# Patient Record
Sex: Female | Born: 1960
Health system: Southern US, Community
[De-identification: ages and names within clinical notes are randomized; demographics above are authoritative.]

## PROBLEM LIST (undated history)

## (undated) DIAGNOSIS — R110 Nausea: Secondary | ICD-10-CM

## (undated) DIAGNOSIS — K5790 Diverticulosis of intestine, part unspecified, without perforation or abscess without bleeding: Secondary | ICD-10-CM

## (undated) DIAGNOSIS — G4700 Insomnia, unspecified: Secondary | ICD-10-CM

## (undated) DIAGNOSIS — M2240 Chondromalacia patellae, unspecified knee: Secondary | ICD-10-CM

## (undated) DIAGNOSIS — K635 Polyp of colon: Secondary | ICD-10-CM

## (undated) DIAGNOSIS — E119 Type 2 diabetes mellitus without complications: Secondary | ICD-10-CM

## (undated) DIAGNOSIS — IMO0002 Reserved for concepts with insufficient information to code with codable children: Secondary | ICD-10-CM

## (undated) DIAGNOSIS — D126 Benign neoplasm of colon, unspecified: Secondary | ICD-10-CM

## (undated) DIAGNOSIS — E063 Autoimmune thyroiditis: Secondary | ICD-10-CM

## (undated) DIAGNOSIS — M67919 Unspecified disorder of synovium and tendon, unspecified shoulder: Secondary | ICD-10-CM

## (undated) DIAGNOSIS — G43809 Other migraine, not intractable, without status migrainosus: Secondary | ICD-10-CM

## (undated) DIAGNOSIS — G2581 Restless legs syndrome: Secondary | ICD-10-CM

## (undated) DIAGNOSIS — K219 Gastro-esophageal reflux disease without esophagitis: Secondary | ICD-10-CM

## (undated) DIAGNOSIS — M719 Bursopathy, unspecified: Secondary | ICD-10-CM

## (undated) DIAGNOSIS — T4145XA Adverse effect of unspecified anesthetic, initial encounter: Secondary | ICD-10-CM

## (undated) DIAGNOSIS — M130 Polyarthritis, unspecified: Secondary | ICD-10-CM

## (undated) DIAGNOSIS — T8859XA Other complications of anesthesia, initial encounter: Secondary | ICD-10-CM

## (undated) DIAGNOSIS — R51 Headache: Secondary | ICD-10-CM

## (undated) DIAGNOSIS — R002 Palpitations: Secondary | ICD-10-CM

## (undated) DIAGNOSIS — R569 Unspecified convulsions: Secondary | ICD-10-CM

## (undated) DIAGNOSIS — R7689 Other specified abnormal immunological findings in serum: Secondary | ICD-10-CM

## (undated) DIAGNOSIS — R000 Tachycardia, unspecified: Secondary | ICD-10-CM

## (undated) DIAGNOSIS — M7710 Lateral epicondylitis, unspecified elbow: Secondary | ICD-10-CM

## (undated) DIAGNOSIS — K900 Celiac disease: Secondary | ICD-10-CM

## (undated) DIAGNOSIS — R112 Nausea with vomiting, unspecified: Secondary | ICD-10-CM

## (undated) DIAGNOSIS — Z973 Presence of spectacles and contact lenses: Secondary | ICD-10-CM

## (undated) DIAGNOSIS — E039 Hypothyroidism, unspecified: Secondary | ICD-10-CM

## (undated) DIAGNOSIS — Z9889 Other specified postprocedural states: Secondary | ICD-10-CM

## (undated) DIAGNOSIS — Z87891 Personal history of nicotine dependence: Secondary | ICD-10-CM

## (undated) DIAGNOSIS — E7800 Pure hypercholesterolemia, unspecified: Secondary | ICD-10-CM

## (undated) DIAGNOSIS — R768 Other specified abnormal immunological findings in serum: Secondary | ICD-10-CM

## (undated) DIAGNOSIS — E669 Obesity, unspecified: Secondary | ICD-10-CM

## (undated) DIAGNOSIS — K589 Irritable bowel syndrome without diarrhea: Secondary | ICD-10-CM

## (undated) DIAGNOSIS — D649 Anemia, unspecified: Secondary | ICD-10-CM

## (undated) DIAGNOSIS — I10 Essential (primary) hypertension: Secondary | ICD-10-CM

## (undated) DIAGNOSIS — K317 Polyp of stomach and duodenum: Secondary | ICD-10-CM

## (undated) HISTORY — DX: Polyp of stomach and duodenum: K31.7

## (undated) HISTORY — DX: Unspecified disorder of synovium and tendon, unspecified shoulder: M67.919

## (undated) HISTORY — DX: Anemia, unspecified: D64.9

## (undated) HISTORY — DX: Restless legs syndrome: G25.81

## (undated) HISTORY — PX: TONSILLECTOMY: SUR1361

## (undated) HISTORY — DX: Obesity, unspecified: E66.9

## (undated) HISTORY — DX: Bursopathy, unspecified: M71.9

## (undated) HISTORY — DX: Tachycardia, unspecified: R00.0

## (undated) HISTORY — DX: Chondromalacia patellae, unspecified knee: M22.40

## (undated) HISTORY — DX: Polyarthritis, unspecified: M13.0

## (undated) HISTORY — DX: Gastro-esophageal reflux disease without esophagitis: K21.9

## (undated) HISTORY — DX: Benign neoplasm of colon, unspecified: D12.6

## (undated) HISTORY — DX: Headache: R51

## (undated) HISTORY — DX: Other migraine, not intractable, without status migrainosus: G43.809

## (undated) HISTORY — DX: Diverticulosis of intestine, part unspecified, without perforation or abscess without bleeding: K57.90

## (undated) HISTORY — PX: ABDOMINOPLASTY: SUR9

## (undated) HISTORY — DX: Reserved for concepts with insufficient information to code with codable children: IMO0002

## (undated) HISTORY — DX: Lateral epicondylitis, unspecified elbow: M77.10

## (undated) HISTORY — DX: Palpitations: R00.2

## (undated) HISTORY — DX: Pure hypercholesterolemia, unspecified: E78.00

## (undated) HISTORY — DX: Hypothyroidism, unspecified: E03.9

## (undated) HISTORY — DX: Essential (primary) hypertension: I10

## (undated) HISTORY — DX: Insomnia, unspecified: G47.00

## (undated) HISTORY — DX: Personal history of nicotine dependence: Z87.891

## (undated) HISTORY — DX: Type 2 diabetes mellitus without complications: E11.9

## (undated) HISTORY — DX: Irritable bowel syndrome, unspecified: K58.9

---
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
## 1989-07-25 HISTORY — PX: REDUCTION MAMMAPLASTY: SUR839

## 1990-07-25 HISTORY — PX: BREAST BIOPSY: SHX20

## 1995-07-26 DIAGNOSIS — D126 Benign neoplasm of colon, unspecified: Secondary | ICD-10-CM

## 1995-07-26 HISTORY — DX: Benign neoplasm of colon, unspecified: D12.6

## 2001-05-17 ENCOUNTER — Encounter: Payer: Self-pay | Admitting: Infectious Diseases

## 2001-05-17 ENCOUNTER — Ambulatory Visit (HOSPITAL_COMMUNITY): Admission: RE | Admit: 2001-05-17 | Discharge: 2001-05-17 | Payer: Self-pay | Admitting: Infectious Diseases

## 2001-06-14 ENCOUNTER — Emergency Department (HOSPITAL_COMMUNITY): Admission: EM | Admit: 2001-06-14 | Discharge: 2001-06-14 | Payer: Self-pay | Admitting: Emergency Medicine

## 2001-06-20 ENCOUNTER — Emergency Department (HOSPITAL_COMMUNITY): Admission: EM | Admit: 2001-06-20 | Discharge: 2001-06-20 | Payer: Self-pay | Admitting: Emergency Medicine

## 2001-09-05 ENCOUNTER — Other Ambulatory Visit: Admission: RE | Admit: 2001-09-05 | Discharge: 2001-09-05 | Payer: Self-pay | Admitting: Obstetrics and Gynecology

## 2001-09-07 ENCOUNTER — Ambulatory Visit (HOSPITAL_COMMUNITY): Admission: RE | Admit: 2001-09-07 | Discharge: 2001-09-07 | Payer: Self-pay | Admitting: Gastroenterology

## 2001-09-07 ENCOUNTER — Encounter: Payer: Self-pay | Admitting: Gastroenterology

## 2001-12-23 HISTORY — PX: POLYPECTOMY: SHX149

## 2001-12-23 HISTORY — PX: COLONOSCOPY: SHX174

## 2001-12-23 HISTORY — PX: ESOPHAGOGASTRODUODENOSCOPY: SHX1529

## 2001-12-31 ENCOUNTER — Encounter (INDEPENDENT_AMBULATORY_CARE_PROVIDER_SITE_OTHER): Payer: Self-pay | Admitting: *Deleted

## 2001-12-31 ENCOUNTER — Ambulatory Visit (HOSPITAL_COMMUNITY): Admission: RE | Admit: 2001-12-31 | Discharge: 2001-12-31 | Payer: Self-pay | Admitting: Gastroenterology

## 2001-12-31 ENCOUNTER — Encounter: Payer: Self-pay | Admitting: Family Medicine

## 2002-12-24 ENCOUNTER — Encounter: Payer: Self-pay | Admitting: Gastroenterology

## 2002-12-24 ENCOUNTER — Ambulatory Visit (HOSPITAL_COMMUNITY): Admission: RE | Admit: 2002-12-24 | Discharge: 2002-12-24 | Payer: Self-pay | Admitting: Gastroenterology

## 2002-12-24 HISTORY — PX: OTHER SURGICAL HISTORY: SHX169

## 2003-12-17 ENCOUNTER — Ambulatory Visit (HOSPITAL_COMMUNITY): Admission: RE | Admit: 2003-12-17 | Discharge: 2003-12-17 | Payer: Self-pay | Admitting: Orthopedic Surgery

## 2003-12-24 HISTORY — PX: POPLITEAL SYNOVIAL CYST EXCISION: SUR555

## 2004-03-16 ENCOUNTER — Encounter: Payer: Self-pay | Admitting: Family Medicine

## 2004-03-16 LAB — CONVERTED CEMR LAB: TSH: 3.64 microintl units/mL

## 2005-01-29 ENCOUNTER — Emergency Department: Payer: Self-pay | Admitting: Emergency Medicine

## 2005-01-29 ENCOUNTER — Other Ambulatory Visit: Payer: Self-pay

## 2006-03-13 ENCOUNTER — Ambulatory Visit: Payer: Self-pay | Admitting: Family Medicine

## 2007-03-20 ENCOUNTER — Encounter: Payer: Self-pay | Admitting: Family Medicine

## 2007-03-20 DIAGNOSIS — K219 Gastro-esophageal reflux disease without esophagitis: Secondary | ICD-10-CM | POA: Insufficient documentation

## 2007-03-20 DIAGNOSIS — Z860100 Personal history of colon polyps, unspecified: Secondary | ICD-10-CM | POA: Insufficient documentation

## 2007-03-20 DIAGNOSIS — E669 Obesity, unspecified: Secondary | ICD-10-CM | POA: Insufficient documentation

## 2007-03-20 DIAGNOSIS — I10 Essential (primary) hypertension: Secondary | ICD-10-CM | POA: Insufficient documentation

## 2007-03-20 DIAGNOSIS — K589 Irritable bowel syndrome without diarrhea: Secondary | ICD-10-CM | POA: Insufficient documentation

## 2007-03-20 DIAGNOSIS — K7689 Other specified diseases of liver: Secondary | ICD-10-CM | POA: Insufficient documentation

## 2007-03-20 DIAGNOSIS — D649 Anemia, unspecified: Secondary | ICD-10-CM | POA: Insufficient documentation

## 2007-03-20 DIAGNOSIS — R000 Tachycardia, unspecified: Secondary | ICD-10-CM | POA: Insufficient documentation

## 2007-03-20 DIAGNOSIS — Z87891 Personal history of nicotine dependence: Secondary | ICD-10-CM | POA: Insufficient documentation

## 2007-03-23 ENCOUNTER — Ambulatory Visit: Payer: Self-pay | Admitting: Family Medicine

## 2007-03-23 DIAGNOSIS — G4700 Insomnia, unspecified: Secondary | ICD-10-CM | POA: Insufficient documentation

## 2007-03-23 DIAGNOSIS — M2550 Pain in unspecified joint: Secondary | ICD-10-CM | POA: Insufficient documentation

## 2007-03-23 DIAGNOSIS — R002 Palpitations: Secondary | ICD-10-CM | POA: Insufficient documentation

## 2007-03-24 ENCOUNTER — Encounter: Payer: Self-pay | Admitting: Family Medicine

## 2007-03-28 LAB — CONVERTED CEMR LAB: Anti Nuclear Antibody(ANA): NEGATIVE

## 2007-03-30 ENCOUNTER — Ambulatory Visit: Payer: Self-pay | Admitting: Cardiology

## 2007-04-16 ENCOUNTER — Telehealth (INDEPENDENT_AMBULATORY_CARE_PROVIDER_SITE_OTHER): Payer: Self-pay | Admitting: *Deleted

## 2007-04-26 ENCOUNTER — Telehealth (INDEPENDENT_AMBULATORY_CARE_PROVIDER_SITE_OTHER): Payer: Self-pay | Admitting: *Deleted

## 2007-05-24 ENCOUNTER — Telehealth (INDEPENDENT_AMBULATORY_CARE_PROVIDER_SITE_OTHER): Payer: Self-pay | Admitting: *Deleted

## 2007-05-25 ENCOUNTER — Ambulatory Visit: Payer: Self-pay | Admitting: Family Medicine

## 2007-05-25 DIAGNOSIS — E7800 Pure hypercholesterolemia, unspecified: Secondary | ICD-10-CM | POA: Insufficient documentation

## 2007-05-28 LAB — CONVERTED CEMR LAB
ALT: 18 units/L (ref 0–35)
AST: 18 units/L (ref 0–37)
HDL: 41.7 mg/dL (ref >39.0)
TSH: 0.41 microintl units/mL (ref 0.35–5.50)
Triglycerides: 67 mg/dL (ref 0–149)
VLDL: 13 mg/dL (ref 0–40)

## 2007-07-03 ENCOUNTER — Telehealth (INDEPENDENT_AMBULATORY_CARE_PROVIDER_SITE_OTHER): Payer: Self-pay | Admitting: *Deleted

## 2007-08-03 ENCOUNTER — Telehealth (INDEPENDENT_AMBULATORY_CARE_PROVIDER_SITE_OTHER): Payer: Self-pay | Admitting: *Deleted

## 2007-08-24 ENCOUNTER — Telehealth: Payer: Self-pay | Admitting: Family Medicine

## 2007-11-12 ENCOUNTER — Telehealth: Payer: Self-pay | Admitting: Family Medicine

## 2007-11-23 HISTORY — PX: ESOPHAGOGASTRODUODENOSCOPY: SHX1529

## 2007-11-27 ENCOUNTER — Ambulatory Visit: Payer: Self-pay | Admitting: Family Medicine

## 2007-11-28 ENCOUNTER — Telehealth (INDEPENDENT_AMBULATORY_CARE_PROVIDER_SITE_OTHER): Payer: Self-pay | Admitting: Internal Medicine

## 2008-01-01 ENCOUNTER — Ambulatory Visit: Payer: Self-pay | Admitting: Internal Medicine

## 2008-02-05 ENCOUNTER — Ambulatory Visit: Payer: Self-pay | Admitting: Family Medicine

## 2008-02-05 ENCOUNTER — Encounter (INDEPENDENT_AMBULATORY_CARE_PROVIDER_SITE_OTHER): Payer: Self-pay | Admitting: Internal Medicine

## 2008-02-13 ENCOUNTER — Ambulatory Visit: Payer: Self-pay

## 2008-02-13 ENCOUNTER — Encounter: Payer: Self-pay | Admitting: Cardiology

## 2008-02-21 ENCOUNTER — Telehealth (INDEPENDENT_AMBULATORY_CARE_PROVIDER_SITE_OTHER): Payer: Self-pay | Admitting: Internal Medicine

## 2008-03-11 ENCOUNTER — Telehealth (INDEPENDENT_AMBULATORY_CARE_PROVIDER_SITE_OTHER): Payer: Self-pay | Admitting: *Deleted

## 2008-04-09 ENCOUNTER — Encounter: Payer: Self-pay | Admitting: Family Medicine

## 2008-04-16 ENCOUNTER — Ambulatory Visit: Payer: Self-pay | Admitting: Family Medicine

## 2008-04-28 ENCOUNTER — Telehealth: Payer: Self-pay | Admitting: Family Medicine

## 2008-06-30 ENCOUNTER — Telehealth: Payer: Self-pay | Admitting: Family Medicine

## 2008-07-03 ENCOUNTER — Ambulatory Visit: Payer: Self-pay | Admitting: Family Medicine

## 2008-07-03 DIAGNOSIS — M130 Polyarthritis, unspecified: Secondary | ICD-10-CM | POA: Insufficient documentation

## 2008-07-03 DIAGNOSIS — Z780 Asymptomatic menopausal state: Secondary | ICD-10-CM | POA: Insufficient documentation

## 2008-07-11 ENCOUNTER — Telehealth: Payer: Self-pay | Admitting: Family Medicine

## 2008-07-15 ENCOUNTER — Telehealth: Payer: Self-pay | Admitting: Family Medicine

## 2008-08-18 ENCOUNTER — Telehealth: Payer: Self-pay | Admitting: Family Medicine

## 2008-08-20 ENCOUNTER — Telehealth: Payer: Self-pay | Admitting: Family Medicine

## 2008-08-21 ENCOUNTER — Encounter: Payer: Self-pay | Admitting: Family Medicine

## 2008-08-26 ENCOUNTER — Ambulatory Visit: Payer: Self-pay | Admitting: Cardiology

## 2008-09-17 ENCOUNTER — Ambulatory Visit: Payer: Self-pay | Admitting: Family Medicine

## 2008-09-17 DIAGNOSIS — R42 Dizziness and giddiness: Secondary | ICD-10-CM | POA: Insufficient documentation

## 2008-09-18 ENCOUNTER — Ambulatory Visit: Payer: Self-pay | Admitting: Family Medicine

## 2008-09-29 LAB — CONVERTED CEMR LAB
AST: 21 units/L (ref 0–37)
Chloride: 104 meq/L (ref 96–112)
Cholesterol: 227 mg/dL (ref 0–200)
Creatinine, Ser: 0.8 mg/dL (ref 0.4–1.2)
Direct LDL: 156.9 mg/dL
GFR calc non Af Amer: 82 mL/min
Hgb A1c MFr Bld: 6.5 % — ABNORMAL HIGH (ref 4.6–6.0)
Potassium: 4 meq/L (ref 3.5–5.1)
Total Bilirubin: 0.6 mg/dL (ref 0.3–1.2)
Total CHOL/HDL Ratio: 5.4
VLDL: 25 mg/dL (ref 0–40)

## 2008-11-06 ENCOUNTER — Telehealth: Payer: Self-pay | Admitting: Family Medicine

## 2008-11-25 ENCOUNTER — Telehealth: Payer: Self-pay | Admitting: Family Medicine

## 2008-12-12 ENCOUNTER — Telehealth: Payer: Self-pay | Admitting: Family Medicine

## 2008-12-17 ENCOUNTER — Telehealth (INDEPENDENT_AMBULATORY_CARE_PROVIDER_SITE_OTHER): Payer: Self-pay | Admitting: *Deleted

## 2009-02-06 ENCOUNTER — Telehealth: Payer: Self-pay | Admitting: Family Medicine

## 2009-03-11 ENCOUNTER — Telehealth: Payer: Self-pay | Admitting: Family Medicine

## 2009-05-05 ENCOUNTER — Ambulatory Visit: Payer: Self-pay | Admitting: Family Medicine

## 2009-05-05 DIAGNOSIS — M67919 Unspecified disorder of synovium and tendon, unspecified shoulder: Secondary | ICD-10-CM | POA: Insufficient documentation

## 2009-05-05 DIAGNOSIS — M719 Bursopathy, unspecified: Secondary | ICD-10-CM

## 2009-05-05 DIAGNOSIS — M7710 Lateral epicondylitis, unspecified elbow: Secondary | ICD-10-CM | POA: Insufficient documentation

## 2009-05-14 ENCOUNTER — Telehealth: Payer: Self-pay | Admitting: Cardiology

## 2009-05-14 ENCOUNTER — Telehealth: Payer: Self-pay | Admitting: Family Medicine

## 2009-05-18 ENCOUNTER — Telehealth: Payer: Self-pay | Admitting: Cardiology

## 2009-05-20 ENCOUNTER — Ambulatory Visit: Payer: Self-pay | Admitting: Family Medicine

## 2009-05-20 DIAGNOSIS — Z8669 Personal history of other diseases of the nervous system and sense organs: Secondary | ICD-10-CM | POA: Insufficient documentation

## 2009-06-30 ENCOUNTER — Ambulatory Visit: Payer: Self-pay | Admitting: Family Medicine

## 2009-06-30 DIAGNOSIS — M2240 Chondromalacia patellae, unspecified knee: Secondary | ICD-10-CM | POA: Insufficient documentation

## 2009-06-30 DIAGNOSIS — IMO0002 Reserved for concepts with insufficient information to code with codable children: Secondary | ICD-10-CM | POA: Insufficient documentation

## 2009-07-06 ENCOUNTER — Telehealth: Payer: Self-pay | Admitting: Family Medicine

## 2009-07-16 ENCOUNTER — Telehealth: Payer: Self-pay | Admitting: Family Medicine

## 2009-07-27 ENCOUNTER — Ambulatory Visit: Payer: Self-pay | Admitting: Family Medicine

## 2009-07-27 ENCOUNTER — Encounter (INDEPENDENT_AMBULATORY_CARE_PROVIDER_SITE_OTHER): Payer: Self-pay | Admitting: *Deleted

## 2009-09-21 ENCOUNTER — Encounter (INDEPENDENT_AMBULATORY_CARE_PROVIDER_SITE_OTHER): Payer: Self-pay | Admitting: *Deleted

## 2009-09-22 ENCOUNTER — Encounter: Payer: Self-pay | Admitting: Family Medicine

## 2009-10-19 ENCOUNTER — Ambulatory Visit: Payer: Self-pay | Admitting: Gastroenterology

## 2009-10-19 ENCOUNTER — Telehealth: Payer: Self-pay | Admitting: Gastroenterology

## 2009-11-10 ENCOUNTER — Ambulatory Visit: Payer: Self-pay | Admitting: Internal Medicine

## 2009-11-12 ENCOUNTER — Telehealth: Payer: Self-pay | Admitting: Family Medicine

## 2009-11-12 LAB — CONVERTED CEMR LAB
AST: 22 units/L (ref 0–37)
Albumin: 3.7 g/dL (ref 3.5–5.2)
Alkaline Phosphatase: 66 units/L (ref 39–117)
Basophils Relative: 0.6 % (ref 0.0–3.0)
Bilirubin, Direct: 0 mg/dL (ref 0.0–0.3)
Chloride: 104 meq/L (ref 96–112)
Eosinophils Relative: 4.7 % (ref 0.0–5.0)
GFR calc non Af Amer: 81 mL/min (ref >60)
Hemoglobin: 13 g/dL (ref 12.0–15.0)
Lymphocytes Relative: 21.6 % (ref 12.0–46.0)
Monocytes Relative: 8.9 % (ref 3.0–12.0)
Neutro Abs: 5.7 10*3/uL (ref 1.4–7.7)
Phosphorus: 3.5 mg/dL (ref 2.3–4.6)
Potassium: 3.7 meq/L (ref 3.5–5.1)
RBC: 4.48 M/uL (ref 3.87–5.11)
Sodium: 142 meq/L (ref 135–145)
TSH: 0.84 microintl units/mL (ref 0.35–5.50)
Total Protein: 6.3 g/dL (ref 6.0–8.3)
WBC: 8.9 10*3/uL (ref 4.5–10.5)

## 2009-11-19 ENCOUNTER — Telehealth: Payer: Self-pay | Admitting: Family Medicine

## 2009-11-26 ENCOUNTER — Telehealth: Payer: Self-pay | Admitting: Gastroenterology

## 2009-11-26 ENCOUNTER — Encounter: Payer: Self-pay | Admitting: Gastroenterology

## 2009-11-27 ENCOUNTER — Ambulatory Visit: Payer: Self-pay | Admitting: Gastroenterology

## 2009-11-27 LAB — HM COLONOSCOPY

## 2009-12-02 ENCOUNTER — Encounter: Payer: Self-pay | Admitting: Gastroenterology

## 2009-12-25 ENCOUNTER — Ambulatory Visit: Payer: Self-pay | Admitting: Vascular Surgery

## 2010-01-18 ENCOUNTER — Telehealth: Payer: Self-pay | Admitting: Gastroenterology

## 2010-01-18 DIAGNOSIS — R110 Nausea: Secondary | ICD-10-CM | POA: Insufficient documentation

## 2010-01-19 ENCOUNTER — Ambulatory Visit: Payer: Self-pay | Admitting: Family Medicine

## 2010-01-19 ENCOUNTER — Telehealth (INDEPENDENT_AMBULATORY_CARE_PROVIDER_SITE_OTHER): Payer: Self-pay | Admitting: *Deleted

## 2010-01-19 LAB — CONVERTED CEMR LAB
AST: 31 units/L (ref 0–37)
Albumin: 4.1 g/dL (ref 3.5–5.2)
Alkaline Phosphatase: 67 units/L (ref 39–117)
Basophils Absolute: 0.1 10*3/uL (ref 0.0–0.1)
Basophils Relative: 0.8 % (ref 0.0–3.0)
CO2: 28 meq/L (ref 19–32)
GFR calc non Af Amer: 77.57 mL/min (ref >60)
Glucose, Bld: 123 mg/dL — ABNORMAL HIGH (ref 70–99)
HCT: 40.3 % (ref 36.0–46.0)
Hemoglobin: 13.5 g/dL (ref 12.0–15.0)
Lymphocytes Relative: 28.5 % (ref 12.0–46.0)
Lymphs Abs: 1.8 10*3/uL (ref 0.7–4.0)
MCHC: 33.5 g/dL (ref 30.0–36.0)
Monocytes Relative: 10.7 % (ref 3.0–12.0)
Neutro Abs: 3.7 10*3/uL (ref 1.4–7.7)
Potassium: 4.2 meq/L (ref 3.5–5.1)
RBC: 4.72 M/uL (ref 3.87–5.11)
RDW: 14.4 % (ref 11.5–14.6)
Sodium: 140 meq/L (ref 135–145)
TSH: 1.1 microintl units/mL (ref 0.35–5.50)
Total CHOL/HDL Ratio: 5
Total Protein: 6.7 g/dL (ref 6.0–8.3)

## 2010-01-21 ENCOUNTER — Telehealth: Payer: Self-pay | Admitting: Gastroenterology

## 2010-01-26 ENCOUNTER — Other Ambulatory Visit: Admission: RE | Admit: 2010-01-26 | Discharge: 2010-01-26 | Payer: Self-pay | Admitting: Family Medicine

## 2010-01-26 ENCOUNTER — Ambulatory Visit: Payer: Self-pay | Admitting: Family Medicine

## 2010-01-26 DIAGNOSIS — E119 Type 2 diabetes mellitus without complications: Secondary | ICD-10-CM | POA: Insufficient documentation

## 2010-01-26 DIAGNOSIS — M79609 Pain in unspecified limb: Secondary | ICD-10-CM | POA: Insufficient documentation

## 2010-01-26 LAB — CONVERTED CEMR LAB: Pap Smear: NORMAL

## 2010-01-26 LAB — HM PAP SMEAR

## 2010-02-01 ENCOUNTER — Encounter: Payer: Self-pay | Admitting: Family Medicine

## 2010-02-17 ENCOUNTER — Telehealth: Payer: Self-pay | Admitting: Gastroenterology

## 2010-04-26 ENCOUNTER — Telehealth: Payer: Self-pay | Admitting: Family Medicine

## 2010-04-30 ENCOUNTER — Encounter (INDEPENDENT_AMBULATORY_CARE_PROVIDER_SITE_OTHER): Payer: Self-pay | Admitting: *Deleted

## 2010-05-03 ENCOUNTER — Telehealth: Payer: Self-pay | Admitting: Family Medicine

## 2010-05-05 ENCOUNTER — Telehealth: Payer: Self-pay | Admitting: Family Medicine

## 2010-05-10 ENCOUNTER — Ambulatory Visit: Payer: Self-pay | Admitting: Internal Medicine

## 2010-05-11 ENCOUNTER — Ambulatory Visit: Payer: Self-pay | Admitting: Family Medicine

## 2010-05-25 HISTORY — PX: OTHER SURGICAL HISTORY: SHX169

## 2010-06-14 ENCOUNTER — Encounter: Payer: Self-pay | Admitting: Family Medicine

## 2010-06-14 ENCOUNTER — Ambulatory Visit: Payer: Self-pay | Admitting: Internal Medicine

## 2010-06-15 LAB — CONVERTED CEMR LAB
Alkaline Phosphatase: 78 units/L (ref 39–117)
BUN: 18 mg/dL (ref 6–23)
Basophils Absolute: 0.1 10*3/uL (ref 0.0–0.1)
Basophils Relative: 0.7 % (ref 0.0–3.0)
Bilirubin, Direct: 0.1 mg/dL (ref 0.0–0.3)
CO2: 27 meq/L (ref 19–32)
Calcium: 9.1 mg/dL (ref 8.4–10.5)
Chloride: 102 meq/L (ref 96–112)
Creatinine, Ser: 0.8 mg/dL (ref 0.4–1.2)
Eosinophils Absolute: 0.3 10*3/uL (ref 0.0–0.7)
Glucose, Bld: 119 mg/dL — ABNORMAL HIGH (ref 70–99)
Lipase: 44 units/L (ref 11.0–59.0)
Lymphocytes Relative: 25.8 % (ref 12.0–46.0)
MCHC: 34 g/dL (ref 30.0–36.0)
MCV: 85.4 fL (ref 78.0–100.0)
Monocytes Absolute: 0.7 10*3/uL (ref 0.1–1.0)
Neutrophils Relative %: 61 % (ref 43.0–77.0)
Platelets: 229 10*3/uL (ref 150.0–400.0)
RDW: 14.2 % (ref 11.5–14.6)
Total Protein: 6.2 g/dL (ref 6.0–8.3)

## 2010-08-15 ENCOUNTER — Encounter: Payer: Self-pay | Admitting: Gastroenterology

## 2010-08-19 ENCOUNTER — Ambulatory Visit
Admission: RE | Admit: 2010-08-19 | Discharge: 2010-08-19 | Payer: Self-pay | Source: Home / Self Care | Attending: Family Medicine | Admitting: Family Medicine

## 2010-08-19 DIAGNOSIS — G2581 Restless legs syndrome: Secondary | ICD-10-CM | POA: Insufficient documentation

## 2010-08-19 DIAGNOSIS — G43109 Migraine with aura, not intractable, without status migrainosus: Secondary | ICD-10-CM | POA: Insufficient documentation

## 2010-08-20 ENCOUNTER — Telehealth: Payer: Self-pay | Admitting: Family Medicine

## 2010-08-22 LAB — CONVERTED CEMR LAB
ALT: 18 units/L (ref 0–35)
AST: 19 units/L (ref 0–37)
Albumin: 3.5 g/dL (ref 3.5–5.2)
Alkaline Phosphatase: 61 units/L (ref 39–117)
Alkaline Phosphatase: 64 units/L (ref 39–117)
Basophils Absolute: 0.1 10*3/uL (ref 0.0–0.1)
Basophils Relative: 0.7 % (ref 0.0–1.0)
Bilirubin, Direct: 0.1 mg/dL (ref 0.0–0.3)
Bilirubin, Direct: 0.1 mg/dL (ref 0.0–0.3)
Eosinophils Absolute: 0.3 10*3/uL (ref 0.0–0.7)
Eosinophils Absolute: 0.4 10*3/uL (ref 0.0–0.6)
Eosinophils Relative: 5.6 % — ABNORMAL HIGH (ref 0.0–5.0)
GFR calc Af Amer: 115 mL/min
GFR calc non Af Amer: 95 mL/min
HCT: 34.1 % — ABNORMAL LOW (ref 36.0–46.0)
HCT: 38.4 % (ref 36.0–46.0)
Hemoglobin: 11 g/dL — ABNORMAL LOW (ref 12.0–15.0)
Lymphocytes Relative: 25.1 % (ref 12.0–46.0)
MCHC: 32.2 g/dL (ref 30.0–36.0)
MCV: 72 fL — ABNORMAL LOW (ref 78.0–100.0)
MCV: 79 fL (ref 78.0–100.0)
Monocytes Absolute: 0.5 10*3/uL (ref 0.2–0.7)
Monocytes Absolute: 0.6 10*3/uL (ref 0.1–1.0)
Monocytes Relative: 7.3 % (ref 3.0–11.0)
Neutro Abs: 4.5 10*3/uL (ref 1.4–7.7)
Neutrophils Relative %: 61.3 % (ref 43.0–77.0)
Platelets: 189 10*3/uL (ref 150–400)
Platelets: 232 10*3/uL (ref 150–400)
Potassium: 4 meq/L (ref 3.5–5.1)
RBC: 4.74 M/uL (ref 3.87–5.11)
RDW: 15.1 % — ABNORMAL HIGH (ref 11.5–14.6)
RDW: 17.3 % — ABNORMAL HIGH (ref 11.5–14.6)
Rheumatoid fact SerPl-aCnc: <20 intl units/mL — ABNORMAL LOW (ref 0.0–20.0)
Rhuematoid fact SerPl-aCnc: <20 intl units/mL — ABNORMAL LOW (ref 0.0–20.0)
Saturation Ratios: 7 % — ABNORMAL LOW (ref 20.0–50.0)
Sed Rate: 4 mm/hr (ref 0–22)
Sed Rate: 9 mm/hr (ref 0–25)
Sodium: 139 meq/L (ref 135–145)
TSH: 0.19 microintl units/mL — ABNORMAL LOW (ref 0.35–5.50)
TSH: 0.66 microintl units/mL (ref 0.35–5.50)
Total Bilirubin: 0.4 mg/dL (ref 0.3–1.2)
Total Bilirubin: 0.6 mg/dL (ref 0.3–1.2)
Total Protein: 6.3 g/dL (ref 6.0–8.3)
WBC: 7.4 10*3/uL (ref 4.5–10.5)

## 2010-08-24 NOTE — Progress Notes (Signed)
Summary: speak to nurse   Phone Note Call from Patient Call back at Work Phone [phone] Call back at [phone]   Caller: Patient Call For: Russella Dar Reason for Call: Talk to Nurse Summary of Call: Patient would like to speak to nurse regarding procedure she had done. Initial call taken by: Tawni Levy,  January 18, 2010 1:25 PM  Follow-up for Phone Call        Patient  has continued nausea.  She has been on omeprazole that has helped with GERD, but she has constant nausea.  Patient is wondering if there is any additional testing to be done? Follow-up by: Darcey Nora RN, CGRN,  January 18, 2010 1:36 PM  Additional Follow-up for Phone Call Additional follow up Details #1::        Schedule GES Increase omeprazole to two times a day for 2 months REV after above Additional Follow-up by: Meryl Dare MD Clementeen Graham,  January 18, 2010 1:43 PM  New Problems: NAUSEA, CHRONIC (ICD-787.02)   Additional Follow-up for Phone Call Additional follow up Details #2::    GES scheduled for 02/02/10 9:00 at [facility], REV with Dr Russella Dar scheduled for 03/03/10 11:15 Follow-up by: Darcey Nora RN, CGRN,  January 18, 2010 2:16 PM  New Problems: NAUSEA, CHRONIC (ICD-787.02) Prescriptions: OMEPRAZOLE 40 MG CPDR (OMEPRAZOLE) one tablet by mouth once daily  #60 x 1   Entered by:   Darcey Nora RN, CGRN   Authorized by:   Meryl Dare MD [name]   Signed by:   Darcey Nora RN, CGRN on 01/18/2010   Method used:   Electronically to        [facility] (retail)       [address], [address]       [place], [place]  [address]       Ph: [phone]       Fax: [phone]   RxID:   [id number]   Appended Document: speak to nurse    Clinical Lists Changes  Medications: Rx of OMEPRAZOLE 40 MG CPDR (OMEPRAZOLE) one tablet by mouth twice daily;  #60 x 11;  Signed;  Entered by: Darcey Nora RN, CGRN;  Authorized by: Meryl Dare MD [facility];  Method used: Electronically to  [facility]  [address], [place], [place], [place]  [address], Ph: [phone], Fax: [phone]    Prescriptions: OMEPRAZOLE 40 MG CPDR (OMEPRAZOLE) one tablet by mouth twice daily  #60 x 11   Entered by:   Darcey Nora RN, CGRN   Authorized by:   Meryl Dare MD [facility]   Signed by:   Darcey Nora RN, CGRN on 02/01/2010   Method used:   Electronically to        [facility] [address] (retail)       [address], [address]       [place], [place]  [address]       Ph: [phone]       Fax: [phone]   RxID:   [id number]

## 2010-08-24 NOTE — Assessment & Plan Note (Signed)
Summary: GI ISSUES/YF   History of Present Illness Visit Type: Initial Consult Primary GI MD: Elie Goody MD [facility] Primary Provider: [facility] Requesting Provider: Shepard General Chief Complaint: Acid reflux, Hx of pre-cancerous polyps dx'd by Dr Loreta Ave History of Present Illness:   This is a 50 year old female who has a long history of GERD and a prior history of adenomatous colon polyps. She states she was due for surveillance colonoscopy in 2008 after last colonoscopy was in 2003, by Dr. Loreta Ave. She relates a change in bowel habits with occasional loose stools, soft stools and thinner stools. She has had worsening reflux symptoms over the past several months associated with an intermittent cough. She has occasional nighttime symptoms and intermittent regurgitation. She has had several episodes of nausea and vomiting as well. She has had side effects from Kapidex, Nexium and ranitidine.    GI Review of Systems    Reports acid reflux, heartburn, nausea, and  vomiting.      Denies abdominal pain, belching, bloating, chest pain, dysphagia with liquids, dysphagia with solids, loss of appetite, vomiting blood, weight loss, and  weight gain.        Denies anal fissure, black tarry stools, change in bowel habit, constipation, diarrhea, diverticulosis, fecal incontinence, heme positive stool, hemorrhoids, irritable bowel syndrome, jaundice, light color stool, liver problems, rectal bleeding, and  rectal pain.   Current Medications (verified): 1)  Synthroid 150 Mcg  Tabs (Levothyroxine Sodium) .Marland Kitchen.. 1 By Mouth Once Daily 2)  Ambien 10 Mg  Tabs (Zolpidem Tartrate) .Marland Kitchen.. 1 By Mouth At Bedtime As Needed 3)  Multivitamins   Tabs (Multiple Vitamin) .... Take 1 Tablet By Mouth Once A Day 4)  Ibuprofen 800 Mg Tabs (Ibuprofen) .... Take 1 Tablet By Mouth Two Times A Day As Needed 5)  Toprol Xl 50 Mg Xr24h-Tab (Metoprolol Succinate) .... Take 1 Tablet By Mouth Two Times A Day 6)   Lisinopril-Hydrochlorothiazide 20-25 Mg Tabs (Lisinopril-Hydrochlorothiazide) .Marland Kitchen.. 1 By Mouth Once Daily 7)  Prilosec Otc 20 Mg Tbec (Omeprazole Magnesium) .... Once A Day 8)  Lyrica 100 Mg Caps (Pregabalin) .Marland Kitchen.. 1 By Mouth Once Daily  Allergies: 1)  ! * Kapidex 2)  ! * Omeprazole 3)  ! Nexium 4)  ! * Ranitidine 5)  ! Procardia 6)  Cardizem 7)  Adalat 8)  Tegretol 9)  Verapamil  Past History:  Past Medical History: PALPITATIONS (ICD-785.1) HYPERTENSION (ICD-401.9) PURE HYPERCHOLESTEROLEMIA (ICD-272.0) GERD (ICD-530.81) DIZZINESS (ICD-780.4) POLYARTHRITIS (ICD-716.59) INSOMNIA, CHRONIC (ICD-307.42) HYPOTHYROIDISM (ICD-244.9) ANEMIA-NOS (ICD-285.9) PERIMENOPAUSAL STATUS (ICD-V49.81) CELLULITIS, GREAT TOE, RIGHT (ICD-681.10) URI (ICD-465.9) LATERAL EPICONDYLITIS, RIGHT (ICD-726.32) PAIN IN JOINT, MULTIPLE SITES (ICD-719.49) ADENOMATOUS COLON POLYPS, 1997  Past Surgical History: Reviewed history from 11/27/2007 and no changes required. Bursitis- knee, baker's cyst (12/2003) Colonoscopy- neg (2000),   polyp (12/2001) EGD- normal (12/2001) Abd [place]- fatty liver (12/2002) Baker's cyst (11/2004) 5/09EGD nl  Social History: Marital Status: Married Children: 2 daughters Tobacco Use -QUIT 2009... light smoking since age 72 Alcohol Use - yes - once a mth Regular Exercise - yes 30 mins cardio & weights Drug Use - no Occupation: Engineer, site Daily Caffeine Use 4 per day coffee and sodas  Review of Systems       The patient complains of cough, headaches-new, muscle pains/cramps, night sweats, and shortness of breath.         The pertinent positives and negatives are noted as above and in the HPI. All other ROS were reviewed and were negative.   Vital Signs:  Patient profile:   50 year old female Height:      68 inches Weight:      227 pounds BMI:     34.64 BSA:     2.16 Pulse rate:   78 / minute Pulse rhythm:   regular BP sitting:   102 / 62  (left  arm)  Vitals Entered By: Merri Ray CMA Duncan Dull) (October 19, 2009 9:12 AM)  Physical Exam  General:  Well developed, well nourished, no acute distress. obese.   Head:  Normocephalic and atraumatic. Eyes:  PERRLA, no icterus. Ears:  Normal auditory acuity. Mouth:  No deformity or lesions, dentition normal. Neck:  Supple; no masses or thyromegaly. Lungs:  Clear throughout to auscultation. Heart:  Regular rate and rhythm; no murmurs, rubs,  or bruits. Abdomen:  Soft, nontender and nondistended. No masses, hepatosplenomegaly or hernias noted. Normal bowel sounds. Rectal:  deferred until time of colonoscopy.   Msk:  Symmetrical with no gross deformities. Normal posture. Pulses:  Normal pulses noted. Extremities:  No clubbing, cyanosis, edema or deformities noted. Neurologic:  Alert and  oriented x4;  grossly normal neurologically. Cervical Nodes:  No significant cervical adenopathy. Inguinal Nodes:  No significant inguinal adenopathy. Psych:  Alert and cooperative. Normal mood and affect.  Impression & Recommendations:  Problem # 1:  GERD (ICD-530.81) Refractory reflux symptoms. Intensify all antireflux measures. Change omeprazole to 40 mg q.a.m. The risks, benefits and alternatives to endoscopy with possible biopsy and possible dilation were discussed with the patient and they consent to proceed. The procedure will be scheduled electively. Orders: Colon/Endo (Colon/Endo)  Problem # 2:  COLONIC POLYPS, HX OF (ICD-V12.72) Personal history of adenomatous colon polyps, initially diagnosed in 1997. The risks, benefits and alternatives to colonoscopy with possible biopsy and possible polypectomy were discussed with the patient and they consent to proceed. The procedure will be scheduled electively. Orders: Colon/Endo (Colon/Endo)  Problem # 3:  CHANGE IN BOWELS (UVO-536.64) See problem #3. Increase fiber and fluid intake. Orders: Colon/Endo (Colon/Endo)  Patient Instructions: 1)   Pick up your prescription from your pharmacy.  2)  Colonoscopy brochure given.  3)  Upper Endoscopy brochure given.  4)  Avoid foods high in acid content ( tomatoes, citrus juices, spicy foods) . Avoid eating within 3 to 4 hours of lying down or before exercising. Do not over eat; try smaller more frequent meals. Elevate head of bed four inches when sleeping.  5)  Copy sent to : Roxy Manns, MD 6)  The medication list was reviewed and reconciled.  All changed / newly prescribed medications were explained.  A complete medication list was provided to the patient / caregiver.  Prescriptions: OMEPRAZOLE 40 MG CPDR (OMEPRAZOLE) one tablet by mouth once daily  #34 x 11   Entered by:   Christie Nottingham CMA (AAMA)   Authorized by:   Meryl Dare MD [facility]   Signed by:   Meryl Dare MD FACG on 10/19/2009   Method used:   Electronically to        [facility] (retail)       [address]       [place], [place]  [address]       Ph: [phone]       Fax: [phone]   RxID:   [id number] MOVIPREP 100 GM  SOLR (PEG-KCL-NACL-NASULF-NA ASC-C) As per prep instructions.  #1 x 0   Entered by:  Christie Nottingham CMA (AAMA)   Authorized by:   Meryl Dare MD [facility]   Signed by:   Meryl Dare MD FACG on 10/19/2009   Method used:   Electronically to        [facility]ail)       [address], [address]       [place], [place]  [address]       Ph: [phone]       Fax: [phone]   RxID:   [id number]   Appended Document: GI ISSUES/YF    Clinical Lists Changes  Allergies: Removed allergy or adverse reaction of * OMEPRAZOLE Removed allergy or adverse reaction of NEXIUM Removed allergy or adverse reaction of * RANITIDINE

## 2010-08-24 NOTE — Letter (Signed)
Summary: Patient Notice- Polyp Results   Gastroenterology  [address] [place], [place] [address]   Phone: [phone]  Fax: [phone]        Dec 02, 2009 MRN: [id number]    [facility] [place] [address] [place], [place]  [address]    Dear Sarah Chaney,  I am pleased to inform you that the colon polyp(s) removed during your recent colonoscopy was (were) found to be benign (no cancer detected) upon pathologic examination.  I recommend you have a repeat colonoscopy examination in 5 years to look for recurrent polyps, as having colon polyps increases your risk for having recurrent polyps or even colon cancer in the future.  Should you develop new or worsening symptoms of abdominal pain, bowel habit changes or bleeding from the rectum or bowels, please schedule an evaluation with either your primary care physician or with me.  Continue treatment plan as outlined the day of your exam.  Please call us if you are having persistent problems or have questions about your condition that have not been fully answered at this time.  Sincerely,  Sarah Dare MD [facility]  This letter has been electronically signed by your physician.  Appended Document: Patient Notice- Polyp Results letter mailed

## 2010-08-24 NOTE — Op Note (Signed)
Summary: Colonoscopy and biopsy                    [place]  Patient:    Sarah Chaney, Sarah Chaney Visit Number: [id number] MRN: [id number]          Service Type: END Location: ENDO Attending Physician:  Sarah Chaney Dictated by:   Sarah Chaney, M.D. Proc. Date: 12/31/01 Admit Date:  12/31/2001 Discharge Date: 12/31/2001   CC:         Sarah Chaney, M.D. [facility]  Sarah Chaney, M.D.   Operative Report  DATE OF BIRTH:  [birth year]  PROCEDURE PERFORMED:  Colonoscopy with cold biopsies x2 endoscopy.  ENDOSCOPIST:  Sarah Chaney, M.D.  INSTRUMENT USED:  Olympus pediatric colonoscope.  INDICATION FOR PROCEDURE:  A 50 year old white female with a history of adenomatous polyps removed over six years ago, rule out recurrent polyps.  PREPROCEDURE PREPARATION:  Informed consent was procured from Sarah patient. Sarah patient fasted for eight hours prior to Sarah procedure and prepped with a bottle of magnesium citrate and a gallon of NuLytely Sarah night prior to Sarah procedure.  PREPROCEDURE PHYSICAL:  VITAL SIGNS:  Stable.  NECK:  Supple.  CHEST:  Clear to auscultation. S1, S2 regular.  ABDOMEN:  Soft with normal bowel sounds.  DESCRIPTION OF PROCEDURE:  Sarah patient was placed in Sarah left lateral decubitus position, sedated with Demerol and Versed for Sarah EGD. No additional sedation was used for Sarah colonoscopy. Once Sarah patient was adequately sedated, maintained on low-flow oxygen and continuous cardiac monitoring, Sarah pediatric Olympus colonoscope was advanced from Sarah rectum to Sarah cecum with slight difficulty. There was some residual stool in Sarah right colon. Sarah appendiceal orifice and Sarah ileocecal valve were clearly visualized. At 4 oclock a small sessile polyp was removed from 10 cm with cold biopsy forceps. Small internal hemorrhoids were seen on retroflexion of Sarah rectum. Sarah terminal ileum appeared normal and healthy. No other masses, polyps,  or diverticular disease was identified.  IMPRESSION: 1. Essentially normal colonoscopy up to Sarah terminal ileum except for small    sessile polyp biopsied from 10 cm. 2. Small nonbleeding internal hemorrhoid.  RECOMMENDATIONS: 1. Await pathology results. 2. Outpatient follow up for further recommendations. Dictated by:   Sarah Chaney, M.D. Attending Physician:  Sarah Chaney DD:  01/01/02 TD:  01/02/02 Job: [id number]          SP Surgical Pathology - STATUS: Final             By: Almyra Free MD , Sarah Chaney        Perform Date: 9 Jun03 07:18  Ordered By: Sarah Chaney  ,           Ordered Date:  Facility: [facility]                              Department: CPATH  Service Report Text  Sarah Chaney. [facility]   [address]   [place], [place] [address]   [phone]    REPORT OF SURGICAL PATHOLOGY    Case #: [id number]   Patient Name: Sarah Chaney, Sarah Chaney   PID: [id number]   Pathologist: Sarah Rue L. Almyra Free, MD   DOB/Age Apr 08, 1961 (Age: 35) Gender: F   Date Taken: 12/31/2001   Date Received: 12/31/2001    FINAL DIAGNOSIS    ***MICROSCOPIC EXAMINATION AND DIAGNOSIS***  ENDOSCOPIC BIOPSY, COLON AT 10 CM: THREE FRAGMENTS OF   HYPERPLASTIC POLYP.    jn   Date Reported: 01/01/2002 Sarah L. Almyra Free, MD   *** Electronically Signed Out By ALL ***    Clinical information   (wf)    specimen(s) obtained   Colon, polyp(s), at 10cm    Gross Description   Received in formalin are tan, soft tissue fragments that are   submitted in toto. Number:2   Size: each 0.1 cm. (SW:jn, 12/31/01)    jn/

## 2010-08-24 NOTE — Assessment & Plan Note (Signed)
Summary: COUGH,LOSING VOICE/CLE   Vital Signs:  Patient profile:   50 year old female Weight:      225 pounds Temp:     98.1 degrees F oral BP sitting:   120 / 70  (left arm) Cuff size:   regular  Vitals Entered By: Mervin Hack CMA Duncan Dull) (November 10, 2009 1:10 PM) CC: cough   History of Present Illness: Has laryngiitis and cough for a week Cough now steady after being on and off for a while some choking sensation before that and then got sick Does have bad acid problems--omeprazole recently increased  Fever only one night 3 days ago cough is mostly dry Occ AM mucus Slight ear pain  Slight sore throat but no trouble swallowing missed one day of work--after being sent home early the day before  No SOB but has heavy sensation  Allergies: 1)  ! * Kapidex 2)  ! Procardia 3)  Cardizem 4)  Adalat 5)  Tegretol 6)  Verapamil  Past History:  Past medical, surgical, family and social histories (including risk factors) reviewed for relevance to current acute and chronic problems.  Past Medical History: HYPERTENSION (ICD-401.9) PURE HYPERCHOLESTEROLEMIA (ICD-272.0) GERD (ICD-530.81) POLYARTHRITIS (ICD-716.59) INSOMNIA, CHRONIC (ICD-307.42) HYPOTHYROIDISM (ICD-244.9) ANEMIA-NOS (ICD-285.9) PERIMENOPAUSAL STATUS (ICD-V49.81) PAIN IN JOINT, MULTIPLE SITES (ICD-719.49) ADENOMATOUS COLON POLYPS, 1997  Past Surgical History: Reviewed history from 11/27/2007 and no changes required. Bursitis- knee, baker's cyst (12/2003) Colonoscopy- neg (2000),   polyp (12/2001) EGD- normal (12/2001) Abd [place]- fatty liver (12/2002) Baker's cyst (11/2004) 5/09EGD nl  Family History: Reviewed history from 03/20/2007 and no changes required. Father: deceased age 2- DM type 2 Mother: well Siblings: 1 brother, 1 sister. 1 sister died in MVA  Social History: Reviewed history from 10/19/2009 and no changes required. Marital Status: Married Children: 2 daughters Tobacco Use -QUIT  2009... 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  NAD Freq coarse cough voice is hoarse Ears:  R ear normal and L ear normal.   Nose:  mild congestion Mouth:  no erythema and no exudates.   Neck:  supple, no masses, and no cervical lymphadenopathy.   Lungs:  normal respiratory effort, normal breath sounds, no crackles, and no wheezes.     Impression & Recommendations:  Problem # 1:  LARYNGITIS, ACUTE (ICD-464.00) Assessment New seems like infection--though seems to have chronic LPR discussed viral etiology  will give Rx for cough she will call if becomes productive or worsens  Problem # 2:  HYPERTENSION (ICD-401.9) Assessment: Unchanged  doing okay overdue for labs  Her updated medication list for this problem includes:    Toprol Xl 50 Mg Xr24h-tab (Metoprolol succinate) .Marland Kitchen... Take 1 tablet by mouth two times a day    Lisinopril-hydrochlorothiazide 20-25 Mg Tabs (Lisinopril-hydrochlorothiazide) .Marland Kitchen... 1 by mouth once daily  BP today: 120/70 Prior BP: 102/62 (10/19/2009)  Labs Reviewed: K+: 4.0 (09/18/2008) Creat: : 0.8 (09/18/2008)   Chol: 227 (09/18/2008)   HDL: 41.7 (09/18/2008)   LDL: DEL (09/18/2008)   TG: 126 (09/18/2008)  Orders: TLB-Renal Function Panel (80069-RENAL) TLB-CBC Platelet - w/Differential (85025-CBCD) TLB-Hepatic/Liver Function Pnl (80076-HEPATIC) TLB-TSH (Thyroid Stimulating Hormone) (84443-TSH) Venipuncture ([address])  Complete Medication List: 1)  Synthroid  150 Mcg Tabs (Levothyroxine sodium) .Marland Kitchen.. 1 by mouth once daily 2)  Ambien 10 Mg Tabs (Zolpidem tartrate) .Marland Kitchen.. 1 by mouth at bedtime as needed 3)  Multivitamins Tabs (Multiple vitamin) .... Take 1 tablet by mouth once a day 4)  Ibuprofen 800 Mg  Tabs (Ibuprofen) .... Take 1 tablet by mouth two times a day as needed 5)  Toprol Xl 50 Mg Xr24h-tab (Metoprolol succinate) .... Take 1 tablet by mouth two times a day 6)  Lisinopril-hydrochlorothiazide 20-25 Mg Tabs (Lisinopril-hydrochlorothiazide) .Marland Kitchen.. 1 by mouth once daily 7)  Omeprazole 40 Mg Cpdr (Omeprazole) .... One tablet by mouth once daily 8)  Lyrica 100 Mg Caps (Pregabalin) .Marland Kitchen.. 1 by mouth once daily 9)  Moviprep 100 Gm Solr (Peg-kcl-nacl-nasulf-na asc-c) .... As per prep instructions. 10)  Tramadol Hcl 50 Mg Tabs (Tramadol hcl) .Marland Kitchen.. 1 tab at bedtime as needed for severe cough  Other Orders: TLB-T4 (Thyrox), Free [phone])  Patient Instructions: 1)  Please schedule a follow-up appointment in 3-4  months with Dr Milinda Antis Prescriptions: TRAMADOL HCL 50 MG TABS (TRAMADOL HCL) 1 tab at bedtime as needed for severe cough  #20 x 0   Entered and Authorized by:   Cindee Salt MD   Signed by:   Cindee Salt MD on 11/10/2009   Method used:   Electronically to        [facility] (retail)       [address], [address]       [place], [place]  [address]       Ph: [phone]       Fax: [phone]   RxID:   [id number]   Current Allergies (reviewed today): ! * KAPIDEX ! PROCARDIA CARDIZEM ADALAT TEGRETOL VERAPAMIL

## 2010-08-24 NOTE — Letter (Signed)
Summary: [place] No Show Letter  [place] at [facility]  [address] [place], [place] [address]   Phone: [phone]  Fax: [phone]    04/30/2010 MRN: [id number]  [facility] [place] [address] [place], [place]  [address]   Dear Ms. Einar Gip,   Our records indicate that you missed your scheduled appointment with _____Lab________________ on _10.6.11_________.  Please contact this office to reschedule your appointment as soon as possible.  It is important that you keep your scheduled appointments with your physician, so we can provide you the best care possible.  Please be advised that there may be a charge for "no show" appointments.    Sincerely,   [place] [facility]

## 2010-08-24 NOTE — Procedures (Signed)
Summary: Upper Endoscopy  Patient: Sarah Chaney Note: All result statuses are Final unless otherwise noted.  Tests: (1) Upper Endoscopy (EGD)   EGD Upper Endoscopy       DONE     [facility]     [address]     [place], [place]  [address]           ENDOSCOPY PROCEDURE REPORT           PATIENT:  Sarah Chaney, Sarah Chaney  MR#:  [id number]     BIRTHDATE:  Dec 03, 1960, 49 yrs. old  GENDER:  female     ENDOSCOPIST:  Judie Petit T. Russella Dar, MD, [facility]     Referred by:  Marne A. Milinda Antis, M.D.     PROCEDURE DATE:  11/27/2009     PROCEDURE:  EGD with biopsy     ASA CLASS:  Class II     INDICATIONS:  GERD     MEDICATIONS:  There was residual sedation effect present from     prior procedure, Fentanyl 50 mcg IV, Versed 5 mg IV     TOPICAL ANESTHETIC:  Exactacain Spray     DESCRIPTION OF PROCEDURE:   After the risks benefits and     alternatives of the procedure were thoroughly explained, informed     consent was obtained.  The LB [id number] endoscope was     introduced through the mouth and advanced to the second portion of     the duodenum, without limitations.  The instrument was slowly     withdrawn as the mucosa was fully examined.     <<PROCEDUREIMAGES>>           The esophagus and gastroesophageal junction were completely normal     in appearance. Five polyps were found in the body of the stomach.     They were smooth, soft and round. They were 4 - 5 mm in size.     Multiple biopsies were obtained fo the two largest polyps and sent     to pathology.  Duodenitis was found in the bulb of the duodenum.     It was patchy and erythematous.  Otherwise the examination was     normal. Retroflexed views revealed no abnormalities. The scope was     then withdrawn from the patient and the procedure completed.     COMPLICATIONS:  None           ENDOSCOPIC IMPRESSION:     1) 4 - 5 mm, five polyps in the body of the stomach     2) Duodenitis in the bulb of duodenum     RECOMMENDATIONS:     1)  Anti-reflux regimen     2) PPI qam long term           Rosario Duey T. Russella Dar, MD, [facility]           n.     eSIGNED:   Venita Lick. Aubrianna Orchard at 11/27/2009 02:57 PM           [place], [place], [id number]  Note: An exclamation mark (!) indicates a result that was not dispersed into the flowsheet. Document Creation Date: 11/27/2009 2:57 PM _______________________________________________________________________  (1) Order result status: Final Collection or observation date-time: 11/27/2009 14:49 Requested date-time:  Receipt date-time:  Reported date-time:  Referring Physician:   Ordering Physician: Claudette Head [phone]) Specimen Source:  Source: Launa Grill Order Number: [id number] Lab site:

## 2010-08-24 NOTE — Procedures (Signed)
Summary: Instruction for procedure/[facility]  Instruction for procedure/[facility]   Imported By: Sherian Rein 11/27/2009 12:16:33  _____________________________________________________________________  External Attachment:    Type:   Image     Comment:   External Document

## 2010-08-24 NOTE — Letter (Signed)
Summary: Out of Work  [facility] [employer] [facility] at [facility]  [address] [place], [place] [address]   Phone: [phone]  Fax: [phone]    July 27, 2009   Employee:  ELIZABEHT SUTO    To Whom It May Concern:   For Medical reasons, please excuse the above named employee from work for the following dates:  Start:   07-27-2008  End:   May return 07-28-2008  If you need additional information, please feel free to contact our office.         Sincerely,    Kerby Nora MD

## 2010-08-24 NOTE — Procedures (Signed)
Summary: Colonoscopy  Patient: Sarah Chaney Note: All result statuses are Final unless otherwise noted.  Tests: (1) Colonoscopy (COL)   COL Colonoscopy           DONE     [facility]     [address]     [place], [place]  [address]           COLONOSCOPY PROCEDURE REPORT           PATIENT:  [name], [name]  MR#:  [id number]     BIRTHDATE:  Dec 11, 1960, 49 yrs. old  GENDER:  female     ENDOSCOPIST:  Judie Petit T. Russella Dar, MD, [facility]     Referred by:  Marne A. Milinda Antis, M.D.     PROCEDURE DATE:  11/27/2009     PROCEDURE:  Colonoscopy with biopsy, and with hot biopsy     ASA CLASS:  Class II     INDICATIONS:  follow-up of polyp, surveillance and high-risk     screening: adenomatous polyp, 1997.     MEDICATIONS:   Fentanyl 125 mcg IV, Versed 12 mg IV     DESCRIPTION OF PROCEDURE:   After the risks benefits and     alternatives of the procedure were thoroughly explained, informed     consent was obtained.  Digital rectal exam was performed and     revealed no abnormalities.   The LB [id number]-[id number] endoscope     was introduced through the anus and advanced to the cecum, which     was identified by both the appendix and ileocecal valve, without     limitations.  The quality of the prep was excellent, using     MoviPrep.  The instrument was then slowly withdrawn as the colon     was fully examined.     <<PROCEDUREIMAGES>>           FINDINGS:  A sessile polyp was found in the mid transverse colon.     It was 4 mm in size. The polyp was removed using cold biopsy     forceps.  A sessile polyp was found in the descending colon. It     was 3 mm in size. The polyp was removed using cold biopsy forceps.     Four polyps were found in the sigmoid colon. They were 4 - 5 mm in     size. With hot biopsy forceps, the polyps were cauterized,     biopsies obtained and sent to pathology.  Mild diverticulosis was     found in the sigmoid colon.  This was otherwise a normal     examination of the colon.    Retroflexed views in the rectum     revealed no abnormalities.    The time to cecum =  2.25  minutes.     The scope was then withdrawn (time =  12  min) from the patient     and the procedure completed.           COMPLICATIONS:  None           ENDOSCOPIC IMPRESSION:     1) 4 mm sessile polyp in the mid transverse colon     2) 3 mm sessile polyp in the descending colon     3) 4 - 5 mm, four polyps in the sigmoid colon     4) Mild diverticulosis in the sigmoid colon           RECOMMENDATIONS:  1) No aspirin or NSAID's for 2 weeks     2) Await pathology results     3) High fiber diet with liberal fluid intake.     4) Repeat Colonoscopy in 5 years.           Venita Lick. Russella Dar, MD, [facility]           n.     eSIGNED:   Venita Lick. Anapaula Severt at 11/27/2009 02:43 PM           [place], [place], [id number]  Note: An exclamation mark (!) indicates a result that was not dispersed into the flowsheet. Document Creation Date: 11/27/2009 2:43 PM _______________________________________________________________________  (1) Order result status: Final Collection or observation date-time: 11/27/2009 14:36 Requested date-time:  Receipt date-time:  Reported date-time:  Referring Physician:   Ordering Physician: Claudette Head [phone]) Specimen Source:  Source: Launa Grill Order Number: [id number] Lab site:   Appended Document: Colonoscopy     Procedures Next Due Date:    Colonoscopy: 11/2014

## 2010-08-24 NOTE — Progress Notes (Signed)
Summary: Unable to afford prep meds  Medications Added NULYTELY WITH FLAVOR PACKS 420 GM SOLR (PEG 3350-KCL-NA BICARB-NACL) as per prep instructions       Phone Note Call from Patient Call back at Work Phone [phone] Call back at [phone]   Call For: Dr Russella Dar Reason for Call: Talk to Nurse Summary of Call: ECL this friday. Prep was over $60 and she can not afford it. Initial call taken by: Leanor Kail [facility],  Nov 26, 2009 8:14 AM  Follow-up for Phone Call        Pt states the prep is too expensive and would like a cheaper alternative prep. Pt informed she could do [facility] prep instead. Pt agreed and new prep has been sent to her pharmacy.  Follow-up by: Christie Nottingham CMA Duncan Dull),  Nov 26, 2009 8:37 AM    New/Updated Medications: NULYTELY WITH FLAVOR PACKS 420 GM SOLR (PEG 3350-KCL-NA BICARB-NACL) as per prep instructions Prescriptions: NULYTELY WITH FLAVOR PACKS 420 GM SOLR (PEG 3350-KCL-NA BICARB-NACL) as per prep instructions  #1 x 0   Entered by:   Christie Nottingham CMA (AAMA)   Authorized by:   Meryl Dare MD [facility]   Signed by:   Christie Nottingham CMA (AAMA) on 11/26/2009   Method used:   Electronically to        [facility] (retail)       [address], [address]       [place], [place]  [address]       Ph: [phone]       Fax: [phone]   RxID:   [id number]

## 2010-08-24 NOTE — Letter (Signed)
Summary: Results Follow up Letter  [place] [facility]  [address] [place], [place] [address]   Phone: [phone]  Fax: [phone]    02/01/2010 MRN: [id number]    [facility] [address] [place], [place]  [address]    Dear Ms. Einar Gip,  The following are the results of your recent test(s):  Test         Result    Pap Smear:        Normal __X___  Not Normal _____ Comments: ______________________________________________________ Cholesterol: LDL(Bad cholesterol):         Your goal is less than:         HDL (Good cholesterol):       Your goal is more than: Comments:  ______________________________________________________ Mammogram:        Normal _____  Not Normal _____ Comments:  ___________________________________________________________________ Hemoccult:        Normal _____  Not normal _______ Comments:    _____________________________________________________________________ Other Tests:    We routinely do not discuss normal results over the telephone.  If you desire a copy of the results, or you have any questions about this information we can discuss them at your next office visit.   Sincerely,    Idamae Schuller Keilyn Haggard,MD  MT/ri

## 2010-08-24 NOTE — Assessment & Plan Note (Signed)
Summary: STOMACH PAIN,LOW SUGAR/CLE   Vital Signs:  Patient profile:   50 year old female Weight:      221.75 pounds Temp:     98.0 degrees F oral Pulse rate:   72 / minute Pulse rhythm:   regular BP sitting:   124 / 74  (right arm) Cuff size:   large  Vitals Entered By: Selena Batten Dance CMA (AAMA) (June 14, 2010 9:15 AM) CC: Abd pain after eating and low sugars   History of Present Illness: CC: abd pain, low bs?  1 mo h/o noticing "low sugars".  Hasn't checked sugars.  Gets heart pounding, shakey, anxious, diaphoretic, feels in fog.  Comes home and eats something.  takes some time to feel back to normal.  Checked sugars this weekend, initially "too low" then after candy up to 116, then down to 46.  no new foods, meds, no vitamins supplements/herbs. strong family hx diabetes.  getting feeling of hypoglycemia every day.  Doesn't usually happen in AM, happens more in afternoons, can even happen after eating a meal.  rarely eats breakfast.  eats good lunch, makes soup for dinner.  Drinks diet sodas.  usually gets 2 meals/day.  2d ago had episode of severe abd pain after eating.  Pain described as horrible stomach pains, sharp.  Radiates to back.  + nausea but no vomiting.  No fevers/chills, diarrhea or constipation.  No blood in urine or stool, no dysuria, no frequency or urgency.  no weight changes.  + more gassy than normal.  no noted change in abd pain with different foods.  + h/o IBS and GERD.   + h/o EGD/colonoscopy 3 mo ago, told gastric polyps, colon polyps, they wanted to do gastric emptying study but pt never got around to it.  omeprazole 2/day better.  GI Lynett Fish.  this am had coffee with creamer/sweet&low   Current Medications (verified): 1)  Synthroid 150 Mcg  Tabs (Levothyroxine Sodium) .Marland Kitchen.. 1 By Mouth Once Daily 2)  Ambien 10 Mg  Tabs (Zolpidem Tartrate) .Marland Kitchen.. 1 By Mouth At Bedtime As Needed 3)  Multivitamins   Tabs (Multiple Vitamin) .... Take 1 Tablet By Mouth Once A  Day 4)  Ibuprofen 800 Mg Tabs (Ibuprofen) .... Take 1 Tablet By Mouth Three Times A Day As Needed With Food 5)  Toprol Xl 50 Mg Xr24h-Tab (Metoprolol Succinate) .... Take 1 Tablet By Mouth Two Times A Day 6)  Lisinopril-Hydrochlorothiazide 20-25 Mg Tabs (Lisinopril-Hydrochlorothiazide) .Marland Kitchen.. 1 By Mouth Once Daily 7)  Omeprazole 40 Mg Cpdr (Omeprazole) .... One Tablet By Mouth Twice Daily  Allergies: 1)  ! * Kapidex 2)  ! 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 1 sister died in MVA neice DM  Social History: Last updated: 2010/02/13 Marital Status: Married Children: 2 daughters Tobacco Use -QUIT 2009... light smoking since age 41 Alcohol Use - yes - once a mth Regular Exercise - yes 30 mins cardio & weights Drug Use - no Occupation: Engineer, site Daily Caffeine Use 4 per day coffee and sodas helps care for her grandchild  works at [employer] and  vein  quit smoking   Review of Systems       per HPI  Physical Exam  General:  overweight but generally well appearing  Head:  normocephalic, atraumatic, and no abnormalities observed.   Mouth:  pharynx pink and moist.   Neck:  supple with full rom and no masses or thyromegally, no JVD or carotid bruit  Lungs:  Normal respiratory effort, chest expands symmetrically. Lungs are clear to auscultation, no crackles or wheezes. Heart:  Normal rate and regular rhythm. S1 and S2 normal without gallop,  murmur, click, rub or other extra sounds. Abdomen:  Bowel sounds positive,abdomen soft and non-tender without masses, organomegaly or hernias noted. no abd or renal bruits, no guarding, rebound. Pulses:  2+ rad pulses Extremities:  no pedal edema Neurologic:  CN grossly intact, sensation intact to light touch, station/gait normal   Impression & Recommendations:  Problem # 1:  ABDOMINAL PAIN, EPIGASTRIC (ICD-789.06) unclear etiology.  h/o IBS and GERD.  some description of sxs makes me think pancreatitis, however not ill enough to have this.  very intermittent.  Could be gastroparesis but no h/o DM, actually prediabetic last check.  check lipase and other baseline labs..  check abd [place] for RUQ eval as well as possibly eval pancreas (although will likely be difficult w/ body habitus).  some bloating/gas, nausea, but not concentrated on RUQ, not worse with fatty meals per patient.  If gallstones, would recommend watch diet and monitor fatty meals.  Orders: TLB-BMP (Basic Metabolic Panel-BMET) (80048-METABOL) TLB-CBC Platelet - w/Differential (85025-CBCD) TLB-Hepatic/Liver Function Pnl (80076-HEPATIC) TLB-TSH (Thyroid Stimulating Hormone) (84443-TSH) TLB-Lipase (83690-LIPASE) Radiology Referral (Radiology)  Problem # 2:  ? of HYPOGLYCEMIA (ICD-251.2) h/o DM in family, personal h/o prediabetes.  advised importance of regular meals/day along with healthy food choices.  Complete Medication List: 1)  Synthroid 150 Mcg Tabs (Levothyroxine sodium) .Marland Kitchen.. 1 by mouth once daily 2)  Ambien 10 Mg Tabs (Zolpidem tartrate) .Marland Kitchen.. 1 by mouth at bedtime as needed 3)  Multivitamins Tabs (Multiple vitamin) .... Take 1 tablet by mouth once a day 4)  Ibuprofen 800 Mg Tabs (Ibuprofen) .... Take 1 tablet by mouth three times a day as needed with food 5)  Toprol Xl 50 Mg Xr24h-tab (Metoprolol succinate) .... Take 1 tablet by mouth two times a day 6)  Lisinopril-hydrochlorothiazide 20-25 Mg Tabs  (Lisinopril-hydrochlorothiazide) .Marland Kitchen.. 1 by mouth once daily 7)  Omeprazole 40 Mg Cpdr (Omeprazole) .... One tablet by mouth twice daily  Patient Instructions: 1)  Return next week for follow up. 2)  Keep checking sugars when you feel low.   3)  Blood work today. 4)  We will set you up with abdominal ultrasound as well.  Pass by Marion's office today 5)  Good to see you today.   Orders Added: 1)  TLB-BMP (Basic Metabolic Panel-BMET) [80048-METABOL] 2)  TLB-CBC Platelet - w/Differential [85025-CBCD] 3)  TLB-Hepatic/Liver Function Pnl [80076-HEPATIC] 4)  TLB-TSH (Thyroid Stimulating Hormone) [84443-TSH] 5)  TLB-Lipase [83690-LIPASE] 6)  Radiology Referral [Radiology] 7)  Est. Patient Level IV [54098]    Current Allergies (reviewed today): ! * KAPIDEX ! PROCARDIA CARDIZEM ADALAT TEGRETOL VERAPAMIL

## 2010-08-24 NOTE — Letter (Signed)
Summary: Patient Notice-Endo Biopsy Results  [facility]  [address] [place], [place] [address]   Phone: [phone]  Fax: [phone]        Dec 02, 2009 MRN: [id number]    [facility] [address] [place], [place]  [address]    Dear Ms. Einar Gip,  I am pleased to inform you that the biopsies taken during your recent endoscopic examination did not show any evidence of cancer upon pathologic examination. The biopsies showed benign fundic gland polyps.  Continue with the treatment plan as outlined on the day of your      exam.  Please call us if you are having persistent problems or have questions about your condition that have not been fully answered at this time.  Sincerely,  Meryl Dare MD [facility]  This letter has been electronically signed by your physician.  Appended Document: Patient Notice-Endo Biopsy Results letter mailed

## 2010-08-24 NOTE — Assessment & Plan Note (Signed)
Summary: YEARLY PHYSICAL/JRR   Vital Signs:  Patient profile:   50 year old female Height:      68 inches Weight:      221.25 pounds BMI:     33.76 Temp:     98 degrees F oral Pulse rate:   76 / minute Pulse rhythm:   regular BP sitting:   116 / 78  (left arm) Cuff size:   regular  Vitals Entered By: Lewanda Rife LPN (January 27, 8412 10:33 AM) CC: CPX with pap and breast exam LMP 06/25/2008   History of Present Illness: here for health mt exam and gyn care and to rev prior med problems   has been feeling ok  dealing with hot flashes - no menses in 2 y   was worked up for leg pain -- saw Dr Patsy Lager and also had vascular work up  like a toothache in legs all the time  ? 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 (has inc omeprazole to 40 two times a day ) - also getting an EGD  is interested in ortho consult   doing home exercises  has a pool - swims and bikes regularly now   watches her grandchild-- moving to [place]   wt is down 4 lb with bmi 33-- wants to loose more  has hx of fatty liver- fairly stable    lipids up with trig 151 and HDL 46 and LDL 168 (up from 159)  hypothyroid - stable / theraputic tsh   gluc up at 123 diet is fair -- cannot eat breakfast , eats lunch and dinner sticks for heathly foods for the most part  mainly eats chicken and [place]  too much potato and pasta  lot of veg   pap- last was 3 years   mam per pt 2/10-- wants to schedule herself  self exam   colonosc 5/11 nl -- due 5 y for surv of polyps   ? Td   Allergies: 1)  ! * Kapidex 2)  ! 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 1 sister died in MVA neice DM  Social History: Marital Status: Married Children: 2 daughters Tobacco Use -QUIT 2009... light smoking since age 70 Alcohol Use - yes - once a mth Regular Exercise - yes 30 mins cardio & weights Drug Use - no Occupation: Engineer, site Daily Caffeine Use 4 per day coffee and sodas helps care for her grandchild  works at [employer] and vein  quit smoking   Review of Systems General:  Denies fatigue, fever, loss of appetite, and malaise. Eyes:  Denies blurring, eye irritation, and eye pain. CV:  Denies chest pain or discomfort, lightheadness, and palpitations. Resp:  Denies cough, shortness of breath, and wheezing. GI:  Denies abdominal pain, bloody stools, change in bowel habits, and indigestion. GU:  Denies abnormal vaginal bleeding, discharge, dysuria, and urinary frequency. MS:  Denies joint pain, joint redness, and joint swelling. Derm:  Denies lesion(s), poor wound healing, and rash.  Neuro:  Denies numbness and tingling. Psych:  Denies anxiety and depression; mood is generally good . Endo:  Denies cold intolerance, excessive thirst, excessive urination, and heat intolerance. Heme:  Denies abnormal bruising and bleeding.  Physical Exam  General:  overweight but generally well appearing  Head:  normocephalic, atraumatic, and no abnormalities observed.   Eyes:  vision grossly intact, pupils equal, pupils round, and pupils reactive to light.  no conjunctival pallor, injection or icterus  Ears:  R ear normal and L ear normal.   Nose:  no nasal discharge.   Mouth:  pharynx pink and moist.   Neck:  supple with full rom and no masses or thyromegally, no JVD or carotid bruit  Chest Wall:  No deformities, masses, or tenderness  noted. Breasts:  No mass, nodules, thickening, tenderness, bulging, retraction, inflamation, nipple discharge or skin changes noted.  - scars noted from prev breast reduction Lungs:  Normal respiratory effort, chest expands symmetrically. Lungs are clear to auscultation, no crackles or wheezes. Heart:  Normal rate and regular rhythm. S1 and S2 normal without gallop, murmur, click, rub or other extra sounds. Abdomen:  Bowel sounds positive,abdomen soft and non-tender without masses, organomegaly or hernias noted. no renal bruits  Genitalia:  Normal introitus for age, no external lesions, no vaginal discharge, mucosa pink and moist, no vaginal or cervical lesions, no vaginal atrophy, no friaility or hemorrhage, normal uterus size and position, no adnexal masses or tenderness Msk:  No deformity or scoliosis noted of thoracic or lumbar spine.  poor rom hips - some pain on ext rot R hip  Pulses:  R and L carotid,radial,femoral,dorsalis pedis and posterior tibial pulses are full and equal bilaterally Extremities:  No clubbing, cyanosis, edema, or deformity noted with normal full range of motion of all joints.   no palp cords or tender areas Neurologic:  sensation intact to light touch, gait normal, and DTRs symmetrical and normal.   Skin:  Intact without suspicious lesions or rashes lentigos diffusely  Cervical Nodes:  No lymphadenopathy noted Axillary Nodes:  No palpable lymphadenopathy Inguinal Nodes:  No significant adenopathy Psych:  normal affect, talkative and pleasant    Impression & Recommendations:  Problem # 1:  HEALTH MAINTENANCE EXAM (ICD-V70.0) Assessment Comment Only  reviewed health habits including diet, exercise and skin cancer prevention reviewed health maintenance list and family history disc need for wt loss for sugar control and better chol rev labs in detail Td today  Orders: Prescription Created Electronically [phone])  Problem # 2:  ROUTINE GYNECOLOGICAL EXAMINATION  (ICD-V72.31) Assessment: Comment Only  annual exam and pap  is tolerating menopause symptoms  Orders: Prescription Created Electronically [phone])  Problem # 3:  HYPERTENSION (ICD-401.9) Assessment: Unchanged  bp remains in good control with current meds  Her updated medication list for this problem includes:    Toprol Xl 50 Mg Xr24h-tab (Metoprolol succinate) .Marland Kitchen... Take 1 tablet by mouth two times a day    Lisinopril-hydrochlorothiazide 20-25 Mg Tabs (Lisinopril-hydrochlorothiazide) .Marland Kitchen... 1 by mouth once daily  BP today: 116/78 Prior BP: 120/70 (11/10/2009)  Labs Reviewed: K+: 4.2 (01/19/2010) Creat: : 0.8 (01/19/2010)   Chol: 231 (01/19/2010)   HDL: 46.40 (01/19/2010)   LDL: DEL (09/18/2008)   TG: 151.0 (01/19/2010)  Orders: Prescription Created Electronically [phone])  Problem # 4:  PURE HYPERCHOLESTEROLEMIA (ICD-272.0) Assessment: Deteriorated  chol is high- disc goals for LDL - under 130 or better rev low sat fat diet re check in 3 mo and if not imp --  consider statin  aware of CV risks of high chol   Labs Reviewed: SGOT: 31 (01/19/2010)   SGPT: 38 (01/19/2010)   HDL:46.40 (01/19/2010), 41.7 (09/18/2008)  LDL:DEL (09/18/2008), DEL (05/25/2007)  Chol:231 (01/19/2010), 227 (09/18/2008)  Trig:151.0 (01/19/2010), 126 (09/18/2008)  Orders: Prescription Created Electronically [phone])  Problem # 5:  HYPOTHYROIDISM (ICD-244.9) Assessment: Unchanged  clinically stable with theraputic tsh  no change in med  Her updated medication list for this problem includes:    Synthroid 150 Mcg Tabs (Levothyroxine sodium) .Marland Kitchen... 1 by mouth once daily  Labs Reviewed: TSH: 1.10 (01/19/2010)    HgBA1c: 6.5 (09/18/2008) Chol: 231 (01/19/2010)   HDL: 46.40 (01/19/2010)   LDL: DEL (09/18/2008)   TG: 151.0 (01/19/2010)  Orders: Prescription Created Electronically [phone])  Problem # 6:  HYPERGLYCEMIA (ICD-790.29) Assessment: Deteriorated  with high fasting sugar of 123 disc healthy  diet (low simple sugar/ choose complex carbs/ low sat fat) diet and exercise in detail  check AIC 3 mo and f/u  given handout aafp on DM and nutrition   Labs Reviewed: Creat: 0.8 (01/19/2010)     Orders: Prescription Created Electronically [phone])  Problem # 7:  LEG PAIN, BILATERAL (ICD-729.5) Assessment: New ongoing pain that may be radicular  worse on R  ref to ortho continue ibup if GI tolerated  Orders: Orthopedic Referral (Ortho) Prescription Created Electronically [phone])  Complete Medication List: 1)  Synthroid 150 Mcg Tabs (Levothyroxine sodium) .Marland Kitchen.. 1 by mouth once daily 2)  Ambien 10 Mg Tabs (Zolpidem tartrate) .Marland Kitchen.. 1 by mouth at bedtime as needed 3)  Multivitamins Tabs (Multiple vitamin) .... Take 1 tablet by mouth once a day 4)  Ibuprofen 800 Mg Tabs (Ibuprofen) .... Take 1 tablet by mouth three times a day as needed with food 5)  Toprol Xl 50 Mg Xr24h-tab (Metoprolol succinate) .... Take 1 tablet by mouth two times a day 6)  Lisinopril-hydrochlorothiazide 20-25 Mg Tabs (Lisinopril-hydrochlorothiazide) .Marland Kitchen.. 1 by mouth once daily 7)  Omeprazole 40 Mg Cpdr (Omeprazole) .... One tablet by mouth twice daily  Other Orders: TD Toxoids IM 7 YR + ([id number]) Admin 1st Vaccine (62952)  Patient Instructions: 1)  don't forget to schedule your mammogram- you are overdue 2)  we will refer you to orthopedics at check out for leg pain 3)  you can raise your HDL (good cholesterol) by increasing exercise and eating omega 3 fatty acid supplement like fish oil or flax seed oil over the counter 4)  you can lower LDL (bad cholesterol) by limiting saturated fats in diet like red meat, fried foods, egg yolks, fatty breakfast meats, high fat dairy products and shellfish 5)  work on diet low in simple sugars  6)  work on weight loss  7)  schedule fasting labs in 3 mo lipids/ast/alt/ AIC for 272 and hyperglycemia and then folow up to review labs  Prescriptions: LISINOPRIL-HYDROCHLOROTHIAZIDE  20-25 MG TABS (LISINOPRIL-HYDROCHLOROTHIAZIDE) 1 by mouth once daily  #30 x 11   Entered and Authorized by:   Judith Part MD   Signed by:   Judith Part MD on 01/26/2010   Method used:   Electronically to        [facility] [address] (retail)       [address], [address]       [place], [place]  [address]       Ph: [phone]       Fax: [phone]   RxID:  [id number] TOPROL XL 50 MG XR24H-TAB (METOPROLOL SUCCINATE) Take 1 tablet by mouth two times a day  #60 x 11   Entered and Authorized by:   Judith Part MD   Signed by:   Judith Part MD on 01/26/2010   Method used:   Electronically to        [facility] [address] (retail)       [address], [address]       [place], [place]  [address]       Ph: [phone]       Fax: [phone]   RxID:   [id number] IBUPROFEN 800 MG TABS (IBUPROFEN) Take 1 tablet by mouth three times a day as needed with food  #90 x 2   Entered and Authorized by:   Judith Part MD   Signed by:   Judith Part MD on 01/26/2010   Method used:   Electronically to        [facility] [address] (retail)       [address], [address]       [place], [place]  [address]       Ph: [phone]       Fax: [phone]   RxID:   [id number] SYNTHROID 150 MCG  TABS (LEVOTHYROXINE SODIUM) 1 by mouth once daily  #30 x 11   Entered and Authorized by:   Judith Part MD   Signed by:   Judith Part MD on 01/26/2010   Method used:   Electronically to        [facility] [address] (retail)       [address], [address]       [place], [place]  [address]       Ph: [phone]       Fax: [phone]   RxID:   [id number]   Current Allergies (reviewed today): ! * KAPIDEX ! PROCARDIA CARDIZEM ADALAT TEGRETOL VERAPAMIL    Immunizations Administered:  Tetanus Vaccine:    Vaccine Type: Td    Site:  left deltoid    Mfr: Sanofi Pasteur    Dose: 0.5 ml    Route: IM    Given by: Lewanda Rife LPN    Exp. Date: 08/20/2011    Lot #: [id number]    VIS given: 06/12/07 version given January 26, 2010.

## 2010-08-24 NOTE — Letter (Signed)
Summary: New Patient letter  [facility]  [address] [place], [place] [address]   Phone: [phone]  Fax: [phone]       09/21/2009 MRN: [id number]  [facility] [place] [address] [place], [place]  [address]  Dear Ms. Einar Gip,  Welcome to the Gastroenterology Division at [facility].    You are scheduled to see Dr.   Leone Payor on 10-27-09 at 8:45AM on the 3rd floor at [facility], [address]  We ask that you try to arrive at our office 15 minutes prior to your appointment time to allow for check-in.  We would like you to complete the enclosed self-administered evaluation form prior to your visit and bring it with you on the day of your appointment.  We will review it with you.  Also, please bring a complete list of all your medications or, if you prefer, bring the medication bottles and we will list them.  Please bring your insurance card so that we may make a copy of it.  If your insurance requires a referral to see a specialist, please bring your referral form from your primary care physician.  Co-payments are due at the time of your visit and may be paid by cash, check or credit card.     Your office visit will consist of a consult with your physician (includes a physical exam), any laboratory testing he/she may order, scheduling of any necessary diagnostic testing (e.g. x-ray, ultrasound, CT-scan), and scheduling of a procedure (e.g. Endoscopy, Colonoscopy) if required.  Please allow enough time on your schedule to allow for any/all of these possibilities.    If you cannot keep your appointment, please call [phone] to cancel or reschedule prior to your appointment date.  This allows [place] the opportunity to schedule an appointment for another patient in need of care.  If you do not cancel or reschedule by 5 p.m. the business day prior to your appointment date, you will be charged a $50.00 late cancellation/no-show fee.    Thank you for choosing [facility] for your medical needs.  We appreciate the opportunity to care for you.  Please visit [place] at our website  to learn more about our practice.                     Sincerely,                                                             The Gastroenterology Division

## 2010-08-24 NOTE — Progress Notes (Signed)
Summary: Zolpidem 10mg  rx  Phone Note Refill Request Call back at [phone] Message from:  Rema Fendt on November 12, 2009 10:24 AM  Refills Requested: Medication #1:  AMBIEN 10 MG  TABS 1 by mouth at bedtime as needed   Last Refilled: 10/13/2009 [facility]. electronically requested refill for Zolpidem 10mg .Please advise.    Method Requested: Telephone to Pharmacy Initial call taken by: Lewanda Rife LPN,  November 12, 2009 10:25 AM    Prescriptions: AMBIEN 10 MG  TABS (ZOLPIDEM TARTRATE) 1 by mouth at bedtime as needed  #30 x 3   Entered and Authorized by:   Ruthe Mannan MD   Signed by:   Ruthe Mannan MD on 11/12/2009   Method used:   Telephoned to ...       [facility] [address] (retail)       [address], [address]       [place], [place]  [address]       Ph: [phone]       Fax: [phone]   RxID:   [id number]   Appended Document: Zolpidem 10mg  rx Rx called to [facility] [address]

## 2010-08-24 NOTE — Progress Notes (Signed)
Summary: laryngiitis  Phone Note Call from Patient Call back at Work Phone [phone] Call back at extension [phone]   Caller: Patient Call For: Dr. Alphonsus Sias  Summary of Call: Patient was seen on 4-19 for laryngiitis and coughing. Patient says that she is still coughing  a little at night, but her concern is that she still  has no voice. She feels like she is straining every time she talks. She is getting very frustrated with this.She wants to know if there is anything that she can take of do about this. Uses [facility] [address] if needed. Please advise.  Initial call taken by: Melody Comas,  November 19, 2009 1:40 PM  Follow-up for Phone Call        there is nothing to take that I know of that will help at this point if it is post viral  I would rest voice as much as possible through weekend -- then if not improved I will refer her to ENT for more in depth exam to look at vocal cords  update me monday Follow-up by: Judith Part MD,  November 19, 2009 3:13 PM  Additional Follow-up for Phone Call Additional follow up Details #1::        Patient notified as instructed by telephone. Pt will call back on Mon for update.Lewanda Rife LPN  November 19, 2009 3:56 PM

## 2010-08-24 NOTE — Assessment & Plan Note (Signed)
Summary: ST,FEVER,COUGH,CONGESTION/CLE   Vital Signs:  Patient profile:   50 year old female Height:      68 inches Weight:      220.6 pounds BMI:     33.66 Temp:     98.3 degrees F oral Pulse rate:   72 / minute Pulse rhythm:   regular BP sitting:   100 / 60  (left arm) Cuff size:   regular  Vitals Entered By: Benny Lennert CMA Duncan Dull) (July 27, 2009 11:04 AM)  History of Present Illness: Chief complaint Sore throat , fever,cough,congestion  Acute Visit History:      The patient complains of cough, earache, fever, headache, nasal discharge, sinus problems, and sore throat.  These symptoms began 6 days ago.  Other comments include: very sore in anterior throat  OTC advil cold and sinus, 1000 mg of ibuprofen a and tylenol alternating. drinking a lot of fluids.        Her highest temperature has been 101.7.        The cough interferes with her sleep.  The character of the cough is described as productive.  There is no history of wheezing or shortness of breath associated with her cough.        The earache is located on both sides.        'Cold' or URI symptoms have been present with the sore throat.  There is no history of recent exposure to strep.        She complains of sinus pressure.        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) 2)  Unspecified Neuralgia Neuritis and Radiculitis  (ICD-729.2) 3)  Seizures, Hx of  (ICD-V12.49) 4)  Headache  (ICD-784.0) 5)  Lateral Epicondylitis, Left  (ICD-726.32) 6)  Rotator Cuff Syndrome, Left  (ICD-726.10) 7)  Palpitations  (ICD-785.1) 8)  Hypertension  (ICD-401.9) 9)  Pure Hypercholesterolemia  (ICD-272.0) 10)  Gerd  (ICD-530.81) 11)  Dizziness  (ICD-780.4) 12)  Polyarthritis  (ICD-716.59) 13)  Insomnia, Chronic  (ICD-307.42) 14)  Hypothyroidism  (ICD-244.9) 15)  Anemia-nos  (ICD-285.9) 16)  Perimenopausal Status  (ICD-V49.81) 17)  Cellulitis, Great Toe, Right  (ICD-681.10) 18)  Uri   (ICD-465.9) 19)  Lateral Epicondylitis, Right  (ICD-726.32) 20)  Pain in Joint, Multiple Sites  (ICD-719.49) 21)  Hx of Fatty Liver Disease  (ICD-571.8) 22)  Hx of Obesity  (ICD-278.00) 23)  Hx of Tobacco Abuse  (ICD-305.1) 24)  Hx of Tachycardia  (ICD-785.0) 25)  Hx of Positive Ppd  (ICD-795.5) 26)  Hx of Ibs  (ICD-564.1) 27)  Colonic Polyps, Hx of  (ICD-V12.72) 28)  Screening For Lipoid Disorders  (ICD-V77.91)  Current Medications (verified): 1)  Synthroid 150 Mcg  Tabs (Levothyroxine Sodium) .Marland Kitchen.. 1 By Mouth Once Daily 2)  Ambien 10 Mg  Tabs (Zolpidem Tartrate) .Marland Kitchen.. 1 By Mouth At Bedtime As Needed 3)  Multivitamins   Tabs (Multiple Vitamin) .... Take 1 Tablet By Mouth Once A Day 4)  Ibuprofen 800 Mg Tabs (Ibuprofen) .... Take 1 Tablet By Mouth Two Times A Day As Needed 5)  Toprol Xl 50 Mg Xr24h-Tab (Metoprolol Succinate) .... Take 1 Tablet By Mouth Two Times A Day 6)  Xanax 0.25 Mg Tabs (Alprazolam) .... As Needed 7)  Lisinopril-Hydrochlorothiazide 20-25 Mg Tabs (Lisinopril-Hydrochlorothiazide) .Marland Kitchen.. 1 By Mouth Once Daily 8)  Prilosec Otc 20 Mg Tbec (Omeprazole Magnesium) .... Once A Day 9)  Voltaren 1 %  Gel (Diclofenac Sodium) .... Apply 4  Times Daily To Effected Elbow 10)  Maxalt-Mlt 10 Mg Tbdp (Rizatriptan Benzoate) .... Take 1 At Onset, May Repeat in 2 Hrs If Not Resolved Completely 11)  Gabapentin 300 Mg  Caps (Gabapentin) .Marland Kitchen.. 1 By Mouth Each Night X 3 Days, Then 1 By Mouth Two Times A Day X 3 Days, Then 1 By Mouth Three Times A Day Thereafter 12)  Amoxicillin 500 Mg Caps (Amoxicillin) .... 2 Tab By Mouth Two Times A Day X 10 Days Fill If Not Improving in 3-4 Days,  Void After 08/25/2009  Allergies: 1)  ! * Kapidex 2)  ! * Omeprazole 3)  ! Nexium 4)  ! * Ranitidine 5)  Cardizem 6)  Adalat 7)  Tegretol 8)  Verapamil  Past History:  Past medical, surgical, family and social histories (including risk factors) reviewed, and no changes noted (except as noted below).  Past  Medical History: Reviewed history from 02/13/2009 and no changes required. PALPITATIONS (ICD-785.1) HYPERTENSION (ICD-401.9) PURE HYPERCHOLESTEROLEMIA (ICD-272.0) GERD (ICD-530.81) DIZZINESS (ICD-780.4) POLYARTHRITIS (ICD-716.59) INSOMNIA, CHRONIC (ICD-307.42) HYPOTHYROIDISM (ICD-244.9) ANEMIA-NOS (ICD-285.9) PERIMENOPAUSAL STATUS (ICD-V49.81) CELLULITIS, GREAT TOE, RIGHT (ICD-681.10) URI (ICD-465.9) LATERAL EPICONDYLITIS, RIGHT (ICD-726.32) PAIN IN JOINT, MULTIPLE SITES (ICD-719.49)  Past Surgical History: Reviewed history from 11/27/2007 and no changes required. Bursitis- knee, baker's cyst (12/2003) Colonoscopy- neg (2000),   polyp (12/2001) EGD- normal (12/2001) Abd [place]- fatty liver (12/2002) Baker's cyst (11/2004) 5/09EGD nl  Family History: Reviewed history from 03/20/2007 and no changes required. Father: deceased age 31- DM type 2 Mother: well Siblings: 1 brother, 1 sister. 1 sister died in MVA  Social History: Reviewed history from 02/13/2009 and no changes required. 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 light smoking since age 73 Alcohol Use - yes - once a mth Regular Exercise - yes 30 mins cardio & weights Drug Use - no  Review of Systems General:  Complains of chills and fatigue. CV:  Denies chest pain or discomfort. GI:  Denies abdominal pain.  Physical Exam  General:  overweight appearing female in ANd Head:  no maxiallry sinus ttp Ears:  clear fluid B TMs Nose:  nasal congestion, clear Mouth:  MMM, oropharyngeal erythema, no exudate Neck:  mild anterior cervical swelling.  Lungs:  Normal respiratory effort, chest expands symmetrically. Lungs are clear to auscultation, no crackles or wheezes. Heart:  Normal rate and regular rhythm. S1 and S2 normal without gallop, murmur, click, rub or other extra sounds.   Impression &  Recommendations:  Problem # 1:  PHARYNGITIS, ACUTE (ICD-462) strep neg..severe ST may be due to viral pharyngitis or post nasal drip from URI. Treat symptomatically. Given rx for antibitics if symtpoms not imrpoving as exepected per viral timeline.  Call if difficulty swallowing, to ER if SOB.  Her updated medication list for this problem includes:    Ibuprofen 800 Mg Tabs (Ibuprofen) .Marland Kitchen... Take 1 tablet by mouth two times a day as needed    Amoxicillin 500 Mg Caps (Amoxicillin) .Marland Kitchen... 2 tab by mouth two times a day x 10 days fill if not improving in 3-4 days,  void after 08/25/2009  Complete Medication List: 1)  Synthroid 150 Mcg Tabs (Levothyroxine sodium) .Marland Kitchen.. 1 by mouth once daily 2)  Ambien 10 Mg Tabs (Zolpidem tartrate) .Marland Kitchen.. 1 by mouth at bedtime as needed 3)  Multivitamins Tabs (Multiple vitamin) .... Take 1 tablet by mouth once a day 4)  Ibuprofen 800 Mg Tabs (Ibuprofen) .Marland KitchenMarland KitchenMarland Kitchen  Take 1 tablet by mouth two times a day as needed 5)  Toprol Xl 50 Mg Xr24h-tab (Metoprolol succinate) .... Take 1 tablet by mouth two times a day 6)  Xanax 0.25 Mg Tabs (Alprazolam) .... As needed 7)  Lisinopril-hydrochlorothiazide 20-25 Mg Tabs (Lisinopril-hydrochlorothiazide) .Marland Kitchen.. 1 by mouth once daily 8)  Prilosec Otc 20 Mg Tbec (Omeprazole magnesium) .... Once a day 9)  Voltaren 1 % Gel (Diclofenac sodium) .... Apply 4 times daily to effected elbow 10)  Maxalt-mlt 10 Mg Tbdp (Rizatriptan benzoate) .... Take 1 at onset, may repeat in 2 hrs if not resolved completely 11)  Gabapentin 300 Mg Caps (Gabapentin) .Marland Kitchen.. 1 by mouth each night x 3 days, then 1 by mouth two times a day x 3 days, then 1 by mouth three times a day thereafter 12)  Amoxicillin 500 Mg Caps (Amoxicillin) .... 2 tab by mouth two times a day x 10 days fill if not improving in 3-4 days,  void after 08/25/2009  Patient Instructions: 1)  Cough suppressant at night. 2)  Decrease to ibuprofen 800 mg three times a day. 3)  Start mucinex during day. 4)  If  not improving as expected in 3-4 more days, may fill rx for antibitoic.  Prescriptions: AMOXICILLIN 500 MG CAPS (AMOXICILLIN) 2 tab by mouth two times a day x 10 days Fill if not improving in 3-4 days,  VOID after 08/25/2009  #40 x 0   Entered and Authorized by:   Kerby Nora MD   Signed by:   Kerby Nora MD on 07/27/2009   Method used:   Print then Give to Patient   RxID:   [id number]   Current Allergies (reviewed today): ! * KAPIDEX ! * OMEPRAZOLE ! NEXIUM ! * RANITIDINE CARDIZEM ADALAT TEGRETOL VERAPAMIL  Appended Document: Orders Update     Clinical Lists Changes  Orders: Added new Service order of Rapid Strep (95621) - Signed

## 2010-08-24 NOTE — Progress Notes (Signed)
Summary: Triage   Phone Note Call from Patient Call back at [phone]   Caller: Patient Call For: Dr. Russella Dar Reason for Call: Talk to Nurse Summary of Call: Pt needs to r/s her Gastric Emptying Scan on 02-02-10 to possibly 02-15-10 Initial call taken by: Karna Christmas,  January 21, 2010 10:33 AM  Follow-up for Phone Call        patient is given the number to radiology to reschedule. Darcey Nora RN, [facility]  January 21, 2010 10:40 AM

## 2010-08-24 NOTE — Letter (Signed)
Summary: [facility] Instructions  [facility]  [address] [place], [place] [address]   Phone: [phone]  Fax: [phone]       Sarah Chaney    04-12-1961    MRN: [id number]        Procedure Day /Date: Friday May 6th, 2011     Arrival Time: 1:00pm     Procedure Time: 2:00pm     Location of Procedure:                    _x _  [facility] (4th Floor)                        PREPARATION FOR COLONOSCOPY WITH MOVIPREP   Starting 5 days prior to your procedure 11/22/09  do not eat nuts, seeds, popcorn, corn, beans, peas,  salads, or any raw vegetables.  Do not take any fiber supplements (e.g. Metamucil, Citrucel, and Benefiber).  THE DAY BEFORE YOUR PROCEDURE         DATE:  11/26/09  DAY:  Thursday   1.  Drink clear liquids the entire day-NO SOLID FOOD  2.  Do not drink anything colored red or purple.  Avoid juices with pulp.  No orange juice.  3.  Drink at least 64 oz. (8 glasses) of fluid/clear liquids during the day to prevent dehydration and help the prep work efficiently.  CLEAR LIQUIDS INCLUDE: Water Jello Ice Popsicles Tea (sugar ok, no milk/cream) Powdered fruit flavored drinks Coffee (sugar ok, no milk/cream) Gatorade Juice: apple, white grape, white cranberry  Lemonade Clear bullion, consomm, broth Carbonated beverages (any kind) Strained chicken noodle soup Hard Candy                             4.  In the morning, mix first dose of MoviPrep solution:    Empty 1 Pouch A and 1 Pouch B into the disposable container    Add lukewarm drinking water to the top line of the container. Mix to dissolve    Refrigerate (mixed solution should be used within 24 hrs)  5.  Begin drinking the prep at 5:00 p.m. The MoviPrep container is divided by 4 marks.   Every 15 minutes drink the solution down to the next mark (approximately 8 oz) until the full liter is complete.   6.  Follow completed prep with 16 oz of clear liquid of your choice  (Nothing red or purple).  Continue to drink clear liquids until bedtime.  7.  Before going to bed, mix second dose of MoviPrep solution:    Empty 1 Pouch A and 1 Pouch B into the disposable container    Add lukewarm drinking water to the top line of the container. Mix to dissolve    Refrigerate  THE DAY OF YOUR PROCEDURE      DATE:  11/27/09  DAY:  Friday  Beginning at 9:00 a.m. (5 hours before procedure):         1. Every 15 minutes, drink the solution down to the next mark (approx 8 oz) until the full liter is complete.  2. Follow completed prep with 16 oz. of clear liquid of your choice.    3. You may drink clear liquids until  12:00  (2 HOURS BEFORE PROCEDURE).   MEDICATION INSTRUCTIONS  Unless otherwise instructed, you should take regular prescription medications with a small sip of water  as early as possible the morning of your procedure.          OTHER INSTRUCTIONS  You will need a responsible adult at least 50 years of age to accompany you and drive you home.   This person must remain in the waiting room during your procedure.  Wear loose fitting clothing that is easily removed.  Leave jewelry and other valuables at home.  However, you may wish to bring a book to read or  an iPod/MP3 player to listen to music as you wait for your procedure to start.  Remove all body piercing jewelry and leave at home.  Total time from sign-in until discharge is approximately 2-3 hours.  You should go home directly after your procedure and rest.  You can resume normal activities the  day after your procedure.  The day of your procedure you should not:   Drive   Make legal decisions   Operate machinery   Drink alcohol   Return to work  You will receive specific instructions about eating, activities and medications before you leave.    The above instructions have been reviewed and explained to me by   Marchelle Folks.     I fully understand and can verbalize these  instructions _____________________________ Date _________

## 2010-08-24 NOTE — Progress Notes (Signed)
Summary: Triage   Phone Note Other Incoming   Caller[facility]uke Salvia @ [facility] [phone] Summary of Call: pt. was a NOS for her gastric emptying scan Initial call taken by: Karna Christmas,  February 17, 2010 9:34 AM  Follow-up for Phone Call        Left message for patient to call back Darcey Nora RN, [facility]  February 17, 2010 9:41 AM  patient reports her nausea is gone.  She works at [employer] and was busy and couldn't get away.  Her symptoms have all resolved.  She has asked me to cancel GES and office visit on 03/03/10.  She will call back for further problems, questions, or concerns. Follow-up by: Darcey Nora RN, CGRN,  February 18, 2010 8:37 AM  Additional Follow-up for Phone Call Additional follow up Details #1::        OK to cancel GES. Return REV as needed. Additional Follow-up by: Meryl Dare MD Clementeen Graham,  February 18, 2010 8:43 AM

## 2010-08-24 NOTE — Progress Notes (Signed)
----   Converted from flag ---- ---- 01/18/2010 9:08 PM, Judith Part MD wrote: please check wellness prof and lipids  v70.0 and 272 - thanks   ---- 01/18/2010 7:55 AM, Liane Comber CMA (AAMA) wrote: Pt is scheduled for cpx labs tomorrow, what labs to draw and dx codes? Thanks Tasha ------------------------------

## 2010-08-24 NOTE — Progress Notes (Signed)
Summary: needs order for mammogram  Phone Note Call from Patient Call back at Work Phone [phone]   Caller: Patient Call For: Judith Part MD Summary of Call: Patient is due for a mammogram and would like to go to [facility]. She needs an order for this sent to them so that she can schedule her appt.  Initial call taken by: Melody Comas,  May 05, 2010 11:14 AM  Follow-up for Phone Call        will do ref for [facility] Follow-up by: Judith Part MD,  May 05, 2010 11:38 AM  New Problems: OTHER SCREENING MAMMOGRAM (ICD-V76.12)   New Problems: OTHER SCREENING MAMMOGRAM (ICD-V76.12)

## 2010-08-24 NOTE — Progress Notes (Signed)
Summary: med change?   Phone Note Call from Patient Call back at Home Phone [phone]   Caller: Patient Call For: Dr. Russella Dar Reason for Call: Talk to Nurse Summary of Call: pt thinks her Omeprazole dosage was to be changed but doesnt think it was  Initial call taken by: Vallarie Mare,  October 19, 2009 9:56 AM  Follow-up for Phone Call         Pt states she wasn't sure if her rx was sent  in. Prescription for omeprazole 40mg  has already been sent in to her pharmacy. Follow-up by: Christie Nottingham CMA Duncan Dull),  October 19, 2009 10:10 AM

## 2010-08-24 NOTE — Letter (Signed)
Summary: [facility]  [facility]   Imported By: Lanelle Bal 09/29/2009 09:31:53  _____________________________________________________________________  External Attachment:    Type:   Image     Comment:   External Document

## 2010-08-24 NOTE — Progress Notes (Signed)
Summary: Rx Zolpidem  Phone Note Refill Request Call back at [phone] Message from:  [facility] on May 03, 2010 8:20 AM  Refills Requested: Medication #1:  AMBIEN 10 MG  TABS 1 by mouth at bedtime as needed   Last Refilled: 03/27/2010 Received E-script request please advise.   Method Requested: Telephone to Pharmacy Initial call taken by: Linde Gillis CMA Duncan Dull),  May 03, 2010 8:21 AM  Follow-up for Phone Call        px written on EMR for call in  Follow-up by: Judith Part MD,  May 03, 2010 10:36 AM  Additional Follow-up for Phone Call Additional follow up Details #1::        Medication phoned to [facility] pharmacy as instructed. Lewanda Rife LPN  May 03, 2010 5:00 PM     Prescriptions: AMBIEN 10 MG  TABS (ZOLPIDEM TARTRATE) 1 by mouth at bedtime as needed  #30 x 5   Entered and Authorized by:   Judith Part MD   Signed by:   Lewanda Rife LPN on 45/40/9811   Method used:   Telephoned to ...       [facility] [address] [phone]* (retail)       [address]       [place], [place]  [address]       Ph: [phone] or [phone]       Fax: [phone]   RxID:   [id number]

## 2010-08-24 NOTE — Progress Notes (Signed)
Summary: regarding cardiologists  Phone Note Call from Patient Call back at [phone] x [phone], ok to leave message   Caller: Patient Call For: Sarah Part MD Summary of Call: Pt previously saw Dr. Daleen Squibb in cardiology but he is no longer there.  She is asking your opinion of Dr. Mariah Milling. Initial call taken by: Lowella Petties CMA,  April 26, 2010 9:38 AM  Follow-up for Phone Call        I really really like Dr Mariah Milling -- I think she will too  Follow-up by: Sarah Part MD,  April 26, 2010 10:26 AM  Additional Follow-up for Phone Call Additional follow up Details #1::        Patient notified as instructed by telephone. Additional Follow-up by: Sydell Axon LPN,  April 26, 2010 11:19 AM

## 2010-08-24 NOTE — Assessment & Plan Note (Signed)
Summary: PULLED MUSCLE IN BACK/DLO   Vital Signs:  Patient profile:   50 year old female Weight:      223.75 pounds Temp:     97.7 degrees F oral Pulse rate:   66 / minute Pulse rhythm:   regular BP sitting:   112 / 70  (left arm) Cuff size:   large  Vitals Entered By: Selena Batten Dance CMA Duncan Dull) (May 10, 2010 3:34 PM) CC: Pulled muscle in back   History of Present Illness: CC: muscle pain in back  2 wk h/o "catching" between shoulder blades.  Taking ibuprofen 800mg , using heating pad.  [place] went [place] zoo, carried daughter on shoulders, last night worse.  Now back hurting, right breast hurting right shoulder hurting.  Going through to chest.  Very positional  certain motions like answering phone.  upper back  no other injury to back.  No arm pain.  No tingling/numbness down arms, weakness.  has had in past but never this long.  Current Medications (verified): 1)  Synthroid 150 Mcg  Tabs (Levothyroxine Sodium) .Marland Kitchen.. 1 By Mouth Once Daily 2)  Ambien 10 Mg  Tabs (Zolpidem Tartrate) .Marland Kitchen.. 1 By Mouth At Bedtime As Needed 3)  Multivitamins   Tabs (Multiple Vitamin) .... Take 1 Tablet By Mouth Once A Day 4)  Ibuprofen 800 Mg Tabs (Ibuprofen) .... Take 1 Tablet By Mouth Three Times A Day As Needed With Food 5)  Toprol Xl 50 Mg Xr24h-Tab (Metoprolol Succinate) .... Take 1 Tablet By Mouth Two Times A Day 6)  Lisinopril-Hydrochlorothiazide 20-25 Mg Tabs (Lisinopril-Hydrochlorothiazide) .Marland Kitchen.. 1 By Mouth Once Daily 7)  Omeprazole 40 Mg Cpdr (Omeprazole) .... One Tablet By Mouth Twice Daily  Allergies: 1)  ! * Kapidex 2)  ! Procardia 3)  Cardizem 4)  Adalat 5)  Tegretol 6)  Verapamil PMH-FH-SH reviewed for relevance  Review of Systems       per HPI  Physical Exam  General:  overweight but generally well appearing  Msk:  FROM at spine without pain.  max tenderness to palpation R paraspinous mm just medial to R scapula.  limited and tight motion of R scapula compared to L with arm  motion.  no shoulder tenderness bilaterally. Pulses:  2+ rad pulses   Impression & Recommendations:  Problem # 1:  BACK STRAIN, THORACIC (ICD-847.1) leading to scapular dyskinesis.  treat conservatively with NSAIDs, flexeril, ice/heat, rest and stretching exercises provided today (scapular squeeze, etc).  if not improved, send to PT for scapular dyskinesis.  or consider referral to Dr. Patsy Lager.  Complete Medication List: 1)  Synthroid 150 Mcg Tabs (Levothyroxine sodium) .Marland Kitchen.. 1 by mouth once daily 2)  Ambien 10 Mg Tabs (Zolpidem tartrate) .Marland Kitchen.. 1 by mouth at bedtime as needed 3)  Multivitamins Tabs (Multiple vitamin) .... Take 1 tablet by mouth once a day 4)  Ibuprofen 800 Mg Tabs (Ibuprofen) .... Take 1 tablet by mouth three times a day as needed with food 5)  Toprol Xl 50 Mg Xr24h-tab (Metoprolol succinate) .... Take 1 tablet by mouth two times a day 6)  Lisinopril-hydrochlorothiazide 20-25 Mg Tabs (Lisinopril-hydrochlorothiazide) .Marland Kitchen.. 1 by mouth once daily 7)  Omeprazole 40 Mg Cpdr (Omeprazole) .... One tablet by mouth twice daily 8)  Flexeril 10 Mg Tabs (Cyclobenzaprine hcl) .... Take 1/2 to 1 by mouth two times a day as needed muscle spasm  Patient Instructions: 1)  I think you pulled a muscle and this has led to some scapular dyskinesis.  Treat with stretching exercises  shown today, and muscle relaxant with NSAID.  COntinue heating pad, try ice to see which makes you feel better. 2)  If not improving with these measures call us and we will send you to physical therapy. 3)  Hope you feel better. 4)  Let us know if worsening instead of improving as expected. Prescriptions: FLEXERIL 10 MG TABS (CYCLOBENZAPRINE HCL) take 1/2 to 1 by mouth two times a day as needed muscle spasm  #30 x 0   Entered and Authorized by:   Eustaquio Boyden  MD   Signed by:   Eustaquio Boyden  MD on 05/10/2010   Method used:   Electronically to        [facility]  [address] (retail)       [address]  [address]       [place], [place]  [address]       Ph: [phone]       Fax: [phone]   RxID:   [id number]    Orders Added: 1)  Est. Patient Level III [84132]    Current Allergies (reviewed today): ! * KAPIDEX ! PROCARDIA CARDIZEM ADALAT TEGRETOL VERAPAMIL

## 2010-08-24 NOTE — Miscellaneous (Signed)
Summary: pap screening   Clinical Lists Changes  Observations: Added new observation of PAP SMEAR: normal (01/26/2010 11:33)      Preventive Care Screening  Pap Smear:    Date:  01/26/2010    Results:  normal

## 2010-08-26 NOTE — Assessment & Plan Note (Signed)
Summary: HA,DIZZY,LEG PAIN/CLE   Vital Signs:  Patient profile:   50 year old female Height:      68 inches Weight:      222.75 pounds BMI:     33.99 Temp:     98 degrees F oral Pulse rate:   80 / minute Pulse rhythm:   regular BP sitting:   116 / 68  (left arm) Cuff size:   large  Vitals Entered By: Lewanda Rife LPN (August 19, 2010 9:31 AM) CC: H/a ,dizziness on anf off but now has alt least once a day. both legs hurt Pain level now is 5.   History of Present Illness: here for headache and leg pain and dizziness  leg pain is chronic -- went to ortho  was ref to neuro at this time  neurontin and lyrica made her really loopy  ? what her dx is  ? restless legs --that is what she thinks it is  wants to try requip again  achey and creepy crawly feeling -- cannot stay still , also a tense feeling in legs  blood count great in november   headaches and dizziness are horrible   dizziness -- she is getting spells every day -- about 5 times per day  sometimes a quick wave of it - has to hold on  sometimes after those she gets foggy feeling afterwards - that could last 30-60 min -- feels really lightheaded but not spinning   headaches start with black dots in vision -- and then weird peripheral vision and then headache  have inc in frequency from 1-2 per year to more frequently  sometimes headache follows dizziness at other times not  at least 4 per week 800 mg of ibuprofen helps occasionally  sometimes - pounding /severe / light sensitivity /=-- migraine  no triggers at all  1 cup of coffee in the am and 1-2 sodas per day  is addicted to diet coke  never been on migraine drugs in the past  headache is bilateral - frontal and pounding     Allergies: 1)  ! * Kapidex 2)  ! Procardia 3)  Cardizem 4)  Adalat 5)  Tegretol 6)  Verapamil  Past History:  Past Medical History: Last updated: 01/29/2010 HYPERTENSION (ICD-401.9) PURE HYPERCHOLESTEROLEMIA (ICD-272.0) GERD  (ICD-530.81) POLYARTHRITIS (ICD-716.59) INSOMNIA, CHRONIC (ICD-307.42) HYPOTHYROIDISM (ICD-244.9) ANEMIA-NOS (ICD-285.9) PERIMENOPAUSAL STATUS (ICD-V49.81) PAIN IN JOINT, MULTIPLE SITES (ICD-719.49) ADENOMATOUS COLON POLYPS, 1997 gastric polyps   Past Surgical History: Last updated: 06/14/2010 Bursitis- knee, baker's cyst (12/2003) Colonoscopy- neg (2000),   polyp (12/2001) EGD- normal (12/2001) Abd [place]- fatty liver (12/2002) Baker's cyst (11/2004) 5/09EGD nl Abd [place] - fatty liver, normal gallbladder and pancreas (05/2010)  Family History: Last updated: 01-29-2010 Father: deceased age 62- DM type 2 Mother: DM, chol  Siblings: 1 brother, 1 sister. 1 sister died in MVA neice DM  Social History: Last updated: 2010/01/29 Marital Status: Married Children: 2 daughters Tobacco Use -QUIT 2009... light smoking since age 33 Alcohol Use - yes - once a mth Regular Exercise - yes 30 mins cardio & weights Drug Use - no Occupation: Engineer, site Daily Caffeine Use 4 per day coffee and sodas helps care for her grandchild  works at [employer] and vein  quit smoking   Risk Factors: Exercise: yes (02/13/2009)  Risk Factors: Smoking Status: quit (05/20/2009)  Review of Systems General:  Complains of fatigue; denies chills, fever, loss of appetite, and malaise. Eyes:  Denies blurring and double vision. ENT:  Complains of earache, nasal congestion, and ringing in ears. CV:  Denies chest pain or discomfort, palpitations, shortness of breath with exertion, and swelling of feet. Resp:  Denies cough and shortness of breath. GI:  Complains of nausea; denies abdominal pain, change in bowel habits, indigestion, and vomiting. GU:  Denies urinary frequency. MS:  Complains of stiffness; denies joint redness, joint swelling, and cramps. Derm:  Denies itching, lesion(s), poor wound healing, and rash. Neuro:  Complains of headaches, sensation of room spinning, and tingling; denies difficulty  with concentration, disturbances in coordination, falling down, seizures, tremors, visual disturbances, and weakness. Psych:  Denies anxiety and depression. Endo:  Denies cold intolerance, excessive thirst, excessive urination, and heat intolerance. Heme:  Denies abnormal bruising and bleeding.  Physical Exam  General:  overweight but generally well appearing seems generally fatigued and frustrated  Head:  normocephalic, atraumatic, and no abnormalities observed.  no sinus or temporal tenderness Eyes:  vision grossly intact, pupils equal, pupils round, and pupils reactive to light.  fundi grossly wnl  no nystagmus  no conjunctival pallor, injection or icterus  Ears:  R ear normal and L ear normal.   Nose:  nares are boggy but clear  Mouth:  pharynx pink and moist, no erythema, and no exudates.   Neck:  supple with full rom and no masses or thyromegally, no JVD or carotid bruit  Chest Wall:  No deformities, masses, or tenderness noted. Lungs:  Normal respiratory effort, chest expands symmetrically. Lungs are clear to auscultation, no crackles or wheezes. Heart:  Normal rate and regular rhythm. S1 and S2 normal without gallop, murmur, click, rub or other extra sounds. Abdomen:  Bowel sounds positive,abdomen soft and non-tender without masses, organomegaly or hernias noted. no renal bruits  Msk:  No deformity or scoliosis noted of thoracic or lumbar spine.   Pulses:  R and L carotid,radial,femoral,dorsalis pedis and posterior tibial pulses are full and equal bilaterally Extremities:  No clubbing, cyanosis, edema, or deformity noted with normal full range of motion of all joints.   Neurologic:  alert & oriented X3, cranial nerves II-XII intact, strength normal in all extremities, sensation intact to light touch, gait normal, DTRs symmetrical and normal, and Romberg negative.   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 poss of vestibular migraines disc triggers such as stress or hormonal change with menopause   Her updated medication list for this problem includes:    Ibuprofen 800 Mg Tabs (Ibuprofen) .Marland Kitchen... Take 1 tablet by mouth three times a day as needed with food    Toprol Xl 50 Mg Xr24h-tab (Metoprolol succinate) .Marland Kitchen... Take 1 tablet by mouth two times a day  Orders: Radiology Referral (Radiology) Neurology Referral (Neuro)  Problem # 3:  DIZZINESS (ICD-780.4) Assessment: Deteriorated see  above  sometimes assoc with migraine type headache / other times not  is becoming more persistant and life altering  disc safety issues  ref to neurology of note also past hx of seizures -- but no recent syncope reported  Orders: Radiology Referral (Radiology) Neurology Referral (Neuro)  Complete Medication List: 1)  Synthroid 150 Mcg Tabs (Levothyroxine sodium) .Marland Kitchen.. 1 by mouth once daily 2)  Ambien 10 Mg Tabs (Zolpidem tartrate) .Marland Kitchen.. 1 by mouth at bedtime as needed 3)  Multivitamins Tabs (Multiple vitamin) .... Take 1 tablet by mouth once a day 4)  Ibuprofen 800 Mg Tabs (Ibuprofen) .... Take 1 tablet by mouth three times a day as needed with food 5)  Toprol Xl 50 Mg Xr24h-tab (Metoprolol succinate) .... Take 1 tablet by mouth two times a  day 6)  Lisinopril-hydrochlorothiazide 20-25 Mg Tabs (Lisinopril-hydrochlorothiazide) .Marland Kitchen.. 1 by mouth once daily 7)  Omeprazole 40 Mg Cpdr (Omeprazole) .... One tablet by mouth twice daily 8)  Requip 0.5 Mg Tabs (Ropinirole hcl) .... 1/2 pill each bedtime for 1 week and then increase to 1 pill each bedtime  Patient Instructions: 1)  we will do referral for MRI today at check out  2)  we will do a referral to neurology at check out  3)  please try the requip for restless legs - update if any problems or side effects  Prescriptions: REQUIP 0.5 MG TABS (ROPINIROLE HCL) 1/2 pill each bedtime for 1 week and then increase to 1 pill each bedtime  #30 x 5   Entered and Authorized by:   Judith Part MD   Signed by:   Judith Part MD on 08/19/2010   Method used:   Electronically to        [facility] (retail)       [address], [address]       [place], [place]  [address]       Ph: [phone]       Fax: [phone]   RxID:   [id number]    Orders Added: 1)  Radiology Referral [Radiology] 2)  Neurology Referral [Neuro] 3)  Prescription Created Electronically [[id number]] 4)  Est. Patient Level IV [56387]    Current Allergies (reviewed today): ! * KAPIDEX ! PROCARDIA CARDIZEM ADALAT TEGRETOL VERAPAMIL

## 2010-08-26 NOTE — Progress Notes (Signed)
  Phone Note From Other Clinic   Caller: Nurse from [facility] Summary of Call: [facility] to have MRI Brain pre-approved. Nurse said the clinical info did not demonstate with their evidence based guidelines and therefore would not be approved. Patient to see Neurologist on 08/27/2010. Called the patient to let her know that MRI not approved and we cancelled it. She will discuss this with the Neurologist next Friday. Seeing Dr Hollice Espy with [facility] . Records faxed. Initial call taken by: Carlton Adam,  August 20, 2010 4:22 PM  Follow-up for Phone Call        aware- thanks for the update pt will disc at neurol visit  Follow-up by: Judith Part MD,  August 20, 2010 4:26 PM

## 2010-10-16 ENCOUNTER — Emergency Department: Payer: Self-pay | Admitting: Emergency Medicine

## 2010-10-18 ENCOUNTER — Encounter: Payer: Self-pay | Admitting: Family Medicine

## 2010-10-19 ENCOUNTER — Ambulatory Visit: Payer: Self-pay | Admitting: Family Medicine

## 2010-11-01 ENCOUNTER — Other Ambulatory Visit: Payer: Self-pay | Admitting: Cardiovascular Disease

## 2010-11-01 MED ORDER — METOPROLOL SUCCINATE ER 50 MG PO TB24: 50.0000 mg | ORAL_TABLET | Freq: Two times a day (BID) | ORAL | Status: DC

## 2010-11-01 NOTE — Telephone Encounter (Signed)
Pt coming in to see Dr.Gollan on 11/09/2010 @ 11. rx called into pharmacy

## 2010-11-01 NOTE — Telephone Encounter (Signed)
Pt was a [facility] pt, but will follow Gollan.

## 2010-11-10 ENCOUNTER — Encounter: Payer: Self-pay | Admitting: Cardiovascular Disease

## 2010-11-10 ENCOUNTER — Ambulatory Visit (INDEPENDENT_AMBULATORY_CARE_PROVIDER_SITE_OTHER): Payer: 59 | Admitting: Cardiovascular Disease

## 2010-11-10 DIAGNOSIS — E669 Obesity, unspecified: Secondary | ICD-10-CM

## 2010-11-10 DIAGNOSIS — E7800 Pure hypercholesterolemia, unspecified: Secondary | ICD-10-CM

## 2010-11-10 DIAGNOSIS — R002 Palpitations: Secondary | ICD-10-CM

## 2010-11-10 DIAGNOSIS — I10 Essential (primary) hypertension: Secondary | ICD-10-CM

## 2010-11-10 DIAGNOSIS — E785 Hyperlipidemia, unspecified: Secondary | ICD-10-CM

## 2010-11-10 MED ORDER — ROSUVASTATIN CALCIUM 5 MG PO TABS: 5.0000 mg | ORAL_TABLET | Freq: Every day | ORAL | Status: DC

## 2010-11-10 MED ORDER — HYDROCHLOROTHIAZIDE 25 MG PO TABS
25.0000 mg | ORAL_TABLET | ORAL | Status: DC | PRN
Start: 2010-11-10 — End: 2011-11-10

## 2010-11-10 MED ORDER — HYDROCHLOROTHIAZIDE 25 MG PO TABS: 25.0000 mg | ORAL_TABLET | Freq: Every day | ORAL | Status: DC

## 2010-11-10 NOTE — Patient Instructions (Addendum)
Stop lisinopril/HCTZ Start HCTZ PRN for swelling in the legs. Take extra Toprol 1/2 tab as needed for palpitations. Start crestor 5 mg daily. We should check cholesterol in 2 to 3 months time.  We will call you with an appt in 6 months

## 2010-11-10 NOTE — Progress Notes (Signed)
   Patient ID: Sarah Chaney, female    DOB: 1961/01/24, 50 y.o.   MRN: [id number]  HPI Comments: Sarah Chaney is a very pleasant 50 year old woman with a history of hypertension, hyperlipidemia, palpitations who was previously seen in cardiology clinic for palpitations who has been relatively well controlled on Toprol who presents for routine followup.  She reports that overall she has been doing well. She has continued to have periods of palpitations. She had an episode on March 24 after lunch when she was going to a movie when she developed acute onset of nausea, diaphoresis, tachycardia with shortness of breath. She went to the emergency room where her EKG looked normal, blood work also looked normal with normal cardiac enzymes, TSH 0.94. She was discharged home with no further workup scheduled. Since then she has felt well.  She is working full time, does not participate in regular exercise program. She denies any chest pain or shortness of breath. Her palpitations come some of which are in the daytime, frequently at nighttime. Sometimes she does not take her p.m. Toprol as she feels well. Other times she has breakthrough palpitations.  EKG shows normal sinus rhythm with rate 63 beats per minute with no significant ST or T wave changes.     Review of Systems  Constitutional: Negative.   HENT: Negative.   Eyes: Negative.   Respiratory: Negative.   Cardiovascular: Positive for palpitations.  Gastrointestinal: Negative.   Musculoskeletal: Negative.   Skin: Negative.   Neurological: Negative.   Hematological: Negative.   Psychiatric/Behavioral: Negative.   All other systems reviewed and are negative.   BP 112/68  Pulse 63  Ht 5\' 8"  (1.727 m)  Wt 215 lb 1.9 oz (97.578 kg)  BMI 32.71 kg/m2    Physical Exam  Nursing note and vitals reviewed. Constitutional: She is oriented to person, place, and time. She appears well-developed and well-nourished.  HENT:  Head: Normocephalic.  Nose:  Nose normal.  Mouth/Throat: Oropharynx is clear and moist.  Eyes: Conjunctivae are normal. Pupils are equal, round, and reactive to light.  Neck: Normal range of motion. Neck supple. No JVD present.  Cardiovascular: Normal rate, regular rhythm, normal heart sounds and intact distal pulses.  Exam reveals no gallop and no friction rub.   No murmur heard. Pulmonary/Chest: Effort normal and breath sounds normal. No respiratory distress. She has no wheezes. She has no rales. She exhibits no tenderness.  Abdominal: Soft. Bowel sounds are normal. She exhibits no distension. There is no tenderness.  Musculoskeletal: Normal range of motion. She exhibits no edema and no tenderness.  Lymphadenopathy:    She has no cervical adenopathy.  Neurological: She is alert and oriented to person, place, and time. Coordination normal.  Skin: Skin is warm and dry. No rash noted. No erythema.  Psychiatric: She has a normal mood and affect. Her behavior is normal. Judgment and thought content normal.         Assessment and Plan

## 2010-11-10 NOTE — Assessment & Plan Note (Signed)
Blood pressure is borderline low and in the setting of dizziness, we will hold her lisinopril HCT and start HCTZ p.r.n. For edema. She will continue her metoprolol.

## 2010-11-10 NOTE — Assessment & Plan Note (Signed)
We have encouraged continued exercise, careful diet management in an effort to lose weight. 

## 2010-11-10 NOTE — Assessment & Plan Note (Signed)
She continues to have periods of palpitations. Also occasional episodes of dizziness. We have suggested we hold her lisinopril HCT, changing this prescription to HCTZ p.r.n. For edema. Her blood pressures on the borderline low side and given her dizziness, or like to make sure that this is not from hypotension or dehydration.  She can also take extra metoprolol 1/2 dose for breakthrough palpitations.

## 2010-11-10 NOTE — Assessment & Plan Note (Signed)
We have discussed her cholesterol with her. It is likely combination of genetics mixed with mild obesity. We have encouraged her to watch her weight, increase exercise. We have recommended she start a low-dose cholesterol medication as her numbers are trending upwards, likely with weight gain. She will try Crestor 5 mg daily and we have recommended a cholesterol check in 2-3 months. Given that her numbers suggest borderline diabetes, SOB even more important.

## 2010-11-15 ENCOUNTER — Other Ambulatory Visit: Payer: Self-pay | Admitting: Family Medicine

## 2010-11-15 NOTE — Telephone Encounter (Signed)
Px written for call in   

## 2010-11-15 NOTE — Telephone Encounter (Signed)
Medication phoned to [facility] as instructed.

## 2010-11-17 ENCOUNTER — Telehealth: Payer: Self-pay | Admitting: Cardiovascular Disease

## 2010-11-17 NOTE — Telephone Encounter (Signed)
What med/dose would you like me to have pt try? Please advise.

## 2010-11-17 NOTE — Telephone Encounter (Signed)
Pt is taking Crestor 5 mg.  Cannot take it because it is giving severe leg cramps.

## 2010-11-19 ENCOUNTER — Other Ambulatory Visit: Payer: Self-pay | Admitting: Cardiovascular Disease

## 2010-11-19 MED ORDER — PRAVASTATIN SODIUM 20 MG PO TABS: 10.0000 mg | ORAL_TABLET | Freq: Every evening | ORAL | Status: DC

## 2010-11-19 NOTE — Telephone Encounter (Signed)
Spoke to pt, she states she has already tried to do every other day with crestor, and when she had muscle aches, they were so severe that she was in the bed with heating packs, and states she does not want to go through that again. Notified pt we will go ahead and call in Pravastatin 20mg  and will have her take 1/2 tab daily. She will have repeat labs in July 2012, notified pt will adjust dose as needed then. Pt was asking if Fish oil would also be good to take, she has fam hx of severely high triglycerides, notified pt that it could be beneficial to take 1gm b.i.d for triglycerides, notified pt her last levels were mildly elevated. Pt will call back with any problems with new med.

## 2010-11-19 NOTE — Telephone Encounter (Signed)
She could try every other day to start. Also can add CoEnz Q10 If that does not work let us know. We could try pravastatin 10 to 20 mg

## 2010-11-22 ENCOUNTER — Encounter: Payer: Self-pay | Admitting: Cardiovascular Disease

## 2010-11-23 ENCOUNTER — Encounter: Payer: Self-pay | Admitting: Cardiovascular Disease

## 2010-12-07 NOTE — Assessment & Plan Note (Signed)
[facility] OFFICE NOTE   Sarah, Chaney                          MRN:          [id number]  DATE:03/30/2007                            DOB:          1960-10-21    CARDIOLOGY CONSULTATION NOTE   I was asked by Dr. Milinda Antis to evaluate Sarah Chaney, a 50 year old married  white female with palpitations.   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  They generally are in short spurts and feel a little bit  irregular.  They only last a few seconds.   On a recent flight, she developed some aching in her left arm, as if she  had pulled a muscle.  It went away in a brief moment, and then about a  half an hour later, she had some palpitations.   She tries to do 30 minutes of cardio and weights on a regular basis.  Other than noting that her heart rate is exaggerated with exercise, she  has no complaints of any angina or ischemic equivalents.  She has never  had any pre-syncope or syncope.  Her palpitations are not made worse by  exercise.   PAST MEDICAL HISTORY:  She is intolerant of PROCARDIA and TEGRETOL.  She  has had no history of any dye reaction.   She does smoke and smokes about a half pack per day since age 82.  She  drinks alcoholic beverages once a month.  She has 1 or 2 caffeinated  beverages a day.   CURRENT MEDICATIONS:  1. Synthroid 150 mcg a day.  This dose was recently adjusted because      of a low TSH by Dr. Milinda Antis.  2. Diovan hydrochlorothiazide 80/12.5 daily.  3. Prevacid 30 mg a day.  4. Ambien 10 mg a day.   She has had a history of hypertension for about 16 years.  She has had a  history of Hashimoto's thyroiditis and has been on thyroid replacement  since youth.   SURGICAL HISTORY:  She had 2 C-sections in 1989 and 1990.  She has had a  breast reduction in 1991.  She has had a left  wrist cyst removed and  also a tummy tuck in 2006.   Her family history is really noncontributory for premature coronary  disease.  Her mother has high cholesterol.   SOCIAL HISTORY:  She is a Financial controller.  She is married.  She has 2  children.  Her first husband died of a massive heart attack at an early  age.   REVIEW OF SYSTEMS:  She has chronic iron deficiency anemia.  She says  she cannot take iron.  She has a history of reflux, menstrual  dysfunction, and the thyroid abnormality as mentioned above.  Otherwise,  her review of systems are negative.   EXAM:  Blood pressure is 122/72, pulse 64 and regular.  She is 5 feet 8  inches and weighs 196 pounds.  HEENT:  Normocephalic, atraumatic.  PERRLA.  Extraocular muscles are  intact.  Sclerae clear.  Facial symmetry is normal.  Dentition is  unremarkable.  Carotid upstrokes equal bilaterally without bruits.  No  JVD.  thyroid is not enlarged.  Trachea is midline.  LUNGS:  Remarkable for slight inspiratory/expiratory rhonchi.  PMI is not displaced.  She has a normal S1, S2 without gallop, rub, or  murmur.  ABDOMEN:  Soft with good bowel sounds.  EXTREMITIES:  No edema.  Pulses are brisk.  NEURO:  Exam is intact.  SKIN:  Unremarkable.   ELECTROCARDIOGRAM:  In 2004 and also the other day on March 23, 2007  are normal.   ASSESSMENT:  1. Probable benign tachy palpitations.  This could be exacerbated by      iatrogenic hyperthyroidism.  This is being addressed at the present      time by reduction in her Synthroid dose by Dr. Milinda Antis.  2. Exaggerated heart rate to exercise.  I suspect this is      deconditioning, smoking history, and low grade anemia.  She relates      a history of cardiomegaly in the past by x-ray.  She cannot      remember when this was.  3. Family history of hyperlipidemia.   RECOMMENDATIONS:  1. Continue current dose of Synthroid with careful followup with Dr.      Milinda Antis.  2. A 2D echocardiogram to rule  out any structural heart disease.  3. Decrease smoking or stop all together.  4. Fasting lipid panel with Dr. Milinda Antis.  With her risk factors, I would      probably consider a Statin if her LDL is above 100.   Her echocardiogram is normal.  Reassurance will be given.  I will see  her back on a p.r.n. basis.     Thomas C. Daleen Squibb, MD, [facility]  Electronically Signed    TCW/MedQ  DD: 03/30/2007  DT: 03/30/2007  Job #: [id number]   cc:   Marne A. Milinda Antis, MD

## 2010-12-07 NOTE — Assessment & Plan Note (Signed)
[facility] OFFICE NOTE   KARRI, KALLENBACH                          MRN:          [id number]  DATE:08/26/2008                            DOB:          1960/10/01    Mr. Guiffre comes in today for worsening palpitations.  This has been going  on for about a month now.  They have been off and on all day.  She has  had no syncope or presyncope.  She denies any shortness of breath,  orthopnea, or PND.   I saw her initially for some cardiomegaly on a chest x-ray.  A 2-D  echocardiogram was essentially normal.  There was no evidence of mitral  valve prolapse and her heart size was normal including left ventricular  end-diastolic and systolic dimension.  There was no LVH.  Left atrial  size was normal.  Right-sided function was also normal.   She recently saw Dr. Roxy Manns at [facility].  TSH, potassium, and  hemoglobin were all normal.   She has been under a lot of stress.  Her daughter who is here with her  today just had a baby and they are living with her since her son-in-law  has been deployed.  In addition, she is trying to quit smoking over the  last 3 weeks.  Finally, she has been having a lot of hot flashes.  I  think, she is going through the mid-life changes.   MEDICATIONS:  1. Synthroid 150 mcg a day.  2. Diovan/HCTZ 80/12.5 daily.  3. Ambien 10 mg nightly, which she takes pretty much every night.  4. Prilosec over the counter.  5. [employer] vitamins.   PHYSICAL EXAMINATION:  VITAL SIGNS:  Her blood pressure is 114/80, her  pulse is 80 and regular.  Weight is 205.  GENERAL:  She is in no acute distress.  HEENT:  Normal.  NECK:  Supple.  No lymphadenopathy.  No thyromegaly.  Her carotid  upstrokes were equal bilateral without bruits.  No JVD.  Thyroid is not  enlarged.  Trachea is midline.  LUNGS:  Clear to auscultation and percussion.  HEART:  A soft S1 and S2.  No murmur, rub, or gallop.  ABDOMEN:  Soft, good bowel sounds.  No pulsatile mass.  No tenderness.  EXTREMITIES:  No cyanosis, clubbing, or edema.  There is no sign of DVT.  NEUROLOGIC:  Intact.  MUSCULOSKELETAL:  Intact.   Electrocardiogram is normal.  The rhythm strip from Dr. Royden Purl office  shows no significant arrhythmia.  Specifically, her PR interval, QRS,  and QTC intervals are normal.   ASSESSMENT AND PLAN:  Benign tachypalpitations.  I suspect, this is  related to 3 of the major stresses as mentioned above.   PLAN:  1. A 24-hour Holter to identify causation.  2. Begin metoprolol extended release 50 mg a day.  This can be tapered      off in about 3 months.  3. Xanax 0.25 p.r.n. as an anxiolytic.  4. Talked to Dr. Marina Gravel at [facility] about low-dose  estrogen.  She continues not to smoke, particularly considering her      young age, she should have very little thromboembolic risk.   All questions were answered.     Thomas C. Daleen Squibb, MD, [facility]  Electronically Signed    TCW/MedQ  DD: 08/26/2008  DT: 08/27/2008  Job #: [id number]   cc:   Gerri Spore B. Earlene Plater, M.D.

## 2010-12-10 NOTE — Op Note (Signed)
. [facility]  Patient:    Sarah Chaney, Sarah Chaney Visit Number: [id number] MRN: [id number]          Service Type: END Location: ENDO Attending Physician:  Charna Elizabeth Dictated by:   Anselmo Rod, M.D. Proc. Date: 12/31/01 Admit Date:  12/31/2001 Discharge Date: 12/31/2001   CC:         Roxy Manns, M.D. [facility]  Cynthia P. Ashley Royalty, M.D.   Operative Report  DATE OF BIRTH:  [birth year]  PROCEDURE PERFORMED:  Colonoscopy with cold biopsies x2 endoscopy.  ENDOSCOPIST:  Anselmo Rod, M.D.  INSTRUMENT USED:  Olympus pediatric colonoscope.  INDICATION FOR PROCEDURE:  A 50 year old white female with a history of adenomatous polyps removed over six years ago, rule out recurrent polyps.  PREPROCEDURE PREPARATION:  Informed consent was procured from the patient. The patient fasted for eight hours prior to the procedure and prepped with a bottle of magnesium citrate and a gallon of NuLytely the night prior to the procedure.  PREPROCEDURE PHYSICAL:  VITAL SIGNS:  Stable.  NECK:  Supple.  CHEST:  Clear to auscultation. S1, S2 regular.  ABDOMEN:  Soft with normal bowel sounds.  DESCRIPTION OF PROCEDURE:  The patient was placed in the left lateral decubitus position, sedated with Demerol and Versed for the EGD. No additional sedation was used for the colonoscopy. Once the patient was adequately sedated, maintained on low-flow oxygen and continuous cardiac monitoring, the pediatric Olympus colonoscope was advanced from the rectum to the cecum with slight difficulty. There was some residual stool in the right colon. The appendiceal orifice and the ileocecal valve were clearly visualized. At 4 oclock a small sessile polyp was removed from 10 cm with cold biopsy forceps. Small internal hemorrhoids were seen on retroflexion of the rectum. The terminal ileum appeared normal and healthy. No other masses, polyps, or diverticular disease was  identified.  IMPRESSION: 1. Essentially normal colonoscopy up to the terminal ileum except for small    sessile polyp biopsied from 10 cm. 2. Small nonbleeding internal hemorrhoid.  RECOMMENDATIONS: 1. Await pathology results. 2. Outpatient follow up for further recommendations. Dictated by:   Anselmo Rod, M.D. Attending Physician:  Charna Elizabeth DD:  01/01/02 TD:  01/02/02 Job: [id number]

## 2010-12-10 NOTE — Procedures (Signed)
[place]  Patient:    Sarah Chaney, Sarah Chaney Visit Number: [id number] MRN: [id number]          Service Type: END Location: ENDO Attending Physician:  Charna Elizabeth Dictated by:   Anselmo Rod, M.D. Proc. Date: 12/31/01 Admit Date:  12/31/2001 Discharge Date: 12/31/2001   CC:         Roxy Manns, M.D. [facility]  Cynthia P. Ashley Royalty, M.D.   Procedure Report  DATE OF BIRTH:  1960-10-26  PROCEDURES PERFORMED:  Esophagogastroduodenoscopy.  ENDOSCOPIST:  Anselmo Rod, M.D.  INSTRUMENTS USED:  Olympus video panendoscope.  INDICATIONS FOR PROCEDURE:  Epigastric pain and a history of severe reflux in a 50 year old white female, rule out peptic ulcer disease, esophagitis, gastritis. etc. Previous to preparation informed consent was procured from the patient. The patient was fasted for eight hours prior to the procedure. Pre procedure physical, stable vital signs, neck supple, chest clear to auscultation, S1, S2, regular, abdomen soft with normal bowel sounds.  DESCRIPTION OF THE PROCEDURE:  The patient was placed in the left lateral decubitus position and sedated with 120 mg of Demerol and 12 mg of Versed intravenously. Once the patient was adequately sedated and maintained on low flow oxygen and continuous cardiac monitoring, the Olympus video panendoscope was advanced through the mouth and teeth over the tongue into the esophagus.  Under direct vision the entire esophagus appeared normal without evidence of rings, stricture, masses, esophagitis or Barretts mucosa. The scope was then advanced to the stomach. The entire gastric mucosa and the proximal small bowel appeared normal. Retroflexion was not possible as the patient did not tolerate this very well.  IMPRESSION:  Essentially unrevealing EGD, retroflexion not done.  RECOMMENDATIONS 1. Continued PPIs. 2. Avoid all nonsteroidals. 3. Proceed with a colonoscopy at this time. 4. Further  recommendations will be made after the colonoscopy has been done. Dictated by:   Anselmo Rod, M.D. Attending Physician:  Charna Elizabeth DD:  01/01/02 TD:  01/02/02 Job: [id number]

## 2010-12-14 ENCOUNTER — Other Ambulatory Visit: Payer: Self-pay | Admitting: *Deleted

## 2010-12-14 ENCOUNTER — Other Ambulatory Visit: Payer: Self-pay | Admitting: Family Medicine

## 2010-12-14 MED ORDER — ZOLPIDEM TARTRATE 10 MG PO TABS: 10.0000 mg | ORAL_TABLET | Freq: Every evening | ORAL | Status: DC | PRN

## 2010-12-14 NOTE — Telephone Encounter (Signed)
Let her know that was because of new computer system -- will do refills this time  Px written for call in

## 2010-12-14 NOTE — Telephone Encounter (Signed)
Medication phoned to Sarah Chaney as instructed. Left message for pt to call back.

## 2010-12-14 NOTE — Telephone Encounter (Signed)
Patient is requesting that this have refills. She says that in the past she has gotten this w/ 5 refills and the last time she had it filled it had none.

## 2010-12-15 NOTE — Telephone Encounter (Signed)
Patient returned called and was notified.

## 2011-01-31 ENCOUNTER — Encounter: Payer: Self-pay | Admitting: *Deleted

## 2011-02-02 ENCOUNTER — Other Ambulatory Visit: Payer: 59 | Admitting: *Deleted

## 2011-02-12 ENCOUNTER — Other Ambulatory Visit: Payer: Self-pay | Admitting: Family Medicine

## 2011-02-18 ENCOUNTER — Other Ambulatory Visit: Payer: Self-pay | Admitting: Family Medicine

## 2011-02-18 NOTE — Telephone Encounter (Signed)
[facility] [address] electronically request refill Levothyroxine #30 x1 with note for pt to call for appt.

## 2011-02-28 ENCOUNTER — Other Ambulatory Visit: Payer: Self-pay | Admitting: Gastroenterology

## 2011-02-28 NOTE — Telephone Encounter (Signed)
NEEDS OFFICE VISIT FOR ANY FURTHER REFILLS! 

## 2011-04-12 ENCOUNTER — Telehealth: Payer: Self-pay | Admitting: Cardiovascular Disease

## 2011-04-12 NOTE — Telephone Encounter (Signed)
Pt calling due to increase in her BP. Pt states that her BP bottom number running in the 90's

## 2011-04-13 ENCOUNTER — Telehealth: Payer: Self-pay | Admitting: *Deleted

## 2011-04-13 MED ORDER — METOPROLOL TARTRATE 50 MG PO TABS: 50.0000 mg | ORAL_TABLET | Freq: Two times a day (BID) | ORAL | Status: DC

## 2011-04-13 MED ORDER — METOPROLOL TARTRATE 50 MG PO TABS: ORAL_TABLET | ORAL | Status: DC

## 2011-04-13 NOTE — Telephone Encounter (Signed)
Per last note, pt verified that she is NOT taking Metoprolol. She is only taking HCTZ once daily, does not have lisinopril/hctz left. She has had HA/back pain x 2 weeks as well as husband has been in hospital and she has incr stress. Notified pt could be underlying problem, she may also need to see PCP as well, but will send in Metop tart 50mg  BID, take 1/2 tab to start. This should help with HR/BP. Pt instructed to call back with results after starting med.

## 2011-04-13 NOTE — Telephone Encounter (Signed)
Spoke to pt when our computer system was down, she notified that her BP has been running higher at 140s/98 - 100 diast. She has not had as many palpitations as before (2 weeks ago). At last ov she was told to stop lisinopril/hctz and take only hctz prn for leg swelling and take extra 1/2 toprol for palps. Pt has been taking the extra 1/2 of toprol for 2 weeks now and her HR is now 92 and has been 84-100 past 2 weeks after extra dose. I can have pt come in for EKG, possible visit, please advise.

## 2011-04-13 NOTE — Telephone Encounter (Signed)
Does she have any lisinopril hctz left? She could take a 1/2 for BP and see if this brings it down. If none, she could take HCTZ one pill a day.

## 2011-04-13 NOTE — Telephone Encounter (Signed)
See following note 04/13/11.

## 2011-04-22 ENCOUNTER — Other Ambulatory Visit: Payer: Self-pay | Admitting: Family Medicine

## 2011-04-22 ENCOUNTER — Telehealth: Payer: Self-pay | Admitting: *Deleted

## 2011-04-22 DIAGNOSIS — Z1231 Encounter for screening mammogram for malignant neoplasm of breast: Secondary | ICD-10-CM | POA: Insufficient documentation

## 2011-04-22 NOTE — Telephone Encounter (Signed)
Ok to refill 

## 2011-04-22 NOTE — Telephone Encounter (Signed)
Will refill electronically Schedule check up for November or December please

## 2011-04-22 NOTE — Telephone Encounter (Signed)
Will order screening mam

## 2011-04-22 NOTE — Telephone Encounter (Signed)
Pt needs order for mammogram.  She goes to [place].

## 2011-04-26 ENCOUNTER — Telehealth: Payer: Self-pay | Admitting: *Deleted

## 2011-04-26 NOTE — Telephone Encounter (Signed)
Patient called stating that she had called last week for an order for a mammogram at [facility]. Patient stated that she called today and they have not gotten the order yet. Please let her know when this has been done.

## 2011-04-26 NOTE — Telephone Encounter (Signed)
MMG at [facility] on 05/20/2011 at 7am, order faxed. mK

## 2011-04-27 ENCOUNTER — Other Ambulatory Visit: Payer: Self-pay | Admitting: Gastroenterology

## 2011-04-27 MED ORDER — OMEPRAZOLE 40 MG PO CPDR: 40.0000 mg | DELAYED_RELEASE_CAPSULE | Freq: Two times a day (BID) | ORAL | Status: DC

## 2011-04-27 NOTE — Telephone Encounter (Signed)
Spoke with patient and informed her the reason it was denied is because she has not been seen in over a year. Patient states she will make a appointment when she gets home. I told her that I will send a refill but she needs to schedule and keep a appt for any further refills. Pt agreed.

## 2011-04-28 NOTE — Telephone Encounter (Signed)
Patient notified as instructed by telephone. Pt scheduled CPX 07/06/11 at 9am with fasting Lab appt scheduled as instructed 06/29/11 at 8:30am.

## 2011-05-03 ENCOUNTER — Ambulatory Visit (INDEPENDENT_AMBULATORY_CARE_PROVIDER_SITE_OTHER): Payer: BC Managed Care – PPO | Admitting: Family Medicine

## 2011-05-03 ENCOUNTER — Encounter: Payer: Self-pay | Admitting: Family Medicine

## 2011-05-03 VITALS — BP 130/90 | HR 65 | Temp 98.4°F | Ht 68.0 in | Wt 220.0 lb

## 2011-05-03 DIAGNOSIS — D649 Anemia, unspecified: Secondary | ICD-10-CM

## 2011-05-03 DIAGNOSIS — B370 Candidal stomatitis: Secondary | ICD-10-CM | POA: Insufficient documentation

## 2011-05-03 DIAGNOSIS — E039 Hypothyroidism, unspecified: Secondary | ICD-10-CM

## 2011-05-03 DIAGNOSIS — I10 Essential (primary) hypertension: Secondary | ICD-10-CM

## 2011-05-03 LAB — CBC WITH DIFFERENTIAL/PLATELET
Basophils Absolute: 0.1 10*3/uL (ref 0.0–0.1)
Eosinophils Absolute: 0.3 10*3/uL (ref 0.0–0.7)
Lymphocytes Relative: 21.6 % (ref 12.0–46.0)
Lymphs Abs: 2.6 10*3/uL (ref 0.7–4.0)
MCHC: 32.7 g/dL (ref 30.0–36.0)
Monocytes Relative: 7.9 % (ref 3.0–12.0)
Platelets: 238 10*3/uL (ref 150.0–400.0)
RDW: 15.8 % — ABNORMAL HIGH (ref 11.5–14.6)

## 2011-05-03 LAB — BASIC METABOLIC PANEL
Chloride: 103 mEq/L (ref 96–112)
Creatinine, Ser: 0.7 mg/dL (ref 0.4–1.2)
GFR: 98.8 mL/min (ref >60.00)
Potassium: 4.3 mEq/L (ref 3.5–5.1)

## 2011-05-03 LAB — T4, FREE: Free T4: 1 ng/dL (ref 0.60–1.60)

## 2011-05-03 LAB — TSH: TSH: 4.04 u[IU]/mL (ref 0.35–5.50)

## 2011-05-03 MED ORDER — FLUCONAZOLE 200 MG PO TABS: 200.0000 mg | ORAL_TABLET | Freq: Every day | ORAL | Status: DC

## 2011-05-03 NOTE — Patient Instructions (Signed)
Good to see you. We will call with your blood test results in a day or two. Please call me with an update of you are not getting better in next several days.

## 2011-05-03 NOTE — Progress Notes (Signed)
Subjective:    Patient ID: Sarah Chaney, female    DOB: June 24, 1961, 50 y.o.   MRN: [id number]  HPI  50 yo pt of Dr. Royden Purl here for complaint of soreness of tongue and cracking on sides of mouth bilaterally. No pain with swallowing.  Noticed it several days ago, getting progressively worse.  No recent tx with abx. Pt is not a diabetic. She does have FH of DM. No increased thirst or urination.  No pain with or difficulty swallowing.  Pt also does have h/o hypothyroidism.  Patient Active Problem List  Diagnoses  . HYPOTHYROIDISM  . PURE HYPERCHOLESTEROLEMIA  . OBESITY  . ANEMIA-NOS  . INSOMNIA, CHRONIC  . HYPERTENSION  . GERD  . IBS  . FATTY LIVER DISEASE  . POLYARTHRITIS  . CHONDROMALACIA PATELLA, LEFT  . PAIN IN JOINT, MULTIPLE SITES  . ROTATOR CUFF SYNDROME, LEFT  . LATERAL EPICONDYLITIS, LEFT  . UNSPECIFIED NEURALGIA NEURITIS AND RADICULITIS  . LEG PAIN, BILATERAL  . DIZZINESS  . HEADACHE  . TACHYCARDIA  . PALPITATIONS  . NAUSEA, CHRONIC  . HYPERGLYCEMIA  . POSITIVE PPD  . SEIZURES, HX OF  . COLONIC POLYPS, HX OF  . TOBACCO USE, QUIT  . PERIMENOPAUSAL STATUS  . RESTLESS LEG SYNDROME  . MIGRAINE VARIANT  . Other screening mammogram  . Ginette Pitman   Past Medical History  Diagnosis Date  . Anemia, unspecified   . Chondromalacia of patella   . Personal history of colonic polyps   . Dizziness and giddiness   . Other chronic nonalcoholic liver disease   . Esophageal reflux   . Headache   . Routine general medical examination at a health care facility   . Other abnormal glucose   . Unspecified essential hypertension   . Unspecified hypothyroidism   . Irritable bowel syndrome   . Persistent disorder of initiating or maintaining sleep   . Lateral epicondylitis  of elbow   . Pain in limb   . Variants of migraine, not elsewhere classified, without mention of intractable migraine without mention of status migrainosus   . Nausea alone   . Obesity, unspecified    . Other screening mammogram   . Pain in joint, multiple sites   . Palpitations   . Asymptomatic postmenopausal status (age-related) (natural)   . Unspecified polyarthropathy or polyarthritis, multiple sites   . 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 Pure hypercholesterolemia   . Restless legs syndrome (RLS)   . Disorders of bursae and tendons in shoulder region, unspecified   . Routine gynecological examination   . Personal history of other disorders of nervous system and sense organs   . Tachycardia, unspecified   . Personal history of tobacco use, presenting hazards to health   . Neuralgia, neuritis, and radiculitis, unspecified    Past Surgical History  Procedure Date  . Popliteal synovial cyst excision 12/2003  . Esophagogastroduodenoscopy 12/2001  . Colonoscopy 12/2001    Negative  . Polypectomy 12/2001  . Abd [place] 12/2002    Fatty liver  . Esophagogastroduodenoscopy 11/2007    nml  . Abd [place] 05/2010    Fatty liver, normal gall bladder and pancreas   History  Substance Use Topics  . Smoking status: Former Smoker    Quit date: 07/26/2007  . Smokeless tobacco: Never Used   Comment: light smoking since age 46  . Alcohol Use: Yes     once a month   Family History  Problem Relation  Age of Onset  . Diabetes type II Father   . Diabetes Mother   . Hyperlipidemia Mother   . Diabetes      neice   Allergies  Allergen Reactions  . Carbamazepine     REACTION: hives  . Diltiazem Hcl     REACTION: chills, fever  . Nifedipine   . Verapamil     REACTION: edema   Current Outpatient Prescriptions on File Prior to Visit  Medication Sig Dispense Refill  . hydrochlorothiazide 25 MG tablet Take 1 tablet (25 mg total) by mouth as needed.  30 tablet  6  . ibuprofen (ADVIL,MOTRIN) 800 MG tablet Take 800 mg by mouth every 8 (eight) hours as needed.        Marland Kitchen levothyroxine (SYNTHROID, LEVOTHROID) 150 MCG tablet TAKE ONE TABLET BY MOUTH EVERY DAY   30 tablet  4  . metoprolol (LOPRESSOR) 50 MG tablet Take 1/2 tablet twice daily for 1 week, then increase to 1 tablet twice daily.  60 tablet  6  . Multiple Vitamin (MULTIVITAMIN) tablet Take 1 tablet by mouth daily.        Marland Kitchen omeprazole (PRILOSEC) 40 MG capsule Take 1 capsule (40 mg total) by mouth 2 (two) times daily.  60 capsule  0  . pravastatin (PRAVACHOL) 20 MG tablet Take 0.5 tablets (10 mg total) by mouth every evening.  30 tablet  6  . zolpidem (AMBIEN) 10 MG tablet Take 1 tablet (10 mg total) by mouth at bedtime as needed for sleep.  30 tablet  5   The PMH, PSH, Social History, Family History, Medications, and allergies have been reviewed in [facility], and have been updated if relevant.    Review of Systems See HPI    Objective:   Physical Exam BP 130/90  Pulse 65  Temp(Src) 98.4 F (36.9 C) (Oral)  Ht 5\' 8"  (1.727 m)  Wt 220 lb (99.791 kg)  BMI 33.45 kg/m2  General:  Well-developed,well-nourished,in no acute distress; alert,appropriate and cooperative throughout examination Head:  normocephalic and atraumatic.   Eyes:  vision grossly intact, pupils equal, pupils round, and pupils reactive to light.   Ears:  R ear normal and L ear normal.   Nose:  no external deformity.   Mouth:  good dentition, pos cheilitis bilaterally, +thrush visible on tongue and buccal mucosa   Neck:  No deformities, masses, or tenderness noted. Lungs:  Normal respiratory effort, chest expands symmetrically. Lungs are clear to auscultation, no crackles or wheezes. Heart:  Normal rate and regular rhythm. S1 and S2 normal without gallop, murmur, click, rub or other extra sounds. Abdomen:  Bowel sounds positive,abdomen soft and non-tender without masses, organomegaly or hernias noted. Neurologic:  alert & oriented X3 and gait normal.   Skin:  Intact without suspicious lesions or rashes Psych:  Cognition and judgment appear intact. Alert and cooperative with normal attention span and concentration. No  apparent delusions, illusions, hallucinations    Assessment & Plan:   1. HYPOTHYROIDISM  TSH, CBC w/Diff, T4, free  2. Thrush   New.  Treat with oral diflucan given chelitis. Will check BMET, a1c and thyroid function to rule out other contributing factors. The patient indicates understanding of these issues and agrees with the plan.  HgB A1c

## 2011-05-04 ENCOUNTER — Telehealth: Payer: Self-pay | Admitting: *Deleted

## 2011-05-04 ENCOUNTER — Other Ambulatory Visit: Payer: Self-pay | Admitting: *Deleted

## 2011-05-04 ENCOUNTER — Telehealth: Payer: Self-pay

## 2011-05-04 ENCOUNTER — Other Ambulatory Visit: Payer: Self-pay | Admitting: Family Medicine

## 2011-05-04 MED ORDER — METFORMIN HCL 500 MG PO TABS: 500.0000 mg | ORAL_TABLET | Freq: Two times a day (BID) | ORAL | Status: DC

## 2011-05-04 MED ORDER — FLUCONAZOLE 200 MG PO TABS: 200.0000 mg | ORAL_TABLET | Freq: Every day | ORAL | Status: AC

## 2011-05-04 NOTE — Telephone Encounter (Signed)
Patient has been newly diagnosed with diabetes by Dr. Dayton Martes.  Dr. Dayton Martes put her on Metformin 500mg  twice daily, #60 and advised patient to follow up with Dr. Milinda Antis regarding her new diagnosis.  Patient has a CPX scheduled in December but patient wants to know if she should be seen sooner, and if she should be checking her blood sugar daily.  Advised patient that I would check with Dr. Milinda Antis and she would hear back from [place].  Please call patient at work during the day.

## 2011-05-04 NOTE — Telephone Encounter (Signed)
Thanks- I reviewed note and labs - I would like her to f/u with me before her physical for a diabetes visit  thanks

## 2011-05-04 NOTE — Telephone Encounter (Signed)
Pt called and left message at front desk she wanted her diabetes addressed before her scheduled appt. I called pt at work and mobile #s and left v/m for pt to call back.

## 2011-05-04 NOTE — Telephone Encounter (Signed)
Message left on patient's work voicemail to call and schedule appt with Dr. Milinda Antis.

## 2011-05-05 MED ORDER — BLOOD GLUCOSE METER KIT: PACK | Status: DC

## 2011-05-05 NOTE — Telephone Encounter (Signed)
Px written for call in   

## 2011-05-05 NOTE — Telephone Encounter (Signed)
Glucose monitor, test strips and lancets phoned to [facility] [address] pharmacy as instructed. Patient notified as instructed by telephone.

## 2011-05-05 NOTE — Telephone Encounter (Signed)
Pt request rx for a Rely On glucose meter, Rely On test strips and lancets. Rx needs to include directions with how often to test and diagnosis code. Pt said she started Metformin and her A1C was 7.9. Pt does not want to wait until her appt with Dr Milinda Antis on 06/01/11 to start testing blood sugar. Please advise. Pt pharmacy is [facility] [address] and pt can be reached at [phone] ext [phone]. Pt said if she does not answer leave detailed message on v/m.

## 2011-05-20 ENCOUNTER — Ambulatory Visit: Payer: Self-pay | Admitting: Family Medicine

## 2011-05-20 ENCOUNTER — Encounter: Payer: Self-pay | Admitting: Family Medicine

## 2011-05-23 ENCOUNTER — Encounter: Payer: Self-pay | Admitting: *Deleted

## 2011-05-25 ENCOUNTER — Ambulatory Visit: Payer: 59 | Admitting: Gastroenterology

## 2011-05-25 ENCOUNTER — Telehealth: Payer: Self-pay

## 2011-05-25 ENCOUNTER — Other Ambulatory Visit: Payer: Self-pay | Admitting: Family Medicine

## 2011-05-25 NOTE — Telephone Encounter (Signed)
Pt diagnosed 2 1/2 wks ago with diabetes and Dr Milinda Antis started on Metformin 500mg  taking 1 tab twice a day. After starting med pt has severe lower stomach pain and blood sugar dropping.  Pt started taking Metformin 500 mg one daily and the dropping blood sugar stopped but the sever lower stomach pain continued.  ON 05/23/11  Pt stopped medformin completely Last night pt cked 2-3 hr after eating blod sugar and it was 208 (the highest blood spugar pt has ever had). Pt did drink sweetened capicchino. Today fasting sugare  Was 169.  Pt said stomach pain has stopped but feels hot on and off and does not feel good. (could not explain more than that.  Pt has f/u appt with Dr Milinda Antis on 06/01/11 already scheduled but wants answer today what she should do about her blood sugar levels and also should she see Dr Milinda Antis sooner. Pt states she is scared. Advised pt to watch her diet carefully and avoid sweets and watch carb intake. Pt uses [facility] [address] and can be reached [phone] ext [phone] or cell [phone].Please advise.

## 2011-05-25 NOTE — Telephone Encounter (Signed)
Tried calling her cell phone with no answer. Will try again tomorrow.  Metformin causing the stomach difficulties. She needs to stop all sugar, sweets and really cut down on carbs. F/u with Dr Milinda Antis seems appropriate.

## 2011-05-26 NOTE — Telephone Encounter (Signed)
Left message at pts cell and work # for pt to call back.

## 2011-05-26 NOTE — Telephone Encounter (Signed)
Again left message on cell phone that Metformin probably caused the GI complaints. Having stopped it and eating sugar will allow BS to elevate. Suggested she start slow taper onto the med with 1/2 tab at night for a couple of days, then whole tab. After couple of days add 1/2 tab in AM and then increase to whole tab in a few days. Appt on 7th w/ Dr Milinda Antis as scheduled is reasonable. Do not think she needs to be seen sooner.

## 2011-05-26 NOTE — Telephone Encounter (Signed)
Patient notified as instructed by telephone. 

## 2011-05-27 NOTE — Telephone Encounter (Signed)
Px written for call in   

## 2011-05-27 NOTE — Telephone Encounter (Signed)
Rx called in as directed.   

## 2011-05-27 NOTE — Telephone Encounter (Signed)
OK to refill

## 2011-06-01 ENCOUNTER — Encounter: Payer: Self-pay | Admitting: Family Medicine

## 2011-06-01 ENCOUNTER — Ambulatory Visit (INDEPENDENT_AMBULATORY_CARE_PROVIDER_SITE_OTHER): Payer: BC Managed Care – PPO | Admitting: Family Medicine

## 2011-06-01 VITALS — BP 130/88 | HR 76 | Temp 98.1°F | Ht 68.0 in | Wt 223.2 lb

## 2011-06-01 DIAGNOSIS — I10 Essential (primary) hypertension: Secondary | ICD-10-CM

## 2011-06-01 DIAGNOSIS — E119 Type 2 diabetes mellitus without complications: Secondary | ICD-10-CM

## 2011-06-01 DIAGNOSIS — E039 Hypothyroidism, unspecified: Secondary | ICD-10-CM

## 2011-06-01 DIAGNOSIS — R7309 Other abnormal glucose: Secondary | ICD-10-CM

## 2011-06-01 DIAGNOSIS — G43809 Other migraine, not intractable, without status migrainosus: Secondary | ICD-10-CM

## 2011-06-01 MED ORDER — BLOOD GLUCOSE METER KIT: PACK | Status: AC

## 2011-06-01 MED ORDER — GLIPIZIDE ER 5 MG PO TB24: 5.0000 mg | ORAL_TABLET | Freq: Every day | ORAL | Status: DC

## 2011-06-01 NOTE — Patient Instructions (Signed)
We will do diabetic teaching referral at check out  Stop metformin  Start glucotrol xl 5 mg (glipizide)-- if low sugar or any side eff let me know When ready for headache clinic referral- just call [facility] when you can

## 2011-06-01 NOTE — Progress Notes (Signed)
Subjective:    Patient ID: Sarah Chaney, female    DOB: 09-06-1960, 50 y.o.   MRN: [id number]  HPI  Here for f/u of multiple chronic med problems incl HTN and hyperglycemia and hypothy and headaches   130/88 bp No cp or palp or  More headaches- then got some flashes in vision - / was overall ok  Has had aura before - this is a bit different  Lot of stress with her husband - he is having more surgery Gaylyn Rong is almost every day  Cannot tolerate tricyclic or gabapentin or topamax  Not ready for ref  A lot of it could be stress   Wbc 12-- stress related too she thinks  At time - she had thrush/ fungal infectoin   Hyperglycemia  Lab Results  Component Value Date   HGBA1C 7.9* 05/03/2011   on metformin- taking 1/2 pill in am and 1/2 pill in pm  A lot of gastric distress - and has gas pain - really intolerable  Is making herself eat breakfast - oatmeal/ yogurt/ whole wheat english muffin Lunch is good  Has had feelings of low sugar -- but sugar was normal - is usually 110 for example  Checks sugar bid  Has not done DM teaching  Am and 2 hours after a meal  Test strips - needs refil      Lipids Lab Results  Component Value Date   CHOL 231* 01/19/2010   CHOL 227* 09/18/2008   CHOL 206* 05/25/2007   Lab Results  Component Value Date   HDL 46.40 01/19/2010   HDL 02.7 09/18/2008   HDL 41.7 05/25/2007   No results found for this basename: LDLCALC   Lab Results  Component Value Date   TRIG 151.0* 01/19/2010   TRIG 126 09/18/2008   TRIG 67 05/25/2007   Lab Results  Component Value Date   CHOLHDL 5 01/19/2010   CHOLHDL 5.4 CALC 09/18/2008   CHOLHDL 4.9 CALC 05/25/2007   Lab Results  Component Value Date   LDLDIRECT 168.3 01/19/2010   LDLDIRECT 156.9 09/18/2008   LDLDIRECT 146.8 05/25/2007   on pravachol and diet  Control could be better - she admits this is from diet    Hypothyroid Lab Results  Component Value Date   TSH 4.04 05/03/2011   in good control - no changes    Patient Active Problem List  Diagnoses  . HYPOTHYROIDISM  . PURE HYPERCHOLESTEROLEMIA  . OBESITY  . ANEMIA-NOS  . INSOMNIA, CHRONIC  . HYPERTENSION  . GERD  . IBS  . FATTY LIVER DISEASE  . POLYARTHRITIS  . CHONDROMALACIA PATELLA, LEFT  . PAIN IN JOINT, MULTIPLE SITES  . ROTATOR CUFF SYNDROME, LEFT  . LATERAL EPICONDYLITIS, LEFT  . UNSPECIFIED NEURALGIA NEURITIS AND RADICULITIS  . LEG PAIN, BILATERAL  . DIZZINESS  . HEADACHE  . TACHYCARDIA  . PALPITATIONS  . NAUSEA, CHRONIC  . DM2 (diabetes mellitus, type 2)  . POSITIVE PPD  . SEIZURES, HX OF  . COLONIC POLYPS, HX OF  . TOBACCO USE, QUIT  . PERIMENOPAUSAL STATUS  . RESTLESS LEG SYNDROME  . MIGRAINE VARIANT  . Other screening mammogram  . Thrush  . DM (diabetes mellitus)   Past Medical History  Diagnosis Date  . Anemia, unspecified   . Chondromalacia of patella   . Gastric polyps   . GERD (gastroesophageal reflux disease)   . Headache   . Unspecified essential hypertension   . Unspecified hypothyroidism   . Irritable bowel  syndrome   . Persistent disorder of initiating or maintaining sleep   . Lateral epicondylitis  of elbow   . Variants of migraine, not elsewhere classified, without mention of intractable migraine without mention of status migrainosus   . Obesity, unspecified   . Palpitations   . Unspecified polyarthropathy or polyarthritis, multiple sites   . 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 Pure hypercholesterolemia   . Restless legs syndrome (RLS)   . Disorders of bursae and tendons in shoulder region, unspecified   . Tachycardia, unspecified   . Personal history of tobacco use, presenting hazards to health   . Neuralgia, neuritis, and radiculitis, unspecified   . Diverticulosis   . Adenomatous colon polyp 1997   Past Surgical History  Procedure Date  . Popliteal synovial cyst excision 12/2003  . Esophagogastroduodenoscopy 12/2001  . Colonoscopy 12/2001     Negative  . Polypectomy 12/2001  . Abd [place] 12/2002    Fatty liver  . Esophagogastroduodenoscopy 11/2007    nml  . Abd [place] 05/2010    Fatty liver, normal gall bladder and pancreas   History  Substance Use Topics  . Smoking status: Former Smoker    Quit date: 07/26/2007  . Smokeless tobacco: Never Used   Comment: light smoking since age 24  . Alcohol Use: Yes     once a month   Family History  Problem Relation Age of Onset  . Diabetes type II Father   . Diabetes Mother   . Hyperlipidemia Mother   . Diabetes      neice   Allergies  Allergen Reactions  . Carbamazepine     REACTION: hives  . Diltiazem Hcl     REACTION: chills, fever  . Metformin And Related     GI   . Nifedipine   . Verapamil     REACTION: edema   Current Outpatient Prescriptions on File Prior to Visit  Medication Sig Dispense Refill  . levothyroxine (SYNTHROID, LEVOTHROID) 150 MCG tablet TAKE ONE TABLET BY MOUTH EVERY DAY  30 tablet  4  . Multiple Vitamin (MULTIVITAMIN) tablet Take 1 tablet by mouth daily.        Marland Kitchen omeprazole (PRILOSEC) 40 MG capsule Take 1 capsule (40 mg total) by mouth 2 (two) times daily.  60 capsule  0  . pravastatin (PRAVACHOL) 20 MG tablet Take 0.5 tablets (10 mg total) by mouth every evening.  30 tablet  6  . zolpidem (AMBIEN) 10 MG tablet TAKE 1 TABLET BY MOUTH EVERY NIGHT AT BEDTIME  30 tablet  2  . ibuprofen (ADVIL,MOTRIN) 800 MG tablet Take 800 mg by mouth every 8 (eight) hours as needed.            Review of Systems Review of Systems  Constitutional: Negative for fever, appetite change,  and unexpected weight change. pos for fatigue  Eyes: Negative for pain and visual disturbance.  Respiratory: Negative for cough and shortness of breath.   Cardiovascular: Negative for cp or palpitations    Gastrointestinal: Negative for nausea, diarrhea and constipation.  Genitourinary: Negative for urgency and frequency.  Skin: Negative for pallor or rash   Neurological: Negative  for weakness, light-headedness, numbness and pos for ha  Hematological: Negative for adenopathy. Does not bruise/bleed easily.  Psychiatric/Behavioral: Negative for dysphoric mood. The patient is not nervous/anxious.  much stress however        Objective:   Physical Exam  Constitutional: She is oriented to person, place,  and time. She appears well-developed and well-nourished. No distress.       overwt and well appearing   HENT:  Head: Normocephalic and atraumatic.  Right Ear: External ear normal.  Left Ear: External ear normal.  Nose: Nose normal.  Mouth/Throat: Oropharynx is clear and moist.       No sinus tenderness   Eyes: Conjunctivae and EOM are normal. Pupils are equal, round, and reactive to light.  Neck: Normal range of motion. Neck supple. No JVD present. Carotid bruit is not present. No thyromegaly present.  Cardiovascular: Normal rate, regular rhythm, normal heart sounds and intact distal pulses.  Exam reveals no gallop.   Pulmonary/Chest: Effort normal and breath sounds normal. No respiratory distress. She has no wheezes.  Abdominal: Soft. Bowel sounds are normal. She exhibits no distension, no abdominal bruit and no mass. There is no tenderness.  Musculoskeletal: Normal range of motion. She exhibits no edema and no tenderness.  Lymphadenopathy:    She has no cervical adenopathy.  Neurological: She is alert and oriented to person, place, and time. She has normal reflexes. She displays no tremor. No cranial nerve deficit or sensory deficit. She exhibits normal muscle tone. She displays a negative Romberg sign. Coordination normal.  Skin: Skin is warm and dry. No rash noted. No erythema. No pallor.  Psychiatric: She has a normal mood and affect.       Very stressed but pleasant and talkative           Assessment & Plan:

## 2011-06-02 NOTE — Assessment & Plan Note (Signed)
This is worsening from stress and pt does not tolerate many of the prophylactic meds I am familiar with Disc lifestyle change  Disc med overuse ha  Will ref to ha clinic when she is ready- after her husb surgery

## 2011-06-02 NOTE — Assessment & Plan Note (Signed)
Lab Results  Component Value Date   TSH 4.04 05/03/2011   theraputic No clinical changes No dosing changes

## 2011-06-02 NOTE — Assessment & Plan Note (Signed)
bp in fair control at this time  No changes needed  Disc lifstyle change with low sodium diet and exercise   

## 2011-06-02 NOTE — Assessment & Plan Note (Signed)
Refer to DM teaching  Since not tol of metformin - will try glucotrol xl instead - with bid sugar checks to watch for hypoglycemia  Rev low glycemic diet and plan for exercise  F/u 3 mo  Lab Results  Component Value Date   HGBA1C 7.9* 05/03/2011

## 2011-06-03 ENCOUNTER — Other Ambulatory Visit: Payer: Self-pay | Admitting: Internal Medicine

## 2011-06-03 ENCOUNTER — Telehealth: Payer: Self-pay | Admitting: Internal Medicine

## 2011-06-03 MED ORDER — OMEPRAZOLE 40 MG PO CPDR: 40.0000 mg | DELAYED_RELEASE_CAPSULE | Freq: Two times a day (BID) | ORAL | Status: DC

## 2011-06-03 NOTE — Telephone Encounter (Signed)
Patient called and stated since you changed her medication to Glucotrol she is having gas.  She wanted to know if it was ok to take Gas-X with this medication.  Please advise.

## 2011-06-03 NOTE — Telephone Encounter (Signed)
That is fine 

## 2011-06-03 NOTE — Telephone Encounter (Signed)
Patient notified

## 2011-06-13 ENCOUNTER — Other Ambulatory Visit: Payer: Self-pay | Admitting: Cardiovascular Disease

## 2011-06-13 NOTE — Telephone Encounter (Signed)
Refill sent for HCTZ 25 mg daily sent to [facility] [address].

## 2011-06-26 ENCOUNTER — Telehealth: Payer: Self-pay | Admitting: Family Medicine

## 2011-06-26 DIAGNOSIS — E7800 Pure hypercholesterolemia, unspecified: Secondary | ICD-10-CM

## 2011-06-26 DIAGNOSIS — E119 Type 2 diabetes mellitus without complications: Secondary | ICD-10-CM

## 2011-06-26 DIAGNOSIS — Z0000 Encounter for general adult medical examination without abnormal findings: Secondary | ICD-10-CM | POA: Insufficient documentation

## 2011-06-26 DIAGNOSIS — E039 Hypothyroidism, unspecified: Secondary | ICD-10-CM

## 2011-06-26 DIAGNOSIS — K7689 Other specified diseases of liver: Secondary | ICD-10-CM

## 2011-06-26 DIAGNOSIS — D649 Anemia, unspecified: Secondary | ICD-10-CM

## 2011-06-26 NOTE — Telephone Encounter (Signed)
Message copied by Judy Pimple on Sun Jun 26, 2011  5:53 PM ------      Message from: Alvina Chou      Created: Wed Jun 22, 2011  3:57 PM      Regarding: orders, 12.5       Patient is scheduled for CPX labs, please order future labs, Thanks , Camelia Eng

## 2011-06-29 ENCOUNTER — Other Ambulatory Visit (INDEPENDENT_AMBULATORY_CARE_PROVIDER_SITE_OTHER): Payer: BC Managed Care – PPO

## 2011-06-29 DIAGNOSIS — E039 Hypothyroidism, unspecified: Secondary | ICD-10-CM

## 2011-06-29 DIAGNOSIS — E7800 Pure hypercholesterolemia, unspecified: Secondary | ICD-10-CM

## 2011-06-29 DIAGNOSIS — D649 Anemia, unspecified: Secondary | ICD-10-CM

## 2011-06-29 DIAGNOSIS — E119 Type 2 diabetes mellitus without complications: Secondary | ICD-10-CM

## 2011-06-29 DIAGNOSIS — K7689 Other specified diseases of liver: Secondary | ICD-10-CM

## 2011-06-29 DIAGNOSIS — Z0000 Encounter for general adult medical examination without abnormal findings: Secondary | ICD-10-CM

## 2011-06-29 LAB — CBC WITH DIFFERENTIAL/PLATELET
Basophils Absolute: 0.1 10*3/uL (ref 0.0–0.1)
Eosinophils Relative: 5.1 % — ABNORMAL HIGH (ref 0.0–5.0)
Lymphs Abs: 2.1 10*3/uL (ref 0.7–4.0)
MCV: 83.2 fl (ref 78.0–100.0)
Monocytes Absolute: 0.8 10*3/uL (ref 0.1–1.0)
Neutrophils Relative %: 54.9 % (ref 43.0–77.0)
Platelets: 187 10*3/uL (ref 150.0–400.0)
RDW: 16.4 % — ABNORMAL HIGH (ref 11.5–14.6)
WBC: 7.4 10*3/uL (ref 4.5–10.5)

## 2011-06-29 LAB — COMPREHENSIVE METABOLIC PANEL
AST: 27 U/L (ref 0–37)
Albumin: 3.8 g/dL (ref 3.5–5.2)
BUN: 16 mg/dL (ref 6–23)
Calcium: 9.2 mg/dL (ref 8.4–10.5)
Chloride: 104 mEq/L (ref 96–112)
Creatinine, Ser: 0.8 mg/dL (ref 0.4–1.2)
Glucose, Bld: 150 mg/dL — ABNORMAL HIGH (ref 70–99)

## 2011-06-29 LAB — HM DIABETES EYE EXAM: HM Diabetic Eye Exam: NORMAL

## 2011-06-29 LAB — HEMOGLOBIN A1C: Hgb A1c MFr Bld: 7.4 % — ABNORMAL HIGH (ref 4.6–6.5)

## 2011-06-29 LAB — LIPID PANEL
Cholesterol: 207 mg/dL — ABNORMAL HIGH (ref 0–200)
Total CHOL/HDL Ratio: 5
Triglycerides: 137 mg/dL (ref 0.0–149.0)

## 2011-07-06 ENCOUNTER — Encounter: Payer: Self-pay | Admitting: Family Medicine

## 2011-07-06 ENCOUNTER — Ambulatory Visit (INDEPENDENT_AMBULATORY_CARE_PROVIDER_SITE_OTHER): Payer: BC Managed Care – PPO | Admitting: Family Medicine

## 2011-07-06 VITALS — BP 125/88 | HR 72 | Temp 97.6°F | Ht 67.25 in | Wt 229.8 lb

## 2011-07-06 DIAGNOSIS — E7800 Pure hypercholesterolemia, unspecified: Secondary | ICD-10-CM

## 2011-07-06 DIAGNOSIS — D2360 Other benign neoplasm of skin of unspecified upper limb, including shoulder: Secondary | ICD-10-CM

## 2011-07-06 DIAGNOSIS — D2260 Melanocytic nevi of unspecified upper limb, including shoulder: Secondary | ICD-10-CM

## 2011-07-06 DIAGNOSIS — I10 Essential (primary) hypertension: Secondary | ICD-10-CM

## 2011-07-06 DIAGNOSIS — E039 Hypothyroidism, unspecified: Secondary | ICD-10-CM

## 2011-07-06 DIAGNOSIS — E119 Type 2 diabetes mellitus without complications: Secondary | ICD-10-CM

## 2011-07-06 DIAGNOSIS — Z0000 Encounter for general adult medical examination without abnormal findings: Secondary | ICD-10-CM

## 2011-07-06 DIAGNOSIS — Z23 Encounter for immunization: Secondary | ICD-10-CM

## 2011-07-06 NOTE — Assessment & Plan Note (Signed)
Reviewed health habits including diet and exercise and skin cancer prevention Also reviewed health mt list, fam hx and immunizations  Nl breast exam today Flu shot today

## 2011-07-06 NOTE — Progress Notes (Signed)
Subjective:    Patient ID: Sarah Chaney, female    DOB: 09-14-1960, 50 y.o.   MRN: [id number]  HPI Here for health maintenance exam and to review chronic medical problems   Is doing ok overall  Is trying hard to get DM under control  Going to start a zumba class    Flu shot- has not had it yet - needs that today   mammo 10/12-- fine  Self exam- no lumps   Gyn exam  Pap 7/11 normal -- last 3 were normal / never had abn pap / no new partners / no symptoms  colonosc 5/11   Wants to check moles on L cheek and R shoulder    Hypothyroid Lab Results  Component Value Date   TSH 2.92 06/29/2011   clinically  Lipids high Has tried statins - leg pain severe - crestor and pravachol  Lab Results  Component Value Date   CHOL 207* 06/29/2011   HDL 45.10 06/29/2011   LDLDIRECT 138.8 06/29/2011   TRIG 137.0 06/29/2011   CHOLHDL 5 06/29/2011   diet-is fair  Will start fish oil   Past hx of anemia has resolved Lab Results  Component Value Date   WBC 7.4 06/29/2011   HGB 13.8 06/29/2011   HCT 40.7 06/29/2011   MCV 83.2 06/29/2011   PLT 187.0 06/29/2011     bp is 120/90     Today No cp or palpitations or headaches or edema  No side effects to medicines   DM- some improvement On glucotrol xl 5- tolerating that ok , occ dropping low if she skips meal , does give her some gas  Intol of metformin  Lab Results  Component Value Date   HGBA1C 7.4* 06/29/2011   down from 7.9 Diet - is fair -- is avoiding sugar/ starch small portions/needs to increase her protien  All high fiber  Has not done education - did try to go to [place] -- was expensive - cannot afford it right now with husb out of work  Exercise- needs to start doing - is trying to get an elliptical machine  opthy - went last month -- no retinopathy Foot care - no numbness  Patient Active Problem List  Diagnoses  . HYPOTHYROIDISM  . PURE HYPERCHOLESTEROLEMIA  . OBESITY  . ANEMIA-NOS  . INSOMNIA, CHRONIC  . HYPERTENSION  .  GERD  . IBS  . FATTY LIVER DISEASE  . POLYARTHRITIS  . CHONDROMALACIA PATELLA, LEFT  . PAIN IN JOINT, MULTIPLE SITES  . ROTATOR CUFF SYNDROME, LEFT  . LATERAL EPICONDYLITIS, LEFT  . UNSPECIFIED NEURALGIA NEURITIS AND RADICULITIS  . LEG PAIN, BILATERAL  . DIZZINESS  . HEADACHE  . TACHYCARDIA  . PALPITATIONS  . NAUSEA, CHRONIC  . DM2 (diabetes mellitus, type 2)  . POSITIVE PPD  . SEIZURES, HX OF  . COLONIC POLYPS, HX OF  . TOBACCO USE, QUIT  . PERIMENOPAUSAL STATUS  . RESTLESS LEG SYNDROME  . MIGRAINE VARIANT  . Other screening mammogram  . Thrush  . Routine general medical examination at a health care facility  . Nevus of upper arm   Past Medical History  Diagnosis Date  . Anemia, unspecified   . Chondromalacia of patella   . Gastric polyps   . GERD (gastroesophageal reflux disease)   . Headache   . Unspecified essential hypertension   . Unspecified hypothyroidism   . Irritable bowel syndrome   . Persistent disorder of initiating or maintaining sleep   .  Lateral epicondylitis  of elbow   . Variants of migraine, not elsewhere classified, without mention of intractable migraine without mention of status migrainosus   . Obesity, unspecified   . Palpitations   . Unspecified polyarthropathy or polyarthritis, multiple sites   . 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 Pure hypercholesterolemia   . Restless legs syndrome (RLS)   . Disorders of bursae and tendons in shoulder region, unspecified   . Tachycardia, unspecified   . Personal history of tobacco use, presenting hazards to health   . Neuralgia, neuritis, and radiculitis, unspecified   . Diverticulosis   . Adenomatous colon polyp 1997   Past Surgical History  Procedure Date  . Popliteal synovial cyst excision 12/2003  . Esophagogastroduodenoscopy 12/2001  . Colonoscopy 12/2001    Negative  . Polypectomy 12/2001  . Abd [place] 12/2002    Fatty liver  . Esophagogastroduodenoscopy  11/2007    nml  . Abd [place] 05/2010    Fatty liver, normal gall bladder and pancreas   History  Substance Use Topics  . Smoking status: Former Smoker    Quit date: 07/26/2007  . Smokeless tobacco: Never Used   Comment: light smoking since age 20  . Alcohol Use: Yes     once a month   Family History  Problem Relation Age of Onset  . Diabetes type II Father   . Diabetes Mother   . Hyperlipidemia Mother   . Diabetes      neice   Allergies  Allergen Reactions  . Carbamazepine     REACTION: hives  . Diltiazem Hcl     REACTION: chills, fever  . Metformin And Related Other (See Comments)    GI   . Nifedipine   . Verapamil     REACTION: edema   Current Outpatient Prescriptions on File Prior to Visit  Medication Sig Dispense Refill  . Blood Glucose Monitoring Suppl (BLOOD GLUCOSE METER) kit Check glucose twice daily and as needed with rely on glucose meter and strips (please 100 strips) for dx of 250.0  100 each  11  . glipiZIDE (GLUCOTROL XL) 5 MG 24 hr tablet Take 1 tablet (5 mg total) by mouth daily.  30 tablet  11  . levothyroxine (SYNTHROID, LEVOTHROID) 150 MCG tablet TAKE ONE TABLET BY MOUTH EVERY DAY  30 tablet  4  . meloxicam (MOBIC) 15 MG tablet Take 15 mg by mouth daily.        . metoprolol (LOPRESSOR) 50 MG tablet Take 50 mg by mouth 2 (two) times daily.        Marland Kitchen omeprazole (PRILOSEC) 40 MG capsule Take 1 capsule (40 mg total) by mouth 2 (two) times daily.  60 capsule  11  . zolpidem (AMBIEN) 10 MG tablet TAKE 1 TABLET BY MOUTH EVERY NIGHT AT BEDTIME  30 tablet  2  . ibuprofen (ADVIL,MOTRIN) 800 MG tablet Take 800 mg by mouth every 8 (eight) hours as needed.        . Multiple Vitamin (MULTIVITAMIN) tablet Take 1 tablet by mouth daily.        . pravastatin (PRAVACHOL) 20 MG tablet Take 0.5 tablets (10 mg total) by mouth every evening.  30 tablet  6       Review of Systems Review of Systems  Constitutional: Negative for fever, appetite change, fatigue and unexpected  weight change.  Eyes: Negative for pain and visual disturbance.  Respiratory: Negative for cough and shortness of breath.  Cardiovascular: Negative for cp or palpitations    Gastrointestinal: Negative for nausea, diarrhea and constipation.  Genitourinary: Negative for urgency and frequency.  Endo : neg for excessive thirst or urination or wt loss  Skin: Negative for pallor or rash   Neurological: Negative for weakness, light-headedness, numbness and headaches.  Hematological: Negative for adenopathy. Does not bruise/bleed easily.  Psychiatric/Behavioral: Negative for dysphoric mood. The patient is not nervous/anxious.          Objective:   Physical Exam  Constitutional: She appears well-developed and well-nourished. No distress.       overwt and well appearing   HENT:  Head: Normocephalic and atraumatic.  Right Ear: External ear normal.  Left Ear: External ear normal.  Nose: Nose normal.  Mouth/Throat: Oropharynx is clear and moist.  Eyes: Conjunctivae and EOM are normal. Pupils are equal, round, and reactive to light. No scleral icterus.  Neck: Normal range of motion. Neck supple. No JVD present. Carotid bruit is not present. Erythema present. No thyromegaly present.  Cardiovascular: Normal rate, regular rhythm, normal heart sounds and intact distal pulses.  Exam reveals no gallop.   Pulmonary/Chest: Effort normal and breath sounds normal. No respiratory distress. She has no wheezes.  Abdominal: Soft. Bowel sounds are normal. She exhibits no distension, no abdominal bruit and no mass. There is no tenderness.  Genitourinary: No breast swelling, tenderness, discharge or bleeding.       Breast exam: No mass, nodules, thickening, tenderness, bulging, retraction, inflamation, nipple discharge or skin changes noted.  No axillary or clavicular LA.  Chaperoned exam.    Musculoskeletal: Normal range of motion. She exhibits no edema and no tenderness.  Lymphadenopathy:    She has no  cervical adenopathy.  Neurological: She is alert. She has normal reflexes. No cranial nerve deficit. She exhibits normal muscle tone. Coordination normal.  Skin: Skin is warm and dry. No rash noted. No erythema. No pallor.       2-3 mm waxy SK L temple 3-4 mm irregular nevus L shoulder / upper arm with inhomogenous color / shape  Somewhat shiny  Psychiatric: She has a normal mood and affect.          Assessment & Plan:

## 2011-07-06 NOTE — Assessment & Plan Note (Signed)
Raised irregular shiny nevus R shoulder - with inhomogenous color  Ref to derm ? Poss basal cell Disc sun protection

## 2011-07-06 NOTE — Assessment & Plan Note (Signed)
Not at goal but improving  Disc goals for lipids and reasons to control them Rev labs with pt Rev low sat fat diet in detail  Intol of statins Will re check 3 mo and f/u

## 2011-07-06 NOTE — Patient Instructions (Addendum)
Start fish oil 1000-3000 mg per day omega 3 Flu shot today Avoid red meat/ fried foods/ egg yolks/ fatty breakfast meats/ butter, cheese and high fat dairy/ and shellfish  - cholesterol foods  We will refer you to dermatology for mole on your shoulder  Schedule fasting lab and follow up in 3 months Aim for exercise 5 days per week

## 2011-07-06 NOTE — Assessment & Plan Note (Signed)
therapeutic TSH and no symptoms  No change in dose

## 2011-07-06 NOTE — Assessment & Plan Note (Signed)
Slowly improving - pt cannot afford DM ed yet  Rev low glycemic diet  Disc imp of exercise Hesitant to push the glipizide due to hypoglycemia  Will disc ace next visit opthy utd Lab and f/u 3 mo  Check sugar bid if able

## 2011-07-06 NOTE — Assessment & Plan Note (Signed)
Better on 2nd check  May consider ace in light of DM  bp in fair control at this time  No changes needed  Disc lifstyle change with low sodium diet and exercise

## 2011-09-25 ENCOUNTER — Other Ambulatory Visit: Payer: Self-pay | Admitting: Family Medicine

## 2011-09-29 ENCOUNTER — Other Ambulatory Visit: Payer: BC Managed Care – PPO

## 2011-10-05 ENCOUNTER — Ambulatory Visit: Payer: BC Managed Care – PPO | Admitting: Family Medicine

## 2011-10-20 ENCOUNTER — Ambulatory Visit (INDEPENDENT_AMBULATORY_CARE_PROVIDER_SITE_OTHER)
Admission: RE | Admit: 2011-10-20 | Discharge: 2011-10-20 | Disposition: A | Discharge status: Home / Self Care | Payer: BC Managed Care – PPO | Source: Ambulatory Visit | Attending: Family Medicine | Admitting: Family Medicine

## 2011-10-20 ENCOUNTER — Encounter: Payer: Self-pay | Admitting: Family Medicine

## 2011-10-20 ENCOUNTER — Other Ambulatory Visit: Payer: Self-pay | Admitting: Family Medicine

## 2011-10-20 ENCOUNTER — Ambulatory Visit (INDEPENDENT_AMBULATORY_CARE_PROVIDER_SITE_OTHER): Payer: BC Managed Care – PPO | Admitting: Family Medicine

## 2011-10-20 DIAGNOSIS — R109 Unspecified abdominal pain: Secondary | ICD-10-CM | POA: Insufficient documentation

## 2011-10-20 DIAGNOSIS — R9389 Abnormal findings on diagnostic imaging of other specified body structures: Secondary | ICD-10-CM

## 2011-10-20 LAB — COMPREHENSIVE METABOLIC PANEL
ALT: 45 U/L — ABNORMAL HIGH (ref 0–35)
AST: 32 U/L (ref 0–37)
Alkaline Phosphatase: 73 U/L (ref 39–117)
CO2: 28 mEq/L (ref 19–32)
Creatinine, Ser: 0.7 mg/dL (ref 0.4–1.2)
Sodium: 139 mEq/L (ref 135–145)
Total Bilirubin: 0.3 mg/dL (ref 0.3–1.2)
Total Protein: 6.7 g/dL (ref 6.0–8.3)

## 2011-10-20 LAB — CBC WITH DIFFERENTIAL/PLATELET
Basophils Absolute: 0.1 10*3/uL (ref 0.0–0.1)
HCT: 41.7 % (ref 36.0–46.0)
Hemoglobin: 14 g/dL (ref 12.0–15.0)
Lymphs Abs: 2.4 10*3/uL (ref 0.7–4.0)
MCHC: 33.5 g/dL (ref 30.0–36.0)
MCV: 85 fl (ref 78.0–100.0)
Monocytes Absolute: 0.8 10*3/uL (ref 0.1–1.0)
Neutro Abs: 5.2 10*3/uL (ref 1.4–7.7)
Platelets: 207 10*3/uL (ref 150.0–400.0)
RDW: 14 % (ref 11.5–14.6)

## 2011-10-20 LAB — LIPASE: Lipase: 30 U/L (ref 11.0–59.0)

## 2011-10-20 MED ORDER — IOHEXOL 300 MG/ML  SOLN
100.0000 mL | Freq: Once | INTRAMUSCULAR | Status: AC | PRN
  Administered 2011-10-20: 100 mL via INTRAVENOUS

## 2011-10-20 NOTE — Patient Instructions (Signed)
Good to see you. Please stop by to see Shirlee Limerick on your [place] out set up your CT scan. We will call you with your lab results.

## 2011-10-20 NOTE — Progress Notes (Signed)
Subjective:    Patient ID: Sarah Chaney, female    DOB: [birth year], 51 y.o.   MRN: [id number]  HPI  51 yo pt of Dr. Milinda Antis here for abdominal pain, nausea.  6 days ago woke up at 4 am, nauseated and vomited twice. Vomitus was clear, non bilious, non bloody. Has had episodes of intermittent nausea since, with no further vomiting. Now also having RUQ, RLQ pain that radiates to her back. No diarrhea. Never had anything like this before.  Does not feel like her GERD symptoms. Food does seem to make it a little worse.   Patient Active Problem List  Diagnoses  . HYPOTHYROIDISM  . PURE HYPERCHOLESTEROLEMIA  . OBESITY  . ANEMIA-NOS  . INSOMNIA, CHRONIC  . HYPERTENSION  . GERD  . IBS  . FATTY LIVER DISEASE  . POLYARTHRITIS  . CHONDROMALACIA PATELLA, LEFT  . PAIN IN JOINT, MULTIPLE SITES  . ROTATOR CUFF SYNDROME, LEFT  . LATERAL EPICONDYLITIS, LEFT  . UNSPECIFIED NEURALGIA NEURITIS AND RADICULITIS  . LEG PAIN, BILATERAL  . DIZZINESS  . HEADACHE  . TACHYCARDIA  . PALPITATIONS  . NAUSEA, CHRONIC  . DM2 (diabetes mellitus, type 2)  . POSITIVE PPD  . SEIZURES, HX OF  . COLONIC POLYPS, HX OF  . TOBACCO USE, QUIT  . PERIMENOPAUSAL STATUS  . RESTLESS LEG SYNDROME  . MIGRAINE VARIANT  . Other screening mammogram  . Thrush  . Routine general medical examination at a health care facility  . Nevus of upper arm  . Abdominal pain   Past Medical History  Diagnosis Date  . Anemia, unspecified   . Chondromalacia of patella   . Gastric polyps   . GERD (gastroesophageal reflux disease)   . Headache   . Unspecified essential hypertension   . Unspecified hypothyroidism   . Irritable bowel syndrome   . Persistent disorder of initiating or maintaining sleep   . Lateral epicondylitis  of elbow   . Variants of migraine, not elsewhere classified, without mention of intractable migraine without mention of status migrainosus   . Obesity, unspecified   . Palpitations   . Unspecified  polyarthropathy or polyarthritis, multiple sites   . 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 Pure hypercholesterolemia   . Restless legs syndrome (RLS)   . Disorders of bursae and tendons in shoulder region, unspecified   . Tachycardia, unspecified   . Personal history of tobacco use, presenting hazards to health   . Neuralgia, neuritis, and radiculitis, unspecified   . Diverticulosis   . Adenomatous colon polyp 1997   Past Surgical History  Procedure Date  . Popliteal synovial cyst excision 12/2003  . Esophagogastroduodenoscopy 12/2001  . Colonoscopy 12/2001    Negative  . Polypectomy 12/2001  . Abd [place] 12/2002    Fatty liver  . Esophagogastroduodenoscopy 11/2007    nml  . Abd [place] 05/2010    Fatty liver, normal gall bladder and pancreas   History  Substance Use Topics  . Smoking status: Current Some Day Smoker    Last Attempt to Quit: 07/26/2007  . Smokeless tobacco: Never Used   Comment: light smoking since age 14  . Alcohol Use: Yes     once a month   Family History  Problem Relation Age of Onset  . Diabetes type II Father   . Diabetes Mother   . Hyperlipidemia Mother   . Diabetes      neice   Allergies  Allergen Reactions  .  Carbamazepine     REACTION: hives  . Diltiazem Hcl     REACTION: chills, fever  . Metformin And Related Other (See Comments)    GI   . Nifedipine   . Verapamil     REACTION: edema   Current Outpatient Prescriptions on File Prior to Visit  Medication Sig Dispense Refill  . Blood Glucose Monitoring Suppl (BLOOD GLUCOSE METER) kit Check glucose twice daily and as needed with rely on glucose meter and strips (please 100 strips) for dx of 250.0  100 each  11  . glipiZIDE (GLUCOTROL XL) 5 MG 24 hr tablet Take 1 tablet (5 mg total) by mouth daily.  30 tablet  11  . hydrochlorothiazide (HYDRODIURIL) 25 MG tablet        . levothyroxine (SYNTHROID, LEVOTHROID) 150 MCG tablet TAKE ONE TABLET BY MOUTH EVERY DAY   30 tablet  6  . meloxicam (MOBIC) 15 MG tablet Take 15 mg by mouth daily.        . metoprolol (LOPRESSOR) 50 MG tablet Take 50 mg by mouth 2 (two) times daily.        . Multiple Vitamin (MULTIVITAMIN) tablet Take 1 tablet by mouth daily.        Marland Kitchen omeprazole (PRILOSEC) 40 MG capsule Take 1 capsule (40 mg total) by mouth 2 (two) times daily.  60 capsule  11  . zolpidem (AMBIEN) 10 MG tablet TAKE 1 TABLET BY MOUTH EVERY NIGHT AT BEDTIME  30 tablet  2  . ibuprofen (ADVIL,MOTRIN) 800 MG tablet Take 800 mg by mouth every 8 (eight) hours as needed.        . pravastatin (PRAVACHOL) 20 MG tablet Take 0.5 tablets (10 mg total) by mouth every evening.  30 tablet  6  . DISCONTD: hydrochlorothiazide 25 MG tablet Take 1 tablet (25 mg total) by mouth as needed.  30 tablet  6   The PMH, PSH, Social History, Family History, Medications, and allergies have been reviewed in [facility], and have been updated if relevant.  Review of Systems    Afebrile. No CP or SOB. Decreased appetite- feels full Objective:   Physical Exam  Constitutional: She is oriented to person, place, and time. She appears well-developed and well-nourished. No distress.  HENT:  Head: Normocephalic and atraumatic.  Abdominal: Soft. She exhibits no distension. There is no hepatosplenomegaly. There is tenderness. There is no rebound, no guarding and no CVA tenderness.    Musculoskeletal: Normal range of motion.  Neurological: She is alert and oriented to person, place, and time.  Skin: Skin is warm.  Psychiatric: She has a normal mood and affect. Her behavior is normal.   BP 140/100  Pulse 64  Temp(Src) 97.6 F (36.4 C) (Oral)  Wt 235 lb (106.595 kg)     Assessment & Plan:   1. Abdominal pain  CT Abdomen Pelvis W Wo Contrast, Comprehensive metabolic panel, CBC with Differential, Lipase   New with wide differential diagnosis. Could be viral gastroenteritis but given her other symptoms, will need further work up. Given the diffuse  nature and radiation of her pain- will order  CT of abdomen and pelvis- rule out gallstones, other biliary pathology, appendicitis although her symptoms are not classic for either. Check CMET, CBC and lipase as well. The patient indicates understanding of these issues and agrees with the plan.

## 2011-10-20 NOTE — Progress Notes (Deleted)
BP 140/100  Pulse 64  Temp(Src) 97.6 F (36.4 C) (Oral)  Wt 235 lb (106.595 kg) BP Readings from Last 3 Encounters:  10/20/11 140/100  07/06/11 125/88  06/01/11 130/88   Friday 4 am- nauseated, 6 am- threw up twice. Was clear liquid.  No relief after vomiting. Waves of nausea. Feels full

## 2011-10-24 ENCOUNTER — Telehealth: Payer: Self-pay | Admitting: Family Medicine

## 2011-10-24 NOTE — Telephone Encounter (Signed)
Called patient to schedule the MRI Abdomen. She said she was feeling better and wants to cancel having it. Said  She will call us back if she starts feeling bad again but right now doesn't want to have the MRI. Can you please cancel your order in Epic as I cant cancel.

## 2011-10-25 ENCOUNTER — Other Ambulatory Visit: Payer: Self-pay | Admitting: Family Medicine

## 2011-10-25 NOTE — Telephone Encounter (Signed)
Rx called to [facility]. 

## 2011-10-25 NOTE — Telephone Encounter (Signed)
Px written for call in   

## 2011-10-27 ENCOUNTER — Other Ambulatory Visit: Payer: Self-pay | Admitting: Family Medicine

## 2011-11-23 HISTORY — PX: OTHER SURGICAL HISTORY: SHX169

## 2011-11-28 ENCOUNTER — Encounter: Payer: Self-pay | Admitting: Cardiovascular Disease

## 2011-11-28 ENCOUNTER — Ambulatory Visit (INDEPENDENT_AMBULATORY_CARE_PROVIDER_SITE_OTHER): Payer: BC Managed Care – PPO | Admitting: Cardiovascular Disease

## 2011-11-28 DIAGNOSIS — R079 Chest pain, unspecified: Secondary | ICD-10-CM

## 2011-11-28 DIAGNOSIS — E119 Type 2 diabetes mellitus without complications: Secondary | ICD-10-CM

## 2011-11-28 DIAGNOSIS — Z87891 Personal history of nicotine dependence: Secondary | ICD-10-CM

## 2011-11-28 DIAGNOSIS — R0789 Other chest pain: Secondary | ICD-10-CM

## 2011-11-28 DIAGNOSIS — I10 Essential (primary) hypertension: Secondary | ICD-10-CM

## 2011-11-28 DIAGNOSIS — R0602 Shortness of breath: Secondary | ICD-10-CM

## 2011-11-28 NOTE — Assessment & Plan Note (Signed)
Blood pressure is well controlled on today's visit. No changes made to the medications. 

## 2011-11-28 NOTE — Progress Notes (Signed)
Patient ID: Sarah Chaney, female    DOB: 1961-02-09, 51 y.o.   MRN: [id number]  HPI Comments: Ms. Sarah Chaney is a very pleasant 51 year old woman with a history of hypertension, hyperlipidemia, palpitations who was previously  for palpitations who has been relatively well controlled on Toprol who presents for routine followup. 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 She recently stopped smoking.  She reports that over the past one to 2 weeks, she has had some chest tightening, a sensation that she is going to choke. Also pain down her right arm. This has happened at least 5 times. She has had rare episodes of wheezing. She reports an episode on April 16 when she was exercising in a dance class and she felt that she was "going to die ". She was very short of breath and had to stop. She reports that this is not like her she is typically able to exercise. She has stopped smoking since that time. She is concerned about her symptoms. She reports that she was recently diagnosed with diabetes. She is planning on exercising more this year.  She does take proton pump inhibitor for GERD. She also reports having slow GI motility with occasional belching and regurgitation of food.  EKG shows normal sinus rhythm with rate 80 beats per minute with no significant ST or T wave changes.  Outpatient Encounter Prescriptions as of 11/28/2011  Medication Sig Dispense Refill  . Blood Glucose Monitoring Suppl (BLOOD GLUCOSE METER) kit Check glucose twice daily and as needed with rely on glucose meter and strips (please 100 strips) for dx of 250.0  100 each  11  . glipiZIDE (GLUCOTROL XL) 5 MG 24 hr tablet Take 1 tablet (5 mg total) by mouth daily.  30 tablet  11  . hydrochlorothiazide (HYDRODIURIL) 25 MG tablet        . levothyroxine (SYNTHROID, LEVOTHROID) 150 MCG tablet TAKE ONE TABLET BY MOUTH EVERY DAY  30 tablet  6  . meloxicam (MOBIC) 15 MG tablet Take 15 mg by mouth daily.        . metoprolol  (LOPRESSOR) 50 MG tablet Take 50 mg by mouth 2 (two) times daily.        Marland Kitchen omeprazole (PRILOSEC) 40 MG capsule Take 1 capsule (40 mg total) by mouth 2 (two) times daily.  60 capsule  11  . zolpidem (AMBIEN) 10 MG tablet TAKE ONE TABLET BY MOUTH AT BEDTIME AS NEEDED FOR SLEEP  30 tablet  2  . pravastatin (PRAVACHOL) 20 MG tablet Take 0.5 tablets (10 mg total) by mouth every evening.  30 tablet  6   Review of Systems  Constitutional: Negative.   HENT: Negative.   Eyes: Negative.   Respiratory: Positive for chest tightness and shortness of breath.   Gastrointestinal: Negative.   Musculoskeletal: Negative.   Skin: Negative.   Neurological: Negative.   Hematological: Negative.   Psychiatric/Behavioral: Negative.   All other systems reviewed and are negative.   BP 136/86  Pulse 80  Ht 5\' 8"  (1.727 m)  Wt 234 lb (106.142 kg)  BMI 35.58 kg/m2  Physical Exam  Nursing note and vitals reviewed. Constitutional: She is oriented to person, place, and time. She appears well-developed and well-nourished.  HENT:  Head: Normocephalic.  Nose: Nose normal.  Mouth/Throat: Oropharynx is clear and moist.  Eyes: Conjunctivae are normal. Pupils are equal, round, and reactive to light.  Neck: Normal range of motion. Neck supple. No JVD present.  Cardiovascular: Normal  rate, regular rhythm, S1 normal, S2 normal, normal heart sounds and intact distal pulses.  Exam reveals no gallop and no friction rub.   No murmur heard. Pulmonary/Chest: Effort normal and breath sounds normal. No respiratory distress. She has no wheezes. She has no rales. She exhibits no tenderness.  Abdominal: Soft. Bowel sounds are normal. She exhibits no distension. There is no tenderness.  Musculoskeletal: Normal range of motion. She exhibits no edema and no tenderness.  Lymphadenopathy:    She has no cervical adenopathy.  Neurological: She is alert and oriented to person, place, and time. Coordination normal.  Skin: Skin is warm  and dry. No rash noted. No erythema.  Psychiatric: She has a normal mood and affect. Her behavior is normal. Judgment and thought content normal.         Assessment and Plan

## 2011-11-28 NOTE — Assessment & Plan Note (Signed)
Etiology of her chest tightness is uncertain. We will schedule her for a routine treadmill study. She does have a history of smoking, hyperlipidemia. She does not want nitroglycerin given previous history of headache with nitroglycerin.

## 2011-11-28 NOTE — Patient Instructions (Addendum)
You are doing well. No medication changes were made.  Please try red yeast rice for cholesterol (2 to 4 pills a day) We will schedule a routine treadmill study on a wednesday  Please call us if you have new issues that need to be addressed before your next appt.

## 2011-11-28 NOTE — Assessment & Plan Note (Signed)
She reports having shortness of breath with exertion. We will schedule a routine treadmill study. We have not ordered an echocardiogram given essentially benign EKG and clinical exam. This could be ordered at a later date if needed.

## 2011-11-28 NOTE — Assessment & Plan Note (Signed)
We have encouraged continued exercise, careful diet management in an effort to lose weight. 

## 2011-11-28 NOTE — Assessment & Plan Note (Signed)
She stopped smoking several weeks ago. We have recommended continued smoking cessation

## 2011-12-07 ENCOUNTER — Ambulatory Visit: Payer: Self-pay | Admitting: Physician Assistant

## 2011-12-07 ENCOUNTER — Ambulatory Visit (INDEPENDENT_AMBULATORY_CARE_PROVIDER_SITE_OTHER): Payer: BC Managed Care – PPO | Admitting: Cardiovascular Disease

## 2011-12-07 DIAGNOSIS — R079 Chest pain, unspecified: Secondary | ICD-10-CM

## 2011-12-07 NOTE — Patient Instructions (Signed)
Normal exercise treadmill study No significant ischemia noted, good exercise tolerance. No further workup needed at this time Please call our office if symptoms become more frequent or intense as further workup may be indicated.

## 2011-12-07 NOTE — Procedures (Signed)
Exercise Treadmill Test  Treadmill ordered for recent epsiodes of chest pain.  Resting EKG shows NSR with rate of 71 bpm,  Resting blood pressure of 118/82. Stand bruce protocal was used.  Patient exercised for 7 minutes Peak heart rate of 146 bpm.  This was 86% of the maximum predicted heart rate (target heart rate 143). Achieved 10.1 METS No symptoms of chest pain or lightheadedness were reported at peak stress or in recovery.  Peak Blood pressure recorded was  160/80 Heart rate at 3 minutes in recovery was 92 bpm. No significant ST or T wave changes concerning for ischemia  FINAL IMPRESSION: Normal exercise stress test. No significant EKG changes concerning for ischemia. Excellent exercise tolerance.

## 2011-12-08 ENCOUNTER — Encounter: Payer: Self-pay | Admitting: Family Medicine

## 2011-12-26 ENCOUNTER — Ambulatory Visit (INDEPENDENT_AMBULATORY_CARE_PROVIDER_SITE_OTHER): Payer: BC Managed Care – PPO | Admitting: Family Medicine

## 2011-12-26 ENCOUNTER — Ambulatory Visit (INDEPENDENT_AMBULATORY_CARE_PROVIDER_SITE_OTHER)
Admission: RE | Admit: 2011-12-26 | Discharge: 2011-12-26 | Disposition: A | Discharge status: Home / Self Care | Payer: BC Managed Care – PPO | Source: Ambulatory Visit | Attending: Family Medicine | Admitting: Family Medicine

## 2011-12-26 ENCOUNTER — Encounter: Payer: Self-pay | Admitting: Family Medicine

## 2011-12-26 VITALS — BP 140/90 | HR 64 | Temp 97.5°F | Ht 67.25 in | Wt 243.0 lb

## 2011-12-26 DIAGNOSIS — R062 Wheezing: Secondary | ICD-10-CM

## 2011-12-26 DIAGNOSIS — Z87891 Personal history of nicotine dependence: Secondary | ICD-10-CM

## 2011-12-26 DIAGNOSIS — R0789 Other chest pain: Secondary | ICD-10-CM

## 2011-12-26 MED ORDER — ALBUTEROL SULFATE HFA 108 (90 BASE) MCG/ACT IN AERS: 2.0000 | INHALATION_SPRAY | RESPIRATORY_TRACT | Status: DC | PRN

## 2011-12-26 NOTE — Progress Notes (Signed)
Subjective:    Patient ID: Sarah Chaney, female    DOB: October 14, 1960, 51 y.o.   MRN: [id number]  HPI Is here for wheezing  Going on for a while  Pressure feeling in chest  Also talks and runs out of breath  Not too much coughing - just a bit - occ feels a rattle  Also a "funky" taste in mouth- not like thrush though  Not a lot of post nasal drip   Is on metoprolol- has been on a while but no problems  For palpitations  Not more acid reflux -- omeprazole wipes out her symptoms   Friday thought she was getting a cold  Sneezed all day   Smoked lightly from age 29- to 11/08/11 Was having sob - and quit     Did see Dr Mariah Milling for chest pressure - and had a stress test - was fine  Now thinks it was respiratory  Patient Active Problem List  Diagnoses  . HYPOTHYROIDISM  . PURE HYPERCHOLESTEROLEMIA  . OBESITY  . ANEMIA-NOS  . INSOMNIA, CHRONIC  . HYPERTENSION  . GERD  . IBS  . FATTY LIVER DISEASE  . POLYARTHRITIS  . CHONDROMALACIA PATELLA, LEFT  . PAIN IN JOINT, MULTIPLE SITES  . ROTATOR CUFF SYNDROME, LEFT  . LATERAL EPICONDYLITIS, LEFT  . UNSPECIFIED NEURALGIA NEURITIS AND RADICULITIS  . LEG PAIN, BILATERAL  . DIZZINESS  . HEADACHE  . TACHYCARDIA  . PALPITATIONS  . NAUSEA, CHRONIC  . DM2 (diabetes mellitus, type 2)  . POSITIVE PPD  . SEIZURES, HX OF  . COLONIC POLYPS, HX OF  . TOBACCO USE, QUIT  . PERIMENOPAUSAL STATUS  . RESTLESS LEG SYNDROME  . MIGRAINE VARIANT  . Other screening mammogram  . Thrush  . Routine general medical examination at a health care facility  . Nevus of upper arm  . Abdominal pain  . Chest tightness  . Shortness of breath  . Wheeze   Past Medical History  Diagnosis Date  . Anemia, unspecified   . Chondromalacia of patella   . Gastric polyps   . GERD (gastroesophageal reflux disease)   . Headache   . Unspecified essential hypertension   . Unspecified hypothyroidism   . Irritable bowel syndrome   . Persistent disorder of  initiating or maintaining sleep   . Lateral epicondylitis  of elbow   . Variants of migraine, not elsewhere classified, without mention of intractable migraine without mention of status migrainosus   . Obesity, unspecified   . Palpitations   . Unspecified polyarthropathy or polyarthritis, multiple sites   . 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 Pure hypercholesterolemia   . Restless legs syndrome (RLS)   . Disorders of bursae and tendons in shoulder region, unspecified   . Tachycardia, unspecified   . Personal history of tobacco use, presenting hazards to health   . Neuralgia, neuritis, and radiculitis, unspecified   . Diverticulosis   . Adenomatous colon polyp 1997   Past Surgical History  Procedure Date  . Popliteal synovial cyst excision 12/2003  . Esophagogastroduodenoscopy 12/2001  . Colonoscopy 12/2001    Negative  . Polypectomy 12/2001  . Abd [place] 12/2002    Fatty liver  . Esophagogastroduodenoscopy 11/2007    nml  . Abd [place] 05/2010    Fatty liver, normal gall bladder and pancreas  . Exercise treadmill 5/13    normal   History  Substance Use Topics  . Smoking status: Former Smoker  Quit date: 11/06/2011  . Smokeless tobacco: Never Used   Comment: light smoking since age 18  . Alcohol Use: Yes     once a month   Family History  Problem Relation Age of Onset  . Diabetes type II Father   . Diabetes Mother   . Hyperlipidemia Mother   . Diabetes      neice   Allergies  Allergen Reactions  . Carbamazepine     REACTION: hives  . Diltiazem Hcl     REACTION: chills, fever  . Metformin And Related Other (See Comments)    GI   . Nifedipine   . Verapamil     REACTION: edema   Current Outpatient Prescriptions on File Prior to Visit  Medication Sig Dispense Refill  . Blood Glucose Monitoring Suppl (BLOOD GLUCOSE METER) kit Check glucose twice daily and as needed with rely on glucose meter and strips (please 100 strips) for dx  of 250.0  100 each  11  . glipiZIDE (GLUCOTROL XL) 5 MG 24 hr tablet Take 1 tablet (5 mg total) by mouth daily.  30 tablet  11  . hydrochlorothiazide (HYDRODIURIL) 25 MG tablet Take 25 mg by mouth daily.       Marland Kitchen levothyroxine (SYNTHROID, LEVOTHROID) 150 MCG tablet TAKE ONE TABLET BY MOUTH EVERY DAY  30 tablet  6  . meloxicam (MOBIC) 15 MG tablet Take 15 mg by mouth daily.        . metoprolol (LOPRESSOR) 50 MG tablet Take 50 mg by mouth 2 (two) times daily.        Marland Kitchen zolpidem (AMBIEN) 10 MG tablet TAKE ONE TABLET BY MOUTH AT BEDTIME AS NEEDED FOR SLEEP  30 tablet  2  . albuterol (PROVENTIL HFA;VENTOLIN HFA) 108 (90 BASE) MCG/ACT inhaler Inhale 2 puffs into the lungs every 4 (four) hours as needed for wheezing.  1 Inhaler  0  . omeprazole (PRILOSEC) 40 MG capsule Take 1 capsule (40 mg total) by mouth 2 (two) times daily.  60 capsule  11  . pravastatin (PRAVACHOL) 20 MG tablet Take 0.5 tablets (10 mg total) by mouth every evening.  30 tablet  6      Review of Systems Review of Systems  Constitutional: Negative for fever, appetite change, fatigue and unexpected weight change.  Eyes: Negative for pain and visual disturbance.  ENT neg for congestion or drip/ pos for sneezing one day/ neg for ST Respiratory: Negative for sob/ pos for wheeze/slt cough  Cardiovascular: Negative for cp or palpitations    Gastrointestinal: Negative for nausea, diarrhea and constipation.  Genitourinary: Negative for urgency and frequency.  Skin: Negative for pallor or rash   Neurological: Negative for weakness, light-headedness, numbness and headaches.  Hematological: Negative for adenopathy. Does not bruise/bleed easily.  Psychiatric/Behavioral: Negative for dysphoric mood. The patient is not nervous/anxious.         Objective:   Physical Exam  Constitutional: She appears well-developed and well-nourished. No distress.       Obese and well appearing  HENT:  Head: Normocephalic and atraumatic.  Right Ear:  External ear normal.  Left Ear: External ear normal.  Mouth/Throat: Oropharynx is clear and moist. No oropharyngeal exudate.       Nares are boggy  Eyes: Conjunctivae and EOM are normal. Pupils are equal, round, and reactive to light. Right eye exhibits no discharge. Left eye exhibits no discharge. No scleral icterus.  Neck: Normal range of motion. Neck supple. No JVD present. Carotid bruit is not  present. Erythema present. No thyromegaly present.  Cardiovascular: Normal rate, regular rhythm and normal heart sounds.  Exam reveals no gallop.   No murmur heard. Pulmonary/Chest: Effort normal. No respiratory distress. She has wheezes. She has no rales. She exhibits no tenderness.       Wheeze on forced exp only with mild prolonged exp phase  Sounds like this is coming from upper airways  Abdominal: Soft. Bowel sounds are normal. She exhibits no distension and no mass. There is no tenderness.  Musculoskeletal: She exhibits no edema and no tenderness.  Lymphadenopathy:    She has no cervical adenopathy.  Neurological: She is alert. She has normal reflexes. No cranial nerve deficit. She exhibits normal muscle tone. Coordination normal.  Skin: Skin is warm and dry. No rash noted. No erythema. No pallor.  Psychiatric: She has a normal mood and affect.          Assessment & Plan:

## 2011-12-26 NOTE — Assessment & Plan Note (Signed)
Prior smoker with wt gain and GERD Many poss etiol  cxr today  Consider pulm eval / PFTs Given albuterol MDI to try- inst in technique

## 2011-12-26 NOTE — Patient Instructions (Signed)
Chest xray today  Use inhaler as needed up to every 4 hours  Will update with result and plan

## 2011-12-26 NOTE — Assessment & Plan Note (Signed)
Still a non smoker - happy about that  cxr today for wheeze Disc poss of copd

## 2011-12-27 ENCOUNTER — Telehealth: Payer: Self-pay | Admitting: Family Medicine

## 2011-12-27 DIAGNOSIS — R062 Wheezing: Secondary | ICD-10-CM

## 2011-12-27 DIAGNOSIS — R0789 Other chest pain: Secondary | ICD-10-CM

## 2011-12-27 NOTE — Assessment & Plan Note (Signed)
S/p neg cardiac work up - appears to be related to some wheezing and mild cough On exam- sounds almost like upper airway problem cxr today Given bronchodilator MDI to try with inst  Will watch for triggers  Disc poss GERD Will likely need PFTs

## 2011-12-27 NOTE — Telephone Encounter (Signed)
Will do ref  

## 2011-12-27 NOTE — Telephone Encounter (Signed)
Message copied by Judy Pimple on Tue Dec 27, 2011  5:09 PM ------      Message from: Binnie Kand      Created: Tue Dec 27, 2011  9:23 AM       Patient notified as instructed by telephone. Was informed by patient that she would like the referral and Corinda Gubler is fine. Patient stated that the inhaler has helped. Advised patient that Shirlee Limerick will be in touch with her regarding the referral.

## 2012-01-06 ENCOUNTER — Institutional Professional Consult (permissible substitution): Payer: BC Managed Care – PPO | Admitting: Pulmonary Disease

## 2012-01-25 ENCOUNTER — Other Ambulatory Visit: Payer: Self-pay | Admitting: Cardiovascular Disease

## 2012-01-25 NOTE — Telephone Encounter (Signed)
Refilled hydrochlorothiazide.

## 2012-01-25 NOTE — Telephone Encounter (Signed)
Error

## 2012-03-28 ENCOUNTER — Institutional Professional Consult (permissible substitution): Payer: BC Managed Care – PPO | Admitting: Pulmonary Disease

## 2012-03-30 ENCOUNTER — Encounter: Payer: Self-pay | Admitting: Pulmonary Disease

## 2012-03-30 ENCOUNTER — Ambulatory Visit (INDEPENDENT_AMBULATORY_CARE_PROVIDER_SITE_OTHER): Payer: Managed Care, Other (non HMO) | Admitting: Pulmonary Disease

## 2012-03-30 VITALS — BP 142/84 | HR 73 | Temp 97.7°F | Ht 68.0 in | Wt 240.8 lb

## 2012-03-30 DIAGNOSIS — R0609 Other forms of dyspnea: Secondary | ICD-10-CM

## 2012-03-30 DIAGNOSIS — T17308A Unspecified foreign body in larynx causing other injury, initial encounter: Secondary | ICD-10-CM

## 2012-03-30 DIAGNOSIS — R0600 Dyspnea, unspecified: Secondary | ICD-10-CM

## 2012-03-30 DIAGNOSIS — E119 Type 2 diabetes mellitus without complications: Secondary | ICD-10-CM

## 2012-03-30 DIAGNOSIS — K219 Gastro-esophageal reflux disease without esophagitis: Secondary | ICD-10-CM

## 2012-03-30 DIAGNOSIS — IMO0002 Reserved for concepts with insufficient information to code with codable children: Secondary | ICD-10-CM

## 2012-03-30 DIAGNOSIS — R0989 Other specified symptoms and signs involving the circulatory and respiratory systems: Secondary | ICD-10-CM

## 2012-03-30 DIAGNOSIS — R0602 Shortness of breath: Secondary | ICD-10-CM

## 2012-03-30 DIAGNOSIS — E669 Obesity, unspecified: Secondary | ICD-10-CM

## 2012-03-30 NOTE — Patient Instructions (Signed)
We will give you a referral to the lifestyle center at [facility] for dietary consultation for weight loss and diabetes management. Try to lose 20 lbs before our next visit. Stay off cigarettes! We will send you for full pulmonary function testing at [facility]. We will see you back here in 2-3 months or sooner if needed

## 2012-03-30 NOTE — Progress Notes (Signed)
Subjective:    Patient ID: Sarah Chaney, female    DOB: 20-Apr-1961, 51 y.o.   MRN: [id number]  HPI  Sarah Chaney is a 51 year old female who comes to our clinic today for further evaluation of shortness of breath. She had a normal childhood without respiratory illnesses but she did have epilepsy. She did not have any respiratory problems as a young adult but this year she's noticed increasing shortness of breath with exertion. No last year she's gained over 20 pounds since her husband has suffered from some illnesses causing stress in the home. She quit smoking in April of 2013 because she said she "almost died" while participating in a Zumba class. Before that she smoked about one half pack of cigarettes daily for 30 years.  She states that in the last several months she's noticed increasing shortness of breath with minimal exertion, cough productive of clear sputum, wheezing, and chest tightness. She also notices that time showed a choking sensation typically when she's at rest. She feels pressure in and around her neck associated with mild shortness of breath. She does not have hoarseness with these episodes and they resolve spontaneously within seconds. These are not associated with mouth or tongue swelling.    Past Medical History  Diagnosis Date  . Anemia, unspecified   . Chondromalacia of patella   . Gastric polyps   . GERD (gastroesophageal reflux disease)   . Headache   . Unspecified essential hypertension   . Unspecified hypothyroidism   . Irritable bowel syndrome   . Persistent disorder of initiating or maintaining sleep   . Lateral epicondylitis  of elbow   . Variants of migraine, not elsewhere classified, without mention of intractable migraine without mention of status migrainosus   . Obesity, unspecified   . Palpitations   . Unspecified polyarthropathy or polyarthritis, multiple sites   . 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 Pure  hypercholesterolemia   . Restless legs syndrome (RLS)   . Disorders of bursae and tendons in shoulder region, unspecified   . Tachycardia, unspecified   . Personal history of tobacco use, presenting hazards to health   . Neuralgia, neuritis, and radiculitis, unspecified   . Diverticulosis   . Adenomatous colon polyp 1997     Family History  Problem Relation Age of Onset  . Diabetes type II Father   . Diabetes Mother   . Hyperlipidemia Mother   . Diabetes      neice     History   Social History  . Marital Status: Married    Spouse Name: N/A    Number of Children: 2  . Years of Education: N/A   Occupational History  . Medical Assistant (cardiovasc and vein)    Social History Main Topics  . Smoking status: Former Smoker -- 0.3 packs/day for 32 years    Types: Cigarettes    Quit date: 11/06/2011  . Smokeless tobacco: Never Used  . Alcohol Use: Yes     once a month  . Drug Use: No  . Sexually Active: Not on file   Other Topics Concern  . Not on file   Social History Narrative   Married with 2 daughtersGets regular exercise: 30 mins cardio and weightsDaily caffeine use: 4 per day, coffee and sodasHelps care for her grandchild     Allergies  Allergen Reactions  . Carbamazepine     REACTION: hives  . Diltiazem Hcl     REACTION: chills, fever  .  Metformin And Related Other (See Comments)    GI   . Nifedipine   . Verapamil     REACTION: edema     Outpatient Prescriptions Prior to Visit  Medication Sig Dispense Refill  . albuterol (PROVENTIL HFA;VENTOLIN HFA) 108 (90 BASE) MCG/ACT inhaler Inhale 2 puffs into the lungs every 4 (four) hours as needed for wheezing.  1 Inhaler  0  . Blood Glucose Monitoring Suppl (BLOOD GLUCOSE METER) kit Check glucose twice daily and as needed with rely on glucose meter and strips (please 100 strips) for dx of 250.0  100 each  11  . glipiZIDE (GLUCOTROL XL) 5 MG 24 hr tablet Take 1 tablet (5 mg total) by mouth daily.  30 tablet  11    . hydrochlorothiazide (HYDRODIURIL) 25 MG tablet Take 25 mg by mouth daily.       Marland Kitchen levothyroxine (SYNTHROID, LEVOTHROID) 150 MCG tablet TAKE ONE TABLET BY MOUTH EVERY DAY  30 tablet  6  . meloxicam (MOBIC) 15 MG tablet Take 15 mg by mouth daily.        . metoprolol (LOPRESSOR) 50 MG tablet Take 50 mg by mouth daily.       Marland Kitchen omeprazole (PRILOSEC) 40 MG capsule Take 1 capsule (40 mg total) by mouth 2 (two) times daily.  60 capsule  11  . zolpidem (AMBIEN) 10 MG tablet TAKE ONE TABLET BY MOUTH AT BEDTIME AS NEEDED FOR SLEEP  30 tablet  2  . hydrochlorothiazide (HYDRODIURIL) 25 MG tablet TAKE ONE TABLET BY MOUTH EVERY DAY AS NEEDED FOR  SWELLING  30 tablet  3  . pravastatin (PRAVACHOL) 20 MG tablet Take 0.5 tablets (10 mg total) by mouth every evening.  30 tablet  6      Review of Systems  Constitutional: Negative for fever, chills and unexpected weight change.  HENT: Positive for postnasal drip. Negative for ear pain, nosebleeds, congestion, sore throat, rhinorrhea, sneezing, trouble swallowing, dental problem, voice change and sinus pressure.   Eyes: Negative for visual disturbance.  Respiratory: Positive for chest tightness, shortness of breath and wheezing. Negative for cough and choking.   Cardiovascular: Negative for chest pain and leg swelling.  Gastrointestinal: Negative for vomiting, abdominal pain and diarrhea.  Genitourinary: Negative for difficulty urinating.  Musculoskeletal: Negative for arthralgias.  Skin: Negative for rash.  Neurological: Negative for tremors, syncope and headaches.  Hematological: Does not bruise/bleed easily.       Objective:   Physical Exam Filed Vitals:   03/30/12 1406  BP: 142/84  Pulse: 73  Temp: 97.7 F (36.5 C)  TempSrc: Oral  Height: 5\' 8"  (1.727 m)  Weight: 240 lb 12.8 oz (109.226 kg)  SpO2: 96%    Gen: obese female, well appearing, no acute distress HEENT: NCAT, PERRL, EOMi, OP clear, neck supple without masses, no thyroid mass,  normal tracheal/laryngeal anatomy by palpation, no lymphadenopathy PULM: CTA B CV: RRR, no mgr, no JVD AB: BS+, soft, nontender, no hsm Ext: warm, no edema, no clubbing, no cyanosis Derm: no rash or skin breakdown Neuro: A&Ox4, CN II-XII intact, strength 5/5 in all 4 extremities  03/30/2012 simple spirometry normal June 2013 chest x-ray normal      Assessment & Plan:   Shortness of breath I explained to Ms. Coe today in clinic that the differential diagnosis of shortness of breath is broad and includes lung disease, heart disease, anemia, neuromuscular weakness, deconditioning and metabolic abnormalities.   Is able review simple spirometry today in clinic as well  as a chest x-ray performed not that long ago and found no real abnormality with either. She walked 500 feet here in clinic and did fine with that and did not have a drop in oxygen saturation.  She's also had a recent stress test which was negative. Her hemoglobin this year was normal and she has no signs of bleeding or anemia on physical exam.  Based on this I think the likely explanation for her shortness of breath is her obesity and her lack of exercise. She tells me that she's gained it least 20 pounds in the last year. I also think that it is likely that there is a component of vocal cord dysfunction as she describes intermittent wheezing.  Asthma could certainly explain chest tightness, shortness of breath, and wheezing however it would be unusual for her to develop this at 51 years of age and unexpectedly some component of airway obstruction given the fact that she's taking any bronchodilators right now.  Plan: -We discussed many options for further evaluation including proceeding with a CPET; she elected to defer on this for now..  -We will obtain full pulmonary function testing to evaluate for restrictive disease -Have encouraged her to exercise regularly and try to lose 20-30 pounds before her next visit -If she's not  improved on the next visit I may consider sending her for methacholine challenge test -He would be ideal to perform a methacholine challenge test with a laryngoscopy to evaluate for vocal cord dysfunction as well.      GERD This can certainly contribute to vocal cord dysfunction, cough and a globus sensation. It sounds like she has fairly significant disease that is well controlled with Prevacid.  Plan: - reflux diet - Prevacid daily - weight loss  OBESITY She was referred to the lifestyle Center at [facility] for dietary education in the setting of diabetes and obesity. She was encouraged to exercise on a regular basis for weight loss.  Choking She describes the sensation of choking and stated that she was having an episode while seeing me in clinic. Notably she demonstrated no respiratory distress, she had no stridor, and she is completely comfortable during this episode. Objectively her neck exam was normal and her spirometry was normal.  Based on this I think is very unlikely that she is a fixed upper airway obstruction. I think it is possible that the sensation comes from untreated reflux or possibly vocal cord dysfunction.  Plan: -As above.    Updated Medication List Outpatient Encounter Prescriptions as of 03/30/2012  Medication Sig Dispense Refill  . albuterol (PROVENTIL HFA;VENTOLIN HFA) 108 (90 BASE) MCG/ACT inhaler Inhale 2 puffs into the lungs every 4 (four) hours as needed for wheezing.  1 Inhaler  0  . Blood Glucose Monitoring Suppl (BLOOD GLUCOSE METER) kit Check glucose twice daily and as needed with rely on glucose meter and strips (please 100 strips) for dx of 250.0  100 each  11  . glipiZIDE (GLUCOTROL XL) 5 MG 24 hr tablet Take 1 tablet (5 mg total) by mouth daily.  30 tablet  11  . hydrochlorothiazide (HYDRODIURIL) 25 MG tablet Take 25 mg by mouth daily.       Marland Kitchen levothyroxine (SYNTHROID, LEVOTHROID) 150 MCG tablet TAKE ONE TABLET BY MOUTH EVERY DAY  30 tablet  6  .  meloxicam (MOBIC) 15 MG tablet Take 15 mg by mouth daily.        . metoprolol (LOPRESSOR) 50 MG tablet Take 50 mg by mouth daily.       Marland Kitchen  omeprazole (PRILOSEC) 40 MG capsule Take 1 capsule (40 mg total) by mouth 2 (two) times daily.  60 capsule  11  . zolpidem (AMBIEN) 10 MG tablet TAKE ONE TABLET BY MOUTH AT BEDTIME AS NEEDED FOR SLEEP  30 tablet  2  . DISCONTD: hydrochlorothiazide (HYDRODIURIL) 25 MG tablet TAKE ONE TABLET BY MOUTH EVERY DAY AS NEEDED FOR  SWELLING  30 tablet  3  . DISCONTD: pravastatin (PRAVACHOL) 20 MG tablet Take 0.5 tablets (10 mg total) by mouth every evening.  30 tablet  6

## 2012-03-30 NOTE — Assessment & Plan Note (Signed)
She describes the sensation of choking and stated that she was having an episode while seeing me in clinic. Notably she demonstrated no respiratory distress, she had no stridor, and she is completely comfortable during this episode. Objectively her neck exam was normal and her spirometry was normal.  Based on this I think is very unlikely that she is a fixed upper airway obstruction. I think it is possible that the sensation comes from untreated reflux or possibly vocal cord dysfunction.  Plan: -As above.

## 2012-03-30 NOTE — Assessment & Plan Note (Signed)
I explained to Sarah Chaney today in clinic that the differential diagnosis of shortness of breath is broad and includes lung disease, heart disease, anemia, neuromuscular weakness, deconditioning and metabolic abnormalities.   Is able review simple spirometry today in clinic as well as a chest x-ray performed not that long ago and found no real abnormality with either. She walked 500 feet here in clinic and did fine with that and did not have a drop in oxygen saturation.  She's also had a recent stress test which was negative. Her hemoglobin this year was normal and she has no signs of bleeding or anemia on physical exam.  Based on this I think the likely explanation for her shortness of breath is her obesity and her lack of exercise. She tells me that she's gained it least 20 pounds in the last year. I also think that it is likely that there is a component of vocal cord dysfunction as she describes intermittent wheezing.  Asthma could certainly explain chest tightness, shortness of breath, and wheezing however it would be unusual for her to develop this at 51 years of age and unexpectedly some component of airway obstruction given the fact that she's taking any bronchodilators right now.  Plan: -We discussed many options for further evaluation including proceeding with a CPET; she elected to defer on this for now..  -We will obtain full pulmonary function testing to evaluate for restrictive disease -Have encouraged her to exercise regularly and try to lose 20-30 pounds before her next visit -If she's not improved on the next visit I may consider sending her for methacholine challenge test -He would be ideal to perform a methacholine challenge test with a laryngoscopy to evaluate for vocal cord dysfunction as well.

## 2012-03-30 NOTE — Assessment & Plan Note (Signed)
She was referred to the lifestyle Center at [facility] for dietary education in the setting of diabetes and obesity. She was encouraged to exercise on a regular basis for weight loss.

## 2012-03-30 NOTE — Assessment & Plan Note (Signed)
This can certainly contribute to vocal cord dysfunction, cough and a globus sensation. It sounds like she has fairly significant disease that is well controlled with Prevacid.  Plan: - reflux diet - Prevacid daily - weight loss

## 2012-04-18 ENCOUNTER — Other Ambulatory Visit: Payer: Self-pay | Admitting: Cardiovascular Disease

## 2012-04-18 NOTE — Telephone Encounter (Signed)
Refilled Metoprolol

## 2012-05-02 ENCOUNTER — Ambulatory Visit (INDEPENDENT_AMBULATORY_CARE_PROVIDER_SITE_OTHER): Payer: Managed Care, Other (non HMO) | Admitting: Family Medicine

## 2012-05-02 ENCOUNTER — Encounter: Payer: Self-pay | Admitting: Family Medicine

## 2012-05-02 VITALS — BP 142/96 | HR 82 | Temp 98.3°F | Ht 68.0 in | Wt 240.0 lb

## 2012-05-02 DIAGNOSIS — E119 Type 2 diabetes mellitus without complications: Secondary | ICD-10-CM

## 2012-05-02 DIAGNOSIS — I10 Essential (primary) hypertension: Secondary | ICD-10-CM

## 2012-05-02 DIAGNOSIS — T17308A Unspecified foreign body in larynx causing other injury, initial encounter: Secondary | ICD-10-CM

## 2012-05-02 DIAGNOSIS — IMO0002 Reserved for concepts with insufficient information to code with codable children: Secondary | ICD-10-CM

## 2012-05-02 DIAGNOSIS — E039 Hypothyroidism, unspecified: Secondary | ICD-10-CM

## 2012-05-02 DIAGNOSIS — Z1231 Encounter for screening mammogram for malignant neoplasm of breast: Secondary | ICD-10-CM

## 2012-05-02 DIAGNOSIS — Z23 Encounter for immunization: Secondary | ICD-10-CM

## 2012-05-02 LAB — TSH: TSH: 7.17 u[IU]/mL — ABNORMAL HIGH (ref 0.35–5.50)

## 2012-05-02 MED ORDER — LISINOPRIL 10 MG PO TABS: 10.0000 mg | ORAL_TABLET | Freq: Every day | ORAL | Status: DC

## 2012-05-02 NOTE — Patient Instructions (Addendum)
We will refer you for mammogram at check out Flu shot today Lab today We will also refer you to endocrine at check out  Start back on lisinopril  Follow up with me in 3-4 months

## 2012-05-02 NOTE — Progress Notes (Signed)
Subjective:    Patient ID: Sarah Chaney, female    DOB: [birth year], 51 y.o.   MRN: [id number]  HPI Here for DM and other issues  Needs mam scheduled- [facility]   Flu vaccine- wants that today  Diabetes Home sugar results - sugars usually run in 160s-170s , tend toward low sugars at times - hesitant to inc glucotrol  She gets hypoglycemia symptoms at 120  Intol metformin  DM diet - about 1/2 the time sticks with dm diet  Exercise - will join gyn soon  Symptoms-some numbness in her toes at times (did have plantar fasciectomy) A1C last  Lab Results  Component Value Date   HGBA1C 7.4* 06/29/2011    No problems with medications  Last eye exam -- going to get one soon- was a year ago   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 If goiter Thinks her gerd is well controlled with current ppi- omeprazole   Patient Active Problem List  Diagnosis  . HYPOTHYROIDISM  . PURE HYPERCHOLESTEROLEMIA  . OBESITY  . ANEMIA-NOS  . INSOMNIA, CHRONIC  . HYPERTENSION  . GERD  . IBS  . FATTY LIVER DISEASE  . POLYARTHRITIS  . CHONDROMALACIA PATELLA, LEFT  . PAIN IN JOINT, MULTIPLE SITES  . ROTATOR CUFF SYNDROME, LEFT  . LATERAL EPICONDYLITIS, LEFT  . UNSPECIFIED NEURALGIA NEURITIS AND RADICULITIS  . LEG PAIN, BILATERAL  . DIZZINESS  . HEADACHE  . TACHYCARDIA  . PALPITATIONS  . NAUSEA, CHRONIC  . DM2 (diabetes mellitus, type 2)  . POSITIVE PPD  . SEIZURES, HX OF  . COLONIC POLYPS, HX OF  . TOBACCO USE, QUIT  . PERIMENOPAUSAL STATUS  . RESTLESS LEG SYNDROME  . MIGRAINE VARIANT  . Other screening mammogram  . Thrush  . Routine general medical examination at a health care facility  . Nevus of upper arm  . Abdominal pain  . Chest tightness  . Shortness of breath  . Wheeze  . Choking   Past Medical History  Diagnosis Date  . Anemia,  unspecified   . Chondromalacia of patella   . Gastric polyps   . GERD (gastroesophageal reflux disease)   . Headache   . Unspecified essential hypertension   . Unspecified hypothyroidism   . Irritable bowel syndrome   . Persistent disorder of initiating or maintaining sleep   . Lateral epicondylitis  of elbow   . Variants of migraine, not elsewhere classified, without mention of intractable migraine without mention of status migrainosus   . Obesity, unspecified   . Palpitations   . Unspecified polyarthropathy or polyarthritis, multiple sites   . 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 Pure hypercholesterolemia   . Restless legs syndrome (RLS)   . Disorders of bursae and tendons in shoulder region, unspecified   . Tachycardia, unspecified   . Personal history of tobacco use, presenting hazards to health   . Neuralgia, neuritis, and radiculitis, unspecified   . Diverticulosis   . Adenomatous colon polyp 1997   Past Surgical History  Procedure Date  . Popliteal synovial cyst excision 12/2003  . Esophagogastroduodenoscopy 12/2001  . Colonoscopy 12/2001    Negative  . Polypectomy 12/2001  . Abd [place] 12/2002    Fatty liver  . Esophagogastroduodenoscopy 11/2007    nml  . Abd [place] 05/2010  Fatty liver, normal gall bladder and pancreas  . Exercise treadmill 5/13    normal   History  Substance Use Topics  . Smoking status: Former Smoker -- 0.3 packs/day for 32 years    Types: Cigarettes    Quit date: 11/06/2011  . Smokeless tobacco: Never Used  . Alcohol Use: Yes     once a month   Family History  Problem Relation Age of Onset  . Diabetes type II Father   . Diabetes Mother   . Hyperlipidemia Mother   . Diabetes      neice   Allergies  Allergen Reactions  . Carbamazepine     REACTION: hives  . Diltiazem Hcl     REACTION: chills, fever  . Metformin And Related Other (See Comments)    GI   . Nifedipine   . Verapamil     REACTION:  edema   Current Outpatient Prescriptions on File Prior to Visit  Medication Sig Dispense Refill  . albuterol (PROVENTIL HFA;VENTOLIN HFA) 108 (90 BASE) MCG/ACT inhaler Inhale 2 puffs into the lungs every 4 (four) hours as needed for wheezing.  1 Inhaler  0  . Blood Glucose Monitoring Suppl (BLOOD GLUCOSE METER) kit Check glucose twice daily and as needed with rely on glucose meter and strips (please 100 strips) for dx of 250.0  100 each  11  . glipiZIDE (GLUCOTROL XL) 5 MG 24 hr tablet Take 1 tablet (5 mg total) by mouth daily.  30 tablet  11  . hydrochlorothiazide (HYDRODIURIL) 25 MG tablet Take 25 mg by mouth daily.       Marland Kitchen levothyroxine (SYNTHROID, LEVOTHROID) 150 MCG tablet TAKE ONE TABLET BY MOUTH EVERY DAY  30 tablet  6  . meloxicam (MOBIC) 15 MG tablet Take 15 mg by mouth daily.       . metoprolol (LOPRESSOR) 50 MG tablet Take 50 mg by mouth 2 (two) times daily.       . metoprolol (LOPRESSOR) 50 MG tablet TAKE ONE-HALF TABLET BY MOUTH TWICE DAILY FOR ONE WEEK THEN INCREASE TO ONE TABLET TWICE DAILY  60 tablet  5  . omeprazole (PRILOSEC) 40 MG capsule Take 1 capsule (40 mg total) by mouth 2 (two) times daily.  60 capsule  11  . zolpidem (AMBIEN) 10 MG tablet TAKE ONE TABLET BY MOUTH AT BEDTIME AS NEEDED FOR SLEEP  30 tablet  2  . lisinopril (PRINIVIL) 10 MG tablet Take 1 tablet (10 mg total) by mouth daily.  30 tablet  11         Review of Systems Review of Systems  Constitutional: Negative for fever, appetite change,  and unexpected weight change. pos for fatigue  Eyes: Negative for pain and visual disturbance.  Respiratory: Negative for cough and pos for throat pressure / pressure feeling  Cardiovascular: Negative for cp or palpitations    Gastrointestinal: Negative for nausea, diarrhea and constipation.  Genitourinary: Negative for urgency and frequency.  Skin: Negative for pallor or rash   Neurological: Negative for weakness, light-headedness, and headaches.  Hematological:  Negative for adenopathy. Does not bruise/bleed easily.  Psychiatric/Behavioral: Negative for dysphoric mood. The patient is not nervous/anxious.  pos for severe stress       Objective:   Physical Exam  Constitutional: She appears well-developed and well-nourished. No distress.       obese and well appearing    HENT:  Head: Normocephalic and atraumatic.  Right Ear: External ear normal.  Left Ear: External ear normal.  Nose: Nose normal.  Mouth/Throat: Oropharynx is clear and moist.  Eyes: Conjunctivae normal and EOM are normal. Pupils are equal, round, and reactive to light. Right eye exhibits no discharge. Left eye exhibits no discharge. No scleral icterus.  Neck: Normal range of motion. Neck supple. No JVD present. Carotid bruit is not present. No tracheal deviation present. No thyromegaly present.  Cardiovascular: Normal rate, regular rhythm, normal heart sounds and intact distal pulses.  Exam reveals no gallop.   Pulmonary/Chest: Effort normal and breath sounds normal. No respiratory distress. She has no wheezes. She has no rales. She exhibits no tenderness.  Abdominal: Soft. Bowel sounds are normal. She exhibits no distension, no abdominal bruit and no mass. There is no tenderness.  Musculoskeletal: She exhibits no edema and no tenderness.  Lymphadenopathy:    She has no cervical adenopathy.  Neurological: She is alert. She has normal reflexes. She displays no tremor. No cranial nerve deficit. She exhibits normal muscle tone. Coordination normal.  Skin: Skin is warm and dry. No rash noted. No erythema. No pallor.  Psychiatric: She has a normal mood and affect.       Pleasant and talkative but in general pt sounds severely stressed           Assessment & Plan:

## 2012-05-03 ENCOUNTER — Telehealth: Payer: Self-pay

## 2012-05-03 NOTE — Telephone Encounter (Signed)
Pt request call back when lab results available. 

## 2012-05-03 NOTE — Assessment & Plan Note (Signed)
This was scheduled Enc self exam

## 2012-05-03 NOTE — Telephone Encounter (Signed)
Please see comment on her lab result

## 2012-05-03 NOTE — Assessment & Plan Note (Signed)
Pt has ongoing feeling of pressure in neck and throat  No overt goiter on exam today- but was ref to endo pulm w/u neg gerd is in the differential  Would consider ENT ref if no improvement

## 2012-05-03 NOTE — Assessment & Plan Note (Signed)
This is very difficult to control in that pt has symptoms of hypoglycemia even with sugars in the 120s a1c today Rev guidelines for DM diet  Wt loss desired also  Will ref to endo given challenges with symptoms/sugar control

## 2012-05-03 NOTE — Assessment & Plan Note (Signed)
With hx of hashimotos thyroiditis  tsh today Also ref to endocrine

## 2012-05-03 NOTE — Assessment & Plan Note (Signed)
Will start back on lisinopril  Disc lifestyle change for HTN incl need for wt loss and exercise Pt has had no time for self care lately

## 2012-05-14 ENCOUNTER — Emergency Department: Payer: Self-pay | Admitting: Emergency Medicine

## 2012-05-14 LAB — BASIC METABOLIC PANEL
Anion Gap: 7 (ref 7–16)
BUN: 18 mg/dL (ref 7–18)
Calcium, Total: 9.2 mg/dL (ref 8.5–10.1)
Creatinine: 0.72 mg/dL (ref 0.60–1.30)
EGFR (African American): >60
EGFR (Non-African Amer.): >60
Glucose: 212 mg/dL — ABNORMAL HIGH (ref 65–99)
Osmolality: 284 (ref 275–301)
Potassium: 4 mmol/L (ref 3.5–5.1)

## 2012-05-14 LAB — CBC
MCHC: 32.9 g/dL (ref 32.0–36.0)
MCV: 84 fL (ref 80–100)
Platelet: 200 10*3/uL (ref 150–440)
RDW: 14.8 % — ABNORMAL HIGH (ref 11.5–14.5)

## 2012-05-14 LAB — URINALYSIS, COMPLETE
Bilirubin,UR: NEGATIVE
Blood: NEGATIVE
Ketone: NEGATIVE
Ph: 6 (ref 4.5–8.0)
RBC,UR: 2 /HPF (ref 0–5)
Squamous Epithelial: 6
WBC UR: 19 /HPF (ref 0–5)

## 2012-05-15 LAB — URINE CULTURE

## 2012-05-22 ENCOUNTER — Telehealth: Payer: Self-pay | Admitting: *Deleted

## 2012-05-22 NOTE — Telephone Encounter (Signed)
I would be re assured if she could follow up with me  When able to re check urine and examine her  Thanks

## 2012-05-22 NOTE — Telephone Encounter (Signed)
Called pt per your request to see what [facility] prescribed her and how are her sxs, pt said that they gave her 2 weeks of Cipro to take BID, and also when she was at the hospital they gave her Rocephin in her IV, pt said she is doing much better and her sxs are gone but wanted to know if she should follow up with [place] to do another U/A to make sure infection is gone, please advise  *also put [facility]'s notes back in your IN Box (wasn't sure if you were done with them)*

## 2012-05-23 ENCOUNTER — Ambulatory Visit: Payer: Self-pay | Admitting: Family Medicine

## 2012-05-23 NOTE — Telephone Encounter (Signed)
Left voicemail letting pt know Dr. Karie Schwalbe does want her to schedule a f/u appt.

## 2012-05-24 NOTE — Telephone Encounter (Signed)
Pt scheduled fu appt.

## 2012-05-28 ENCOUNTER — Encounter: Payer: Self-pay | Admitting: Family Medicine

## 2012-05-28 ENCOUNTER — Other Ambulatory Visit: Payer: Self-pay | Admitting: Cardiovascular Disease

## 2012-05-28 NOTE — Telephone Encounter (Signed)
Refilled Hydrochlorothiazide. 

## 2012-05-29 ENCOUNTER — Encounter: Payer: Self-pay | Admitting: *Deleted

## 2012-06-04 ENCOUNTER — Other Ambulatory Visit: Payer: Self-pay | Admitting: Family Medicine

## 2012-06-05 ENCOUNTER — Other Ambulatory Visit: Payer: Self-pay | Admitting: Family Medicine

## 2012-06-06 ENCOUNTER — Other Ambulatory Visit: Payer: Self-pay

## 2012-06-06 ENCOUNTER — Ambulatory Visit: Payer: Managed Care, Other (non HMO) | Admitting: Family Medicine

## 2012-06-06 NOTE — Telephone Encounter (Signed)
Pt left v/m; pt went to pick up omeprazole 40 mg at [address]. Ins co denied; [facility] to send PA form; pt said GI dr originally gave med. 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

## 2012-06-06 NOTE — Telephone Encounter (Signed)
Prior auth needed for Omeprazole 40 mg due to quantity; spoke with Mathis Fare [phone] approval over phone for 12 months; case # [id number]; [facility] [address], [place] notified. Patient notified as instructed by telephone v/m.

## 2012-06-20 ENCOUNTER — Encounter: Payer: Self-pay | Admitting: Family Medicine

## 2012-06-20 ENCOUNTER — Ambulatory Visit: Payer: Managed Care, Other (non HMO) | Admitting: Family Medicine

## 2012-06-20 ENCOUNTER — Ambulatory Visit (INDEPENDENT_AMBULATORY_CARE_PROVIDER_SITE_OTHER): Payer: Managed Care, Other (non HMO) | Admitting: Family Medicine

## 2012-06-20 VITALS — BP 124/76 | HR 79 | Temp 98.2°F | Ht 68.0 in | Wt 237.5 lb

## 2012-06-20 DIAGNOSIS — Z87440 Personal history of urinary (tract) infections: Secondary | ICD-10-CM

## 2012-06-20 DIAGNOSIS — Z87448 Personal history of other diseases of urinary system: Secondary | ICD-10-CM

## 2012-06-20 DIAGNOSIS — G43109 Migraine with aura, not intractable, without status migrainosus: Secondary | ICD-10-CM

## 2012-06-20 DIAGNOSIS — K219 Gastro-esophageal reflux disease without esophagitis: Secondary | ICD-10-CM

## 2012-06-20 LAB — POCT URINALYSIS DIPSTICK
Ketones, UA: NEGATIVE
Leukocytes, UA: NEGATIVE
Nitrite, UA: NEGATIVE
Protein, UA: NEGATIVE
Urobilinogen, UA: 0.2
pH, UA: 6

## 2012-06-20 MED ORDER — DEXLANSOPRAZOLE 60 MG PO CPDR: 60.0000 mg | DELAYED_RELEASE_CAPSULE | Freq: Every day | ORAL | Status: DC

## 2012-06-20 MED ORDER — CLONAZEPAM 0.5 MG PO TABS: ORAL_TABLET | ORAL | Status: DC

## 2012-06-20 NOTE — Progress Notes (Signed)
Subjective:    Patient ID: Sarah Chaney, female    DOB: [birth year], 51 y.o.   MRN: [id number]  HPI Here for f/u of ER in end of October Went to the ER for what she thought was a kidney stone -- a lot of pain in R flank  cipro and rocephin  Got better fairly quickly - feels much better now  ua is clear today   Is having headaches and nausea now   Used to get headaches very randomly - with aura and then migraine  Now they are getting much more frequent  One day had one all day   Now - getting headache 4-5 times a month with aura  Other headaches that are more like tension headaches   Is 4 years into menopause  Still hot all the time  Cut [place] back on caffeine - 1/2 cup coffee in am and one soda / mostly water with lemon in it  Still not smoking !!!- proud of herself  ambien daily  Also takes omeprazole  Stress level is high but not as bad as it was (husband's health is better)  Sugars have been fairly stable  She does not eat properly, however   Just started herbalife product today Hope it will make her eat differently  Not exercising yet  Will be opening a gym in [place] soon   Is eating healthy when she does eat    Headache - black dot in vision- one eye (either) -- that lasts for 1/2 hour Then aura for 1/2 hour - wavy lines on periph vision - in "U" shape- same side - same side as the black dot  Headache occurs on opposite side as the aura - throb/ pressure / light sensitivity - as severe as a 10/10 pain scale Is on meloxicam - so no longer takes extra nsaids Tylenol does not tend to help   Nausea is not associated with headaches  Is just nauseated all the time  Worse in the past week  No abd pain  No heartburn  Does have pain in the groin area   Ct scan showed hernia - on the R side  Can feel a pull when she gets up - but no bulge or persistent pain   Patient Active Problem List  Diagnosis  . HYPOTHYROIDISM  . PURE HYPERCHOLESTEROLEMIA  . OBESITY  .  ANEMIA-NOS  . INSOMNIA, CHRONIC  . HYPERTENSION  . GERD  . IBS  . FATTY LIVER DISEASE  . POLYARTHRITIS  . CHONDROMALACIA PATELLA, LEFT  . PAIN IN JOINT, MULTIPLE SITES  . ROTATOR CUFF SYNDROME, LEFT  . LATERAL EPICONDYLITIS, LEFT  . UNSPECIFIED NEURALGIA NEURITIS AND RADICULITIS  . LEG PAIN, BILATERAL  . DIZZINESS  . HEADACHE  . TACHYCARDIA  . PALPITATIONS  . NAUSEA, CHRONIC  . DM2 (diabetes mellitus, type 2)  . POSITIVE PPD  . SEIZURES, HX OF  . COLONIC POLYPS, HX OF  . TOBACCO USE, QUIT  . PERIMENOPAUSAL STATUS  . RESTLESS LEG SYNDROME  . Migraine with aura  . Other screening mammogram  . Thrush  . Routine general medical examination at a health care facility  . Nevus of upper arm  . Abdominal pain  . Chest tightness  . Shortness of breath  . Wheeze  . Choking   Past Medical History  Diagnosis Date  . Anemia, unspecified   . Chondromalacia of patella   . Gastric polyps   . GERD (gastroesophageal reflux disease)   .  Headache   . Unspecified essential hypertension   . Unspecified hypothyroidism   . Irritable bowel syndrome   . Persistent disorder of initiating or maintaining sleep   . Lateral epicondylitis  of elbow   . Variants of migraine, not elsewhere classified, without mention of intractable migraine without mention of status migrainosus   . Obesity, unspecified   . Palpitations   . Unspecified polyarthropathy or polyarthritis, multiple sites   . 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 Pure hypercholesterolemia   . Restless legs syndrome (RLS)   . Disorders of bursae and tendons in shoulder region, unspecified   . Tachycardia, unspecified   . Personal history of tobacco use, presenting hazards to health   . Neuralgia, neuritis, and radiculitis, unspecified   . Diverticulosis   . Adenomatous colon polyp 1997   Past Surgical History  Procedure Date  . Popliteal synovial cyst excision 12/2003  .  Esophagogastroduodenoscopy 12/2001  . Colonoscopy 12/2001    Negative  . Polypectomy 12/2001  . Abd [place] 12/2002    Fatty liver  . Esophagogastroduodenoscopy 11/2007    nml  . Abd [place] 05/2010    Fatty liver, normal gall bladder and pancreas  . Exercise treadmill 5/13    normal   History  Substance Use Topics  . Smoking status: Former Smoker -- 0.3 packs/day for 32 years    Types: Cigarettes    Quit date: 11/08/2011  . Smokeless tobacco: Never Used  . Alcohol Use: Yes     Comment: once a month   Family History  Problem Relation Age of Onset  . Diabetes type II Father   . Diabetes Mother   . Hyperlipidemia Mother   . Diabetes      neice   Allergies  Allergen Reactions  . Carbamazepine     REACTION: hives  . Diltiazem Hcl     REACTION: chills, fever  . Metformin And Related Other (See Comments)    GI   . Nifedipine   . Verapamil     REACTION: edema   Current Outpatient Prescriptions on File Prior to Visit  Medication Sig Dispense Refill  . albuterol (PROVENTIL HFA;VENTOLIN HFA) 108 (90 BASE) MCG/ACT inhaler Inhale 2 puffs into the lungs every 4 (four) hours as needed for wheezing.  1 Inhaler  0  . glipiZIDE (GLUCOTROL XL) 5 MG 24 hr tablet TAKE ONE TABLET BY MOUTH EVERY DAY  30 tablet  1  . hydrochlorothiazide (HYDRODIURIL) 25 MG tablet Take 25 mg by mouth daily.       Marland Kitchen levothyroxine (SYNTHROID, LEVOTHROID) 150 MCG tablet TAKE ONE TABLET BY MOUTH EVERY DAY  30 tablet  6  . lisinopril (PRINIVIL) 10 MG tablet Take 1 tablet (10 mg total) by mouth daily.  30 tablet  11  . meloxicam (MOBIC) 15 MG tablet Take 15 mg by mouth daily.       . metoprolol (LOPRESSOR) 50 MG tablet Take 50 mg by mouth 2 (two) times daily.       . saxagliptin HCl (ONGLYZA) 5 MG TABS tablet Take 5 mg by mouth daily.      . [DISCONTINUED] metoprolol (LOPRESSOR) 50 MG tablet TAKE ONE-HALF TABLET BY MOUTH TWICE DAILY FOR ONE WEEK THEN INCREASE TO ONE TABLET TWICE DAILY  60 tablet  5  . clonazePAM  (KLONOPIN) 0.5 MG tablet Take 1-2 pills by mouth at bedtime for sleep as needed  60 tablet  0  . dexlansoprazole (DEXILANT) 60 MG  capsule Take 1 capsule (60 mg total) by mouth daily.  30 capsule  11           Review of Systems Review of Systems  Constitutional: Negative for fever, appetite change, and unexpected weight change. pos for fatigue from her schedule Eyes: Negative for pain and visual disturbance. pos for vision aura before migraine Respiratory: Negative for cough and shortness of breath.   Cardiovascular: Negative for cp or palpitations    Gastrointestinal: Negative for nausea, diarrhea and constipation.  Genitourinary: Negative for urgency and frequency.  Skin: Negative for pallor or rash   Neurological: Negative for weakness, light-headedness, numbness and pos for headaches Hematological: Negative for adenopathy. Does not bruise/bleed easily.  Psychiatric/Behavioral: Negative for dysphoric mood. The patient is not nervous/anxious.  pos for stressors       Objective:   Physical Exam  Constitutional: She appears well-developed and well-nourished. No distress.       obese and well appearing   HENT:  Head: Normocephalic and atraumatic.  Right Ear: External ear normal.  Left Ear: External ear normal.  Nose: Nose normal.  Mouth/Throat: Oropharynx is clear and moist. No oropharyngeal exudate.       No sinus tenderness  Eyes: Conjunctivae normal and EOM are normal. Pupils are equal, round, and reactive to light. Right eye exhibits no discharge. Left eye exhibits no discharge. No scleral icterus.  Neck: Normal range of motion. Neck supple. No JVD present. Carotid bruit is not present. No thyromegaly present.  Cardiovascular: Normal rate, regular rhythm, normal heart sounds and intact distal pulses.  Exam reveals no gallop.   Pulmonary/Chest: Effort normal and breath sounds normal. No respiratory distress. She has no wheezes.  Abdominal: Soft. Bowel sounds are normal. She  exhibits no distension, no abdominal bruit and no mass. There is no tenderness.  Musculoskeletal: She exhibits no edema and no tenderness.  Lymphadenopathy:    She has no cervical adenopathy.  Neurological: She is alert. She has normal reflexes. She displays no atrophy and no tremor. No cranial nerve deficit or sensory deficit. She exhibits normal muscle tone. Coordination and gait normal.       No focal cerebellar signs   Skin: Skin is warm and dry. No rash noted. No erythema. No pallor.  Psychiatric: She has a normal mood and affect.          Assessment & Plan:

## 2012-06-20 NOTE — Patient Instructions (Addendum)
Avoid caffeine Stop wearing scents  Drink lots of water Stop omeprazole and start dexilant once daily  Stop the ambien and start klonopin 1-2 pills as needed at bedtime  We will see if this helps headaches and nausea Follow up with me in 2-3 weeks

## 2012-06-24 DIAGNOSIS — Z87448 Personal history of other diseases of urinary system: Secondary | ICD-10-CM | POA: Insufficient documentation

## 2012-06-24 NOTE — Assessment & Plan Note (Addendum)
Disc inc frequency and duration of migraines- this may in fact be hormonal due to age  Will try to eliminate all triggers Since omeprazole has side eff or headache- will change that to dexilant Since Remus Loffler also has side eff or headache - will change to klonopin  Disc imp of regular sleep habits/ elimination of caffeine and start of an exercise program  Also counseling if she needs it for stressors If no improvement with these measures we will disc opt for proph med or ref for neurol >25 min spent with face to face with patient, >50% counseling and/or coordinating care

## 2012-06-24 NOTE — Assessment & Plan Note (Signed)
Will change her omeprazole to dexilant - to see if this minimizes her headaches  Also wt loss and better diet

## 2012-06-24 NOTE — Assessment & Plan Note (Signed)
Reviewed hospital records and studies that I have from [facility] , however they are not complete at this time  Symptom free and with neg ua today- reassuring  Disc ways to avoid uti in detail and rev symptoms to watch for  Disc inc water intake

## 2012-06-28 ENCOUNTER — Other Ambulatory Visit: Payer: Self-pay | Admitting: Family Medicine

## 2012-06-28 ENCOUNTER — Telehealth: Payer: Self-pay | Admitting: Family Medicine

## 2012-06-28 LAB — URINALYSIS, COMPLETE
Bacteria: NONE SEEN
Bilirubin,UR: NEGATIVE
Blood: NEGATIVE
Glucose,UR: NEGATIVE mg/dL (ref 0–75)
Ketone: NEGATIVE
Leukocyte Esterase: NEGATIVE
Nitrite: NEGATIVE
Ph: 6 (ref 4.5–8.0)
Specific Gravity: 1.025 (ref 1.003–1.030)
Squamous Epithelial: <1
WBC UR: 1 /HPF (ref 0–5)

## 2012-06-28 NOTE — Telephone Encounter (Signed)
Please have her get the ua and send me the result -make sure they can do a culture as well

## 2012-06-28 NOTE — Telephone Encounter (Signed)
Thanks, I will be on the look out for those

## 2012-06-28 NOTE — Telephone Encounter (Signed)
See if Tower is going to be available today to handle this.  I would like to know her preferences if possible.  Thanks.  If she is going to be unavailable, then let me know.

## 2012-06-28 NOTE — Telephone Encounter (Signed)
Patient Information:  Caller Name: Alexandrya  Phone: [phone]  Patient: Sarah Chaney, Sarah Chaney  Gender: Female  DOB: Dec 19, 1960  Age: 51 Years  PCP: Tower, [employer])  Pregnant: No   Symptoms  Reason For Call & Symptoms: flank pain  Reviewed Health History In EMR: Yes  Reviewed Medications In EMR: Yes  Reviewed Allergies In EMR: Yes  Reviewed Surgeries / Procedures: Yes  Date of Onset of Symptoms: 06/27/2012  Treatments Tried: Ibuprofen  only mild relief  Treatments Tried Worked: No OB:  LMP: Unknown  Guideline(s) Used:  Flank Pain  Disposition Per Guideline:   See Today in Office  Reason For Disposition Reached:   Diabetes mellitus or weak immune system (e.g., HIV positive, cancer chemo, splenectomy, organ transplant, chronic steroids) (EXCEPTION: mild pain that is only present with movement)  Advice Given:  N/A  Office Follow Up:  Does the office need to follow up with this patient?: Yes  Instructions For The Office: She works at a MD office and she said if possible she would like to go next door to [facility] during her lunch break and have them run a urine then have Dr Milinda Antis look at results and order antibiotic if needed.  alternate number is work  [phone]   ext [phone]  RN Overrode Recommendation:  Document Patient  She is at work  Charity fundraiser Note:  Hx of kidney infection in past, starting with similar sxs   Flank pain radiates around to bladder area and causing pressure feeling.  No fever

## 2012-06-28 NOTE — Telephone Encounter (Signed)
Patient notified as instructed by telephone. Pt said Dr Gilda Crease ordered U/A & C&S at [facility] while pt was at lunch today. Requested results to Dr Milinda Antis.

## 2012-06-29 LAB — URINE CULTURE

## 2012-07-02 NOTE — Telephone Encounter (Signed)
Pt notified that her UA and culture were both neg. Pt said she is feeling better and will just keep her f/u appt with [place] on 07/11/12

## 2012-07-11 ENCOUNTER — Encounter: Payer: Self-pay | Admitting: Family Medicine

## 2012-07-11 ENCOUNTER — Ambulatory Visit: Payer: Managed Care, Other (non HMO) | Admitting: Family Medicine

## 2012-07-11 DIAGNOSIS — Z0289 Encounter for other administrative examinations: Secondary | ICD-10-CM

## 2012-07-23 ENCOUNTER — Telehealth: Payer: Self-pay | Admitting: Family Medicine

## 2012-07-23 MED ORDER — ZOLPIDEM TARTRATE 10 MG PO TABS: 10.0000 mg | ORAL_TABLET | Freq: Every evening | ORAL | Status: DC | PRN

## 2012-07-23 NOTE — Telephone Encounter (Signed)
Rx called in as prescribed Left voicemail letting pt know Rx was sent to pharm

## 2012-07-23 NOTE — Telephone Encounter (Signed)
That is fine  Px written for call in   

## 2012-07-23 NOTE — Telephone Encounter (Signed)
Pt states that she was taking ambien previously but it was causing HA so Dr Milinda Antis switched her to clonazepam.  She states that after being on clonazepam x 3 weeks, she is having a lot of trouble sleeping and continues to have the HA.  She is requesting to be put back on ambien since it seems the HA are not coming from that medication and the clonazepam isn't working.  Please advise.

## 2012-08-01 ENCOUNTER — Ambulatory Visit (INDEPENDENT_AMBULATORY_CARE_PROVIDER_SITE_OTHER): Payer: Managed Care, Other (non HMO) | Admitting: Family Medicine

## 2012-08-01 ENCOUNTER — Encounter: Payer: Self-pay | Admitting: Family Medicine

## 2012-08-01 VITALS — BP 110/72 | HR 75 | Temp 98.3°F | Ht 68.0 in | Wt 235.8 lb

## 2012-08-01 DIAGNOSIS — G43109 Migraine with aura, not intractable, without status migrainosus: Secondary | ICD-10-CM

## 2012-08-01 DIAGNOSIS — E119 Type 2 diabetes mellitus without complications: Secondary | ICD-10-CM

## 2012-08-01 DIAGNOSIS — I10 Essential (primary) hypertension: Secondary | ICD-10-CM

## 2012-08-01 MED ORDER — TOPIRAMATE 25 MG PO TABS: 25.0000 mg | ORAL_TABLET | Freq: Two times a day (BID) | ORAL | Status: DC

## 2012-08-01 NOTE — Assessment & Plan Note (Signed)
Making great lifestyle changes, on onglyza and seeing endocrinology

## 2012-08-01 NOTE — Assessment & Plan Note (Signed)
No imp with lifestyle change or PPI switch  Will try prophylaxis with low dose topamax  Disc poss side eff in great detail and given handout on the medicine Urged to keep up the good lifestyle effort F/u in about 2 mo

## 2012-08-01 NOTE — Assessment & Plan Note (Signed)
Good control with lisinopril (and also renal protection) bp in fair control at this time  No changes needed  Disc lifstyle change with low sodium diet and exercise

## 2012-08-01 NOTE — Patient Instructions (Addendum)
For headache prevention start topamax 25 mg one daily in the evening for 1 week and then if no problems increase to 1 pill twice daily  If any depression or other significant side effects let me know Keep up the good lifestyle change Follow up in about 2 months

## 2012-08-01 NOTE — Progress Notes (Signed)
Subjective:    Patient ID: Sarah Chaney, female    DOB: Jul 08, 1961, 52 y.o.   MRN: [id number]  HPI Here for f/u of chronic problems  Wt is down 2 lb with bmi of 35 She is disapointed in small amt of wt loss - is going to the gym and eating very well and very lean  Drinking lots of water  She has not missed a gym day   Headache Changed to dexilant last time (that works very well) Change from [place] did not help-went back to it  Headaches are just a little bit better  Not sure if she is ready to commit to headache prophylaxis   She had seizures as a child -none since age 52 -no meds since then     tsh was high this time  Did inc her dose Ref to endo- she went for her visit -it went ok  Added the onglyza 5 mg  DM Lab Results  Component Value Date   HGBA1C 7.8* 05/02/2012    On lisinopril bp is stable today  No cp or palpitations or headaches or edema  No side effects to medicines  BP Readings from Last 3 Encounters:  08/01/12 110/72  06/20/12 124/76  05/02/12 142/96        Patient Active Problem List  Diagnosis  . HYPOTHYROIDISM  . PURE HYPERCHOLESTEROLEMIA  . OBESITY  . ANEMIA-NOS  . INSOMNIA, CHRONIC  . HYPERTENSION  . GERD  . IBS  . FATTY LIVER DISEASE  . POLYARTHRITIS  . CHONDROMALACIA PATELLA, LEFT  . PAIN IN JOINT, MULTIPLE SITES  . ROTATOR CUFF SYNDROME, LEFT  . LATERAL EPICONDYLITIS, LEFT  . UNSPECIFIED NEURALGIA NEURITIS AND RADICULITIS  . LEG PAIN, BILATERAL  . DIZZINESS  . HEADACHE  . TACHYCARDIA  . PALPITATIONS  . NAUSEA, CHRONIC  . DM2 (diabetes mellitus, type 2)  . POSITIVE PPD  . SEIZURES, HX OF  . COLONIC POLYPS, HX OF  . TOBACCO USE, QUIT  . PERIMENOPAUSAL STATUS  . RESTLESS LEG SYNDROME  . Migraine with aura  . Other screening mammogram  . Thrush  . Routine general medical examination at a health care facility  . Nevus of upper arm  . History of pyelonephritis   Past Medical History  Diagnosis Date  . Anemia, unspecified    . Chondromalacia of patella   . Gastric polyps   . GERD (gastroesophageal reflux disease)   . Headache   . Unspecified essential hypertension   . Unspecified hypothyroidism   . Irritable bowel syndrome   . Persistent disorder of initiating or maintaining sleep   . Lateral epicondylitis  of elbow   . Variants of migraine, not elsewhere classified, without mention of intractable migraine without mention of status migrainosus   . Obesity, unspecified   . Palpitations   . Unspecified polyarthropathy or polyarthritis, multiple sites   . 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 Pure hypercholesterolemia   . Restless legs syndrome (RLS)   . Disorders of bursae and tendons in shoulder region, unspecified   . Tachycardia, unspecified   . Personal history of tobacco use, presenting hazards to health   . Neuralgia, neuritis, and radiculitis, unspecified   . Diverticulosis   . Adenomatous colon polyp 1997   Past Surgical History  Procedure Date  . Popliteal synovial cyst excision 12/2003  . Esophagogastroduodenoscopy 12/2001  . Colonoscopy 12/2001    Negative  . Polypectomy 12/2001  . Abd [place] 12/2002  Fatty liver  . Esophagogastroduodenoscopy 11/2007    nml  . Abd [place] 05/2010    Fatty liver, normal gall bladder and pancreas  . Exercise treadmill 5/13    normal   History  Substance Use Topics  . Smoking status: Former Smoker -- 0.3 packs/day for 32 years    Types: Cigarettes    Quit date: 11/08/2011  . Smokeless tobacco: Never Used  . Alcohol Use: Yes     Comment: once a month   Family History  Problem Relation Age of Onset  . Diabetes type II Father   . Diabetes Mother   . Hyperlipidemia Mother   . Diabetes      neice   Allergies  Allergen Reactions  . Carbamazepine     REACTION: hives  . Diltiazem Hcl     REACTION: chills, fever  . Metformin And Related Other (See Comments)    GI   . Nifedipine   . Verapamil     REACTION:  edema   Current Outpatient Prescriptions on File Prior to Visit  Medication Sig Dispense Refill  . albuterol (PROVENTIL HFA;VENTOLIN HFA) 108 (90 BASE) MCG/ACT inhaler Inhale 2 puffs into the lungs every 4 (four) hours as needed for wheezing.  1 Inhaler  0  . aspirin 81 MG tablet Take 162 mg by mouth daily.       Marland Kitchen dexlansoprazole (DEXILANT) 60 MG capsule Take 1 capsule (60 mg total) by mouth daily.  30 capsule  11  . glipiZIDE (GLUCOTROL XL) 5 MG 24 hr tablet TAKE ONE TABLET BY MOUTH EVERY DAY  30 tablet  1  . hydrochlorothiazide (HYDRODIURIL) 25 MG tablet Take 25 mg by mouth daily.       Marland Kitchen levothyroxine (SYNTHROID, LEVOTHROID) 150 MCG tablet TAKE ONE TABLET BY MOUTH EVERY DAY  30 tablet  6  . lisinopril (PRINIVIL) 10 MG tablet Take 1 tablet (10 mg total) by mouth daily.  30 tablet  11  . meloxicam (MOBIC) 15 MG tablet Take 15 mg by mouth daily.       . metoprolol (LOPRESSOR) 50 MG tablet Take 50 mg by mouth 2 (two) times daily.       . saxagliptin HCl (ONGLYZA) 5 MG TABS tablet Take 5 mg by mouth daily.      Marland Kitchen zolpidem (AMBIEN) 10 MG tablet Take 1 tablet (10 mg total) by mouth at bedtime as needed for sleep.  30 tablet  3     Review of Systems Review of Systems  Constitutional: Negative for fever, appetite change,  and unexpected weight change. pos for fatigue from a busy schedule  Eyes: Negative for pain and visual disturbance.  Respiratory: Negative for cough and shortness of breath.   Cardiovascular: Negative for cp or palpitations   neg for edema  Gastrointestinal: Negative for nausea, diarrhea and constipation.  Genitourinary: Negative for urgency and frequency.  Skin: Negative for pallor or rash   Neurological: Negative for weakness, light-headedness, numbness and pos for frequent headaches and insomnia  Hematological: Negative for adenopathy. Does not bruise/bleed easily.  Psychiatric/Behavioral: Negative for dysphoric mood. The patient is not nervous/anxious.           Objective:   Physical Exam  Constitutional: She appears well-developed and well-nourished.       obese and well appearing   HENT:  Head: Normocephalic and atraumatic.  Mouth/Throat: Oropharynx is clear and moist.       No sinus or temporal tenderness  Eyes: Conjunctivae normal and EOM  are normal. Pupils are equal, round, and reactive to light. Right eye exhibits no discharge. Left eye exhibits no discharge. No scleral icterus.  Neck: Normal range of motion. Neck supple. No JVD present. Carotid bruit is not present. No thyromegaly present.  Cardiovascular: Normal rate, regular rhythm, normal heart sounds and intact distal pulses.  Exam reveals no gallop.   Pulmonary/Chest: Effort normal and breath sounds normal. No respiratory distress. She has no wheezes.  Abdominal: She exhibits no abdominal bruit.  Musculoskeletal: She exhibits no edema and no tenderness.  Lymphadenopathy:    She has no cervical adenopathy.  Neurological: She is alert. She has normal reflexes. She displays no atrophy and no tremor. No cranial nerve deficit or sensory deficit. She exhibits normal muscle tone. Coordination and gait normal.       No focal cerebellar signs   Skin: Skin is warm and dry. No rash noted. No erythema. No pallor.  Psychiatric: She has a normal mood and affect.          Assessment & Plan:

## 2012-08-04 ENCOUNTER — Emergency Department: Payer: Self-pay | Admitting: Emergency Medicine

## 2012-08-04 LAB — URINALYSIS, COMPLETE
Blood: NEGATIVE
Glucose,UR: NEGATIVE mg/dL (ref 0–75)
Ph: 7 (ref 4.5–8.0)
RBC,UR: <1 /HPF (ref 0–5)
Specific Gravity: 1.02 (ref 1.003–1.030)
Squamous Epithelial: 1
WBC UR: 3 /HPF (ref 0–5)

## 2012-08-04 LAB — CBC
HCT: 46.7 % (ref 35.0–47.0)
MCHC: 31.7 g/dL — ABNORMAL LOW (ref 32.0–36.0)
RBC: 5.48 10*6/uL — ABNORMAL HIGH (ref 3.80–5.20)
RDW: 14.1 % (ref 11.5–14.5)
WBC: 10.1 10*3/uL (ref 3.6–11.0)

## 2012-08-04 LAB — BASIC METABOLIC PANEL
Anion Gap: 6 — ABNORMAL LOW (ref 7–16)
BUN: 17 mg/dL (ref 7–18)
Co2: 28 mmol/L (ref 21–32)
Creatinine: 0.9 mg/dL (ref 0.60–1.30)
EGFR (African American): >60
EGFR (Non-African Amer.): >60
Osmolality: 280 (ref 275–301)
Potassium: 4 mmol/L (ref 3.5–5.1)
Sodium: 138 mmol/L (ref 136–145)

## 2012-08-29 ENCOUNTER — Telehealth: Payer: Self-pay

## 2012-08-29 MED ORDER — TOPIRAMATE 50 MG PO TABS: ORAL_TABLET | ORAL | Status: DC

## 2012-08-29 NOTE — Telephone Encounter (Signed)
Pt notified of Dr. Royden Purl comments and new Rx called in as prescribed

## 2012-08-29 NOTE — Telephone Encounter (Signed)
Yes- we can go up to that but if not helpful after several weeks, please let me know  I changed the px - please call in to her pharmacy

## 2012-08-29 NOTE — Telephone Encounter (Signed)
Pt left v/m requesting increase in Topamax; pt presently taking Topamax 50 mg twice a day and still having h/a's in afternoon; pt wants to know if can take Topamax 50 mg in AM and 100 mg at night.Please advise.

## 2012-09-21 ENCOUNTER — Telehealth: Payer: Self-pay

## 2012-09-21 NOTE — Telephone Encounter (Signed)
Pt left v/m does not want to see endocrinologist at [facility] does not prefer to go to [facility]) Pt does want to see local doctor so pt request Dr Milinda Antis to follow her at this time and refill her Glipizide until endocrinologist not associated with [facility] comes to [facility] or [facility]; pt does not have time to go to [place]. Pt also was to have thyroid Ultrasound but was never scheduled; pt would like scheduled at [facility]>Please advise.

## 2012-09-21 NOTE — Telephone Encounter (Signed)
Sarah Chaney- are there other endocrinologists in [place] besides [facility]?  The media tab in her chart will not open - and I cannot see her last endocrine visit either - can anyone else open it? - I need to review her last endocrine note so I know when she needs to follow up with me ,thanks

## 2012-09-21 NOTE — Telephone Encounter (Signed)
Looks like she saw endocrinology in October so she is overdue for her 3 month follow up - please follow up with me and we will review her blood sugars at that time as well as refer her for thyroid ultrasound and rev her records  If she has ANY  additional things to discuss at that time please schedule a 30 min visit  Please refil any meds she needs before then

## 2012-09-21 NOTE — Telephone Encounter (Signed)
I got you the only office note that patient saw Dr Tedd Sias at [facility] . She did not make a FU with her nor did she have the Thyroid US that she wanted her to have. The note is in your in box! There are no other Endocrinologists in [place] besides [place].

## 2012-09-24 MED ORDER — GLIPIZIDE ER 5 MG PO TB24: ORAL_TABLET | ORAL | Status: DC

## 2012-09-24 NOTE — Telephone Encounter (Signed)
30 min appt scheduled with Dr. Milinda Antis on 10/03/12, pt said she isn't going back to [facility] and wants to discuss you watching her thyroid problem until another endo opens up in [place] and getting an U/S. Pt said will discuss all of this with you at her appt, and med refilled for a month

## 2012-09-24 NOTE — Telephone Encounter (Signed)
Pt  Called and left message on vm, she is waiting for a call back. She states she did go to endo at [facility] and she did not like that Dr, doesn't like the [facility] practice period. She wants referal to another endo, but cannot go to [facility] due to work. She does still need glipizide refill. Endo had scheduled her for a thyroid U/S she showed up for, but the tech wasn't there and they never called her back to reschedule although they said they would. She wants to know if you can just follow her dm until [place] eventually has an endo in [place].

## 2012-09-25 ENCOUNTER — Other Ambulatory Visit: Payer: Self-pay | Admitting: Cardiovascular Disease

## 2012-09-26 ENCOUNTER — Other Ambulatory Visit: Payer: Self-pay | Admitting: *Deleted

## 2012-09-26 MED ORDER — HYDROCHLOROTHIAZIDE 25 MG PO TABS: 25.0000 mg | ORAL_TABLET | Freq: Every day | ORAL | Status: DC

## 2012-09-26 NOTE — Telephone Encounter (Signed)
Refilled Hydrochlorothiazide. 

## 2012-10-03 ENCOUNTER — Encounter: Payer: Self-pay | Admitting: Family Medicine

## 2012-10-03 ENCOUNTER — Ambulatory Visit (INDEPENDENT_AMBULATORY_CARE_PROVIDER_SITE_OTHER): Payer: Managed Care, Other (non HMO) | Admitting: Family Medicine

## 2012-10-03 VITALS — BP 112/68 | HR 88 | Temp 98.6°F | Ht 68.0 in | Wt 229.0 lb

## 2012-10-03 DIAGNOSIS — E119 Type 2 diabetes mellitus without complications: Secondary | ICD-10-CM

## 2012-10-03 DIAGNOSIS — I10 Essential (primary) hypertension: Secondary | ICD-10-CM

## 2012-10-03 DIAGNOSIS — E039 Hypothyroidism, unspecified: Secondary | ICD-10-CM

## 2012-10-03 DIAGNOSIS — E063 Autoimmune thyroiditis: Secondary | ICD-10-CM

## 2012-10-03 LAB — COMPREHENSIVE METABOLIC PANEL
ALT: 106 U/L — ABNORMAL HIGH (ref 0–35)
AST: 102 U/L — ABNORMAL HIGH (ref 0–37)
Albumin: 3.9 g/dL (ref 3.5–5.2)
Calcium: 9.7 mg/dL (ref 8.4–10.5)
Chloride: 106 mEq/L (ref 96–112)
Creatinine, Ser: 0.9 mg/dL (ref 0.4–1.2)
Potassium: 4 mEq/L (ref 3.5–5.1)

## 2012-10-03 LAB — LIPID PANEL
Cholesterol: 197 mg/dL (ref 0–200)
LDL Cholesterol: 136 mg/dL — ABNORMAL HIGH (ref 0–99)
VLDL: 18 mg/dL (ref 0.0–40.0)

## 2012-10-03 MED ORDER — OMEPRAZOLE 40 MG PO CPDR: 40.0000 mg | DELAYED_RELEASE_CAPSULE | Freq: Two times a day (BID) | ORAL | Status: DC

## 2012-10-03 MED ORDER — GLIPIZIDE ER 5 MG PO TB24: ORAL_TABLET | ORAL | Status: DC

## 2012-10-03 NOTE — Patient Instructions (Addendum)
Keep up the good work with diet/exercise and weight loss - be proud of that  No change in medicine Lab today  Shirlee Limerick will call you about the thyroid ultrasound appt  Follow up in 3 months

## 2012-10-03 NOTE — Progress Notes (Signed)
Subjective:    Patient ID: Sarah Chaney, female    DOB: [birth year], 52 y.o.   MRN: [id number]  HPI Micah Flesher to [facility] for diabetes - endocrine - and she does not like it  She refuses to go back or go to [facility] for endocrinology   Is loosing weight and going to the gym and eating less and eating better  On onglyza 5 and also glipizide xl 5  When she checks her sugar - highest is in 160  Probably averages - ? She does not check enough to know   Due for labs  Going to the opthy soon  Is on ace   Needs to set her up with a thyroid ultrasound and check tsh also Lab Results  Component Value Date   TSH 7.17* 05/02/2012   had to go up on her dose  She has hashimotos   Patient Active Problem List  Diagnosis  . HYPOTHYROIDISM  . PURE HYPERCHOLESTEROLEMIA  . OBESITY  . ANEMIA-NOS  . INSOMNIA, CHRONIC  . HYPERTENSION  . GERD  . IBS  . FATTY LIVER DISEASE  . POLYARTHRITIS  . CHONDROMALACIA PATELLA, LEFT  . PAIN IN JOINT, MULTIPLE SITES  . ROTATOR CUFF SYNDROME, LEFT  . UNSPECIFIED NEURALGIA NEURITIS AND RADICULITIS  . DM2 (diabetes mellitus, type 2)  . POSITIVE PPD  . SEIZURES, HX OF  . COLONIC POLYPS, HX OF  . TOBACCO USE, QUIT  . PERIMENOPAUSAL STATUS  . RESTLESS LEG SYNDROME  . Migraine with aura  . Other screening mammogram  . Routine general medical examination at a health care facility  . History of pyelonephritis   Past Medical History  Diagnosis Date  . Anemia, unspecified   . Chondromalacia of patella   . Gastric polyps   . GERD (gastroesophageal reflux disease)   . Headache   . Unspecified essential hypertension   . Unspecified hypothyroidism   . Irritable bowel syndrome   . Persistent disorder of initiating or maintaining sleep   . Lateral epicondylitis  of elbow   . Variants of migraine, not elsewhere classified, without mention of intractable migraine without mention of status migrainosus   . Obesity, unspecified   . Palpitations   . Unspecified  polyarthropathy or polyarthritis, multiple sites   . 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 Pure hypercholesterolemia   . Restless legs syndrome (RLS)   . Disorders of bursae and tendons in shoulder region, unspecified   . Tachycardia, unspecified   . Personal history of tobacco use, presenting hazards to health   . Neuralgia, neuritis, and radiculitis, unspecified   . Diverticulosis   . Adenomatous colon polyp 1997   Past Surgical History  Procedure Laterality Date  . Popliteal synovial cyst excision  12/2003  . Esophagogastroduodenoscopy  12/2001  . Colonoscopy  12/2001    Negative  . Polypectomy  12/2001  . Abd [place]  12/2002    Fatty liver  . Esophagogastroduodenoscopy  11/2007    nml  . Abd [place]  05/2010    Fatty liver, normal gall bladder and pancreas  . Exercise treadmill  5/13    normal   History  Substance Use Topics  . Smoking status: Former Smoker -- 0.30 packs/day for 32 years    Types: Cigarettes    Quit date: 11/08/2011  . Smokeless tobacco: Never Used  . Alcohol Use: Yes     Comment: once a month   Family History  Problem Relation Age of  Onset  . Diabetes type II Father   . Diabetes Mother   . Hyperlipidemia Mother   . Diabetes      neice   Allergies  Allergen Reactions  . Carbamazepine     REACTION: hives  . Diltiazem Hcl     REACTION: chills, fever  . Metformin And Related Other (See Comments)    GI   . Nifedipine   . Verapamil     REACTION: edema   Current Outpatient Prescriptions on File Prior to Visit  Medication Sig Dispense Refill  . albuterol (PROVENTIL HFA;VENTOLIN HFA) 108 (90 BASE) MCG/ACT inhaler Inhale 2 puffs into the lungs every 4 (four) hours as needed for wheezing.  1 Inhaler  0  . aspirin 81 MG tablet Take 162 mg by mouth daily.       Marland Kitchen glipiZIDE (GLUCOTROL XL) 5 MG 24 hr tablet TAKE ONE TABLET BY MOUTH EVERY DAY  30 tablet  0  . hydrochlorothiazide (HYDRODIURIL) 25 MG tablet Take 1 tablet (25  mg total) by mouth daily.  30 tablet  3  . lisinopril (PRINIVIL) 10 MG tablet Take 1 tablet (10 mg total) by mouth daily.  30 tablet  11  . meloxicam (MOBIC) 15 MG tablet Take 15 mg by mouth daily.       . metoprolol (LOPRESSOR) 50 MG tablet Take 50 mg by mouth 2 (two) times daily.       . saxagliptin HCl (ONGLYZA) 5 MG TABS tablet Take 5 mg by mouth daily.      Marland Kitchen topiramate (TOPAMAX) 50 MG tablet Take 1 by mouth in am and 2 by mouth in pm  90 tablet  5   No current facility-administered medications on file prior to visit.      Review of Systems Review of Systems  Constitutional: Negative for fever, appetite change,  and unexpected weight change. pos for fatigue - from schedule Eyes: Negative for pain and visual disturbance.  Respiratory: Negative for cough and shortness of breath.   Cardiovascular: Negative for cp or palpitations    Gastrointestinal: Negative for nausea, diarrhea and constipation.  Genitourinary: Negative for urgency and frequency. neg for excessive thirst  Skin: Negative for pallor or rash   Neurological: Negative for weakness, light-headedness, numbness and headaches.  Hematological: Negative for adenopathy. Does not bruise/bleed easily.  Psychiatric/Behavioral: Negative for dysphoric mood. The patient is not nervous/anxious.         Objective:   Physical Exam  Constitutional: She appears well-developed and well-nourished. No distress.  overwt and well appearing   HENT:  Head: Normocephalic and atraumatic.  Mouth/Throat: Oropharynx is clear and moist.  Eyes: Conjunctivae and EOM are normal. Pupils are equal, round, and reactive to light. Right eye exhibits no discharge. Left eye exhibits no discharge. No scleral icterus.  Neck: Normal range of motion. Neck supple. No JVD present. Carotid bruit is not present. No thyromegaly present.  Cardiovascular: Normal rate, regular rhythm, normal heart sounds and intact distal pulses.  Exam reveals no gallop.    Pulmonary/Chest: Effort normal and breath sounds normal. No respiratory distress. She has no wheezes. She exhibits no tenderness.  Abdominal: Soft. Bowel sounds are normal. She exhibits no distension, no abdominal bruit and no mass. There is no tenderness.  Musculoskeletal: She exhibits no edema and no tenderness.  Lymphadenopathy:    She has no cervical adenopathy.  Neurological: She is alert. She has normal reflexes. She displays no tremor. No cranial nerve deficit. She exhibits normal muscle tone.  Coordination normal.  Skin: Skin is warm and dry. No rash noted. No erythema. No pallor.  Psychiatric: She has a normal mood and affect.          Assessment & Plan:

## 2012-10-03 NOTE — Assessment & Plan Note (Signed)
tsh today  Thyroid US planned (as her endocrine doctor wanted to do) for Hashimotos , ? Hx nodules and issues with swallowing/ throat fullness

## 2012-10-03 NOTE — Assessment & Plan Note (Signed)
On onglyza and glipizide - intol of metformin  She refuses to go to endo at [facility] clinic and does not want to go to [facility] either  Lab today Doing much better with lifestyle and wt loss  Commended F/u3 mo  opthy upcoming

## 2012-10-03 NOTE — Assessment & Plan Note (Signed)
Check thyroid US as req by endo when she went there Issues with swallowing Check tsh today

## 2012-10-03 NOTE — Assessment & Plan Note (Signed)
bp in fair control at this time  No changes needed  Disc lifstyle change with low sodium diet and exercise  Lab today 

## 2012-10-05 ENCOUNTER — Telehealth: Payer: Self-pay

## 2012-10-05 NOTE — Telephone Encounter (Signed)
Pt has appt to see Dr Milinda Antis on 10/08/12 at 11:30 am and request copy of recent labs and last AVS. Requested info faxed to [phone]. Pt advised done.

## 2012-10-08 ENCOUNTER — Encounter: Payer: Self-pay | Admitting: Family Medicine

## 2012-10-08 ENCOUNTER — Ambulatory Visit (INDEPENDENT_AMBULATORY_CARE_PROVIDER_SITE_OTHER): Payer: Managed Care, Other (non HMO) | Admitting: Family Medicine

## 2012-10-08 VITALS — BP 104/70 | HR 88 | Temp 98.6°F | Ht 68.0 in | Wt 228.5 lb

## 2012-10-08 DIAGNOSIS — E039 Hypothyroidism, unspecified: Secondary | ICD-10-CM

## 2012-10-08 DIAGNOSIS — E7800 Pure hypercholesterolemia, unspecified: Secondary | ICD-10-CM

## 2012-10-08 DIAGNOSIS — E119 Type 2 diabetes mellitus without complications: Secondary | ICD-10-CM

## 2012-10-08 DIAGNOSIS — R7401 Elevation of levels of liver transaminase levels: Secondary | ICD-10-CM

## 2012-10-08 DIAGNOSIS — K7689 Other specified diseases of liver: Secondary | ICD-10-CM

## 2012-10-08 DIAGNOSIS — R748 Abnormal levels of other serum enzymes: Secondary | ICD-10-CM

## 2012-10-08 LAB — HEPATIC FUNCTION PANEL
ALT: 123 U/L — ABNORMAL HIGH (ref 0–35)
AST: 114 U/L — ABNORMAL HIGH (ref 0–37)
Bilirubin, Direct: 0.1 mg/dL (ref 0.0–0.3)
Total Protein: 7.8 g/dL (ref 6.0–8.3)

## 2012-10-08 MED ORDER — LEVOTHYROXINE SODIUM 150 MCG PO TABS: 150.0000 ug | ORAL_TABLET | Freq: Every day | ORAL | Status: DC

## 2012-10-08 MED ORDER — GLIPIZIDE ER 5 MG PO TB24: ORAL_TABLET | ORAL | Status: DC

## 2012-10-08 NOTE — Assessment & Plan Note (Signed)
These went up  Hx of fatty liver- rev her CT and [place]  Check hepatitis panel and ferritin Does have RUQ pain at times If all ok - ref to GI-- ? HIDA

## 2012-10-08 NOTE — Patient Instructions (Addendum)
Check sugar am fasting and 2 hours after a meal - can try some odd times also  Eat regularly and take glipizide every day  More labs for liver today- will refer to GI if all is normal  Decrease thyroid dose to 150 mcg daily - if problems let me know  Schedule non fasting lab 1 month for tsh  Avoid red meat/ fried foods/ egg yolks/ fatty breakfast meats/ butter, cheese and high fat dairy/ and shellfish   Follow up with me in about 3 months

## 2012-10-08 NOTE — Assessment & Plan Note (Signed)
Lab Results  Component Value Date   TSH 0.32* 10/03/2012   Dec dose to 50 mcg Re check 1 mo No change in sympt

## 2012-10-08 NOTE — Progress Notes (Signed)
Subjective:    Patient ID: Sarah Chaney, female    DOB: [birth year], 52 y.o.   MRN: [id number]  HPI Here for f/u of labs/ chronic problems   DM Lab Results  Component Value Date   HGBA1C 7.7* 10/03/2012   she does not check sugar that regularly  On onglyza and glipizide There are times that she misses glipizide - if she does not eat regularly  Now she is absolutely taking it regularly  Now is starting to eat breakfast - and gets protien with every meal  Intol of metformin  Ast/alt up significantly Hx of fatty liver  Had CT abd /pelvis within the past year She has been to the ER twice for R sided abd pain  Never had a hida  Does not take tylenol and does not drink alcohol    Hypothyroid Lab Results  Component Value Date   TSH 0.32* 10/03/2012   This is low No symptoms or problems   Hyperlipidemia Lab Results  Component Value Date   CHOL 197 10/03/2012   CHOL 207* 06/29/2011   CHOL 231* 01/19/2010   Lab Results  Component Value Date   HDL 42.90 10/03/2012   HDL 40.98 06/29/2011   HDL 11.91 01/19/2010   Lab Results  Component Value Date   LDLCALC 136* 10/03/2012   Lab Results  Component Value Date   TRIG 90.0 10/03/2012   TRIG 137.0 06/29/2011   TRIG 151.0* 01/19/2010   Lab Results  Component Value Date   CHOLHDL 5 10/03/2012   CHOLHDL 5 06/29/2011   CHOLHDL 5 01/19/2010   Lab Results  Component Value Date   LDLDIRECT 138.8 06/29/2011   LDLDIRECT 168.3 01/19/2010   LDLDIRECT 156.9 09/18/2008   not at goal at this time Is pretty good with her diet overall Cannot have a statin due to her ast/alt Is starting a hard core exercise program- really happy with that  Does at least 30 min of cardio   Patient Active Problem List  Diagnosis  . HYPOTHYROIDISM  . PURE HYPERCHOLESTEROLEMIA  . OBESITY  . ANEMIA-NOS  . INSOMNIA, CHRONIC  . HYPERTENSION  . GERD  . IBS  . FATTY LIVER DISEASE  . POLYARTHRITIS  . CHONDROMALACIA PATELLA, LEFT  . PAIN IN JOINT, MULTIPLE SITES   . ROTATOR CUFF SYNDROME, LEFT  . UNSPECIFIED NEURALGIA NEURITIS AND RADICULITIS  . DM2 (diabetes mellitus, type 2)  . POSITIVE PPD  . SEIZURES, HX OF  . COLONIC POLYPS, HX OF  . TOBACCO USE, QUIT  . PERIMENOPAUSAL STATUS  . RESTLESS LEG SYNDROME  . Migraine with aura  . Other screening mammogram  . Routine general medical examination at a health care facility  . History of pyelonephritis  . Hashimoto's thyroiditis  . Abnormal transaminases   Past Medical History  Diagnosis Date  . Anemia, unspecified   . Chondromalacia of patella   . Gastric polyps   . GERD (gastroesophageal reflux disease)   . Headache   . Unspecified essential hypertension   . Unspecified hypothyroidism   . Irritable bowel syndrome   . Persistent disorder of initiating or maintaining sleep   . Lateral epicondylitis  of elbow   . Variants of migraine, not elsewhere classified, without mention of intractable migraine without mention of status migrainosus   . Obesity, unspecified   . Palpitations   . Unspecified polyarthropathy or polyarthritis, multiple sites   . 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 Pure hypercholesterolemia   .  Restless legs syndrome (RLS)   . Disorders of bursae and tendons in shoulder region, unspecified   . Tachycardia, unspecified   . Personal history of tobacco use, presenting hazards to health   . Neuralgia, neuritis, and radiculitis, unspecified   . Diverticulosis   . Adenomatous colon polyp 1997   Past Surgical History  Procedure Laterality Date  . Popliteal synovial cyst excision  12/2003  . Esophagogastroduodenoscopy  12/2001  . Colonoscopy  12/2001    Negative  . Polypectomy  12/2001  . Abd [place]  12/2002    Fatty liver  . Esophagogastroduodenoscopy  11/2007    nml  . Abd [place]  05/2010    Fatty liver, normal gall bladder and pancreas  . Exercise treadmill  5/13    normal   History  Substance Use Topics  . Smoking status: Former  Smoker -- 0.30 packs/day for 32 years    Types: Cigarettes    Quit date: 11/08/2011  . Smokeless tobacco: Never Used  . Alcohol Use: Yes     Comment: rare   Family History  Problem Relation Age of Onset  . Diabetes type II Father   . Diabetes Mother   . Hyperlipidemia Mother   . Diabetes      neice   Allergies  Allergen Reactions  . Carbamazepine     REACTION: hives  . Diltiazem Hcl     REACTION: chills, fever  . Metformin And Related Other (See Comments)    GI   . Nifedipine   . Verapamil     REACTION: edema   Current Outpatient Prescriptions on File Prior to Visit  Medication Sig Dispense Refill  . albuterol (PROVENTIL HFA;VENTOLIN HFA) 108 (90 BASE) MCG/ACT inhaler Inhale 2 puffs into the lungs every 4 (four) hours as needed for wheezing.  1 Inhaler  0  . aspirin 81 MG tablet Take 162 mg by mouth daily.       . hydrochlorothiazide (HYDRODIURIL) 25 MG tablet Take 1 tablet (25 mg total) by mouth daily.  30 tablet  3  . lisinopril (PRINIVIL) 10 MG tablet Take 1 tablet (10 mg total) by mouth daily.  30 tablet  11  . meloxicam (MOBIC) 15 MG tablet Take 15 mg by mouth daily.       . metoprolol (LOPRESSOR) 50 MG tablet Take 50 mg by mouth 2 (two) times daily.       Marland Kitchen omeprazole (PRILOSEC) 40 MG capsule Take 1 capsule (40 mg total) by mouth 2 (two) times daily.  60 capsule  11  . saxagliptin HCl (ONGLYZA) 5 MG TABS tablet Take 5 mg by mouth daily.      Marland Kitchen topiramate (TOPAMAX) 50 MG tablet Take 1 by mouth in am and 2 by mouth in pm  90 tablet  5  . zolpidem (AMBIEN) 10 MG tablet Take 1 tablet by mouth at bedtime.       No current facility-administered medications on file prior to visit.     Review of Systems Review of Systems  Constitutional: Negative for fever, appetite change,  and unexpected weight change.  Eyes: Negative for pain and visual disturbance.  Respiratory: Negative for cough and shortness of breath.   Cardiovascular: Negative for cp or palpitations   neg for  pedal edema Gastrointestinal: Negative for nausea, diarrhea and constipation. pos for occas RUQ pain- related to eating , neg for blood in stool or dark stool Genitourinary: Negative for urgency and frequency.  Skin: Negative for pallor or rash  Neurological: Negative for weakness, light-headedness, numbness and pos for intermittent headaches Hematological: Negative for adenopathy. Does not bruise/bleed easily.  Psychiatric/Behavioral: Negative for dysphoric mood. The patient is not nervous/anxious.         Objective:   Physical Exam  Constitutional: She appears well-developed and well-nourished. No distress.  obese and well appearing   HENT:  Head: Normocephalic and atraumatic.  Mouth/Throat: Oropharynx is clear and moist.  Eyes: Conjunctivae and EOM are normal. Pupils are equal, round, and reactive to light. No scleral icterus.  Neck: Normal range of motion. Neck supple. No JVD present. Carotid bruit is not present. No thyromegaly present.  Cardiovascular: Normal rate, regular rhythm, normal heart sounds and intact distal pulses.  Exam reveals no gallop.   Pulmonary/Chest: Effort normal and breath sounds normal. No respiratory distress. She has no wheezes. She exhibits no tenderness.  Abdominal: Soft. Bowel sounds are normal. She exhibits no distension, no abdominal bruit and no mass. There is no tenderness. There is no rebound and no guarding.  Mild RUQ tenderness  No rebound or guarding Neg murphy sign  Musculoskeletal: She exhibits no edema and no tenderness.  Lymphadenopathy:    She has no cervical adenopathy.  Neurological: She is alert. She has normal reflexes. She displays no tremor. No cranial nerve deficit. She exhibits normal muscle tone. Coordination normal.  Skin: Skin is warm and dry. No rash noted. No erythema. No pallor.  Psychiatric: She has a normal mood and affect.          Assessment & Plan:

## 2012-10-08 NOTE — Assessment & Plan Note (Signed)
Lab Results  Component Value Date   HGBA1C 7.7* 10/03/2012   Will be better about meals and glipizide Exercising now  F/u 3 mo -hope for imp Disc goals

## 2012-10-08 NOTE — Assessment & Plan Note (Signed)
Disc goals for lipids and reasons to control them Rev labs with pt Rev low sat fat diet in detail Unable to start statin now due to inc transaminases

## 2012-10-08 NOTE — Assessment & Plan Note (Signed)
?   If cause of ast/alt elevation Working on weight loss and low fat diet

## 2012-10-09 ENCOUNTER — Telehealth: Payer: Self-pay | Admitting: Family Medicine

## 2012-10-09 DIAGNOSIS — R748 Abnormal levels of other serum enzymes: Secondary | ICD-10-CM

## 2012-10-09 DIAGNOSIS — K7689 Other specified diseases of liver: Secondary | ICD-10-CM

## 2012-10-09 LAB — HEPATITIS PANEL, ACUTE: Hep B C IgM: NEGATIVE

## 2012-10-09 NOTE — Telephone Encounter (Signed)
Gi ref for elevated transaminases

## 2012-10-10 ENCOUNTER — Ambulatory Visit: Payer: Managed Care, Other (non HMO) | Admitting: Family Medicine

## 2012-10-11 ENCOUNTER — Ambulatory Visit: Payer: Self-pay | Admitting: Family Medicine

## 2012-10-11 ENCOUNTER — Encounter: Payer: Self-pay | Admitting: Family Medicine

## 2012-10-29 ENCOUNTER — Encounter: Payer: Self-pay | Admitting: Gastroenterology

## 2012-10-29 ENCOUNTER — Telehealth: Payer: Self-pay | Admitting: Gastroenterology

## 2012-10-29 ENCOUNTER — Other Ambulatory Visit (INDEPENDENT_AMBULATORY_CARE_PROVIDER_SITE_OTHER): Payer: Managed Care, Other (non HMO)

## 2012-10-29 ENCOUNTER — Ambulatory Visit (INDEPENDENT_AMBULATORY_CARE_PROVIDER_SITE_OTHER): Payer: Managed Care, Other (non HMO) | Admitting: Gastroenterology

## 2012-10-29 VITALS — BP 100/70 | HR 60 | Ht 68.0 in | Wt 229.2 lb

## 2012-10-29 DIAGNOSIS — R1011 Right upper quadrant pain: Secondary | ICD-10-CM

## 2012-10-29 DIAGNOSIS — R7401 Elevation of levels of liver transaminase levels: Secondary | ICD-10-CM

## 2012-10-29 DIAGNOSIS — K5900 Constipation, unspecified: Secondary | ICD-10-CM

## 2012-10-29 DIAGNOSIS — R7402 Elevation of levels of lactic acid dehydrogenase (LDH): Secondary | ICD-10-CM

## 2012-10-29 LAB — PROTIME-INR: Prothrombin Time: 10.6 s (ref 10.2–12.4)

## 2012-10-29 LAB — ALPHA-1-ANTITRYPSIN: A-1 Antitrypsin, Ser: 158 mg/dL (ref 90–200)

## 2012-10-29 LAB — IBC PANEL
Iron: 85 ug/dL (ref 42–145)
Transferrin: 285 mg/dL (ref 212.0–360.0)

## 2012-10-29 NOTE — Patient Instructions (Addendum)
  You have been scheduled for an abdominal ultrasound at [facility] (1st floor of hospital) on 10-31-2012 at 9:30 AM. Please arrive 15 minutes prior to your appointment for registration. Make certain not to have anything to eat or drink 6 hours prior to your appointment. Should you need to reschedule your appointment, please contact radiology at [phone]. This test typically takes about 30 minutes to perform.  Your physician has requested that you go to the basement for lab work before leaving today.  You can purchase Miralax over the counter and use once or twice daily for constipation.   Thank you for choosing [facility] and Dr. Russella Dar. ____________________________________________________________________________________________________________________                                               We are excited to introduce MyChart, a new best-in-class service that provides you online access to important information in your electronic medical record. We want to make it easier for you to view your health information - all in one secure location - when and where you need it. We expect MyChart will enhance the quality of care and service we provide.  When you register for MyChart, you can:    View your test results.    Request appointments and receive appointment reminders via email.    Request medication renewals.    View your medical history, allergies, medications and immunizations.    Communicate with your physician's office through a password-protected site.    Conveniently print information such as your medication lists.  To find out if MyChart is right for you, please talk to a member of our clinical staff today. We will gladly answer your questions about this free health and wellness tool.  If you are age 52 or older and want a member of your family to have access to your record, you must provide written consent by completing a proxy form available at  our office. Please speak to our clinical staff about guidelines regarding accounts for patients younger than age 70.  As you activate your MyChart account and need any technical assistance, please call the MyChart technical support line at [phone]) or email your question to [email or web address]. If you email your question(s), please include your name, a return phone number and the best time to reach you.  If you have non-urgent health-related questions, you can send a message to our office through MyChart at [email or web address]. If you have a medical emergency, call 911.  Thank you for using MyChart as your new health and wellness resource!   MyChart licensed from [employer],  9528-4132. Patents Pending.

## 2012-10-29 NOTE — Telephone Encounter (Signed)
Patient wants her ultrasound to be rescheduled to [facility] which is facility of [facility] because she lives in [place] and works down the street from this facility. Patient gave me the phone number to this facility which is [phone]. I called and scheduled the ultrasound for 10/31/12 at 8:00am at [facility] and faxed the order to [phone]. Called [facility] to cancel the ultrasound at that facility.

## 2012-10-29 NOTE — Progress Notes (Signed)
History of Present Illness: This is a 52 year old female who was accompanied by her husband. She has several gastrointestinal complaints. The most concerning to her is recurrent episodes of right upper quadrant and right back pain for the past several months. She's been seen in [facility] emergency room on 2 occasions for the symptoms. The first time in October 2013 a urinary tract infection was diagnosed. CT scan performed in October 2013 at [facility] showed fatty infiltration of the liver.  she states she presented again to the emergency room couple months later with similar complaints with no abnormalities uncovered and she was told of a likely muscle strain. She complains of fullness, nausea and early satiety which she has noted for several years. She previously canceled a gastric emptying scan in 2011. She has had intermittent problems with constipation for several years. She has had slight abnormalities of her ALT noted over the past few years but her recent liver function tests showed her AST and ALT both over 100. Colonoscopy and upper endoscopy performed in May 2011 showed small adenomatous colon polyps and benign gastric fundic polyps. Denies weight loss, diarrhea, change in stool caliber, melena, hematochezia, vomiting, dysphagia, reflux symptoms, chest pain.  Current Medications, Allergies, Past Medical History, Past Surgical History, Family History and Social History were reviewed in [employer] record.  Physical Exam: General: Well developed , well nourished, obese, no acute distress Head: Normocephalic and atraumatic Eyes:  sclerae anicteric, EOMI Ears: Normal auditory acuity Mouth: No deformity or lesions Lungs: Clear throughout to auscultation Heart: Regular rate and rhythm; no murmurs, rubs or bruits Abdomen: Soft, non tender and non distended. No masses, hepatosplenomegaly or hernias noted. Normal Bowel sounds Musculoskeletal: Symmetrical with  no gross deformities  Pulses:  Normal pulses noted Extremities: No clubbing, cyanosis, edema or deformities noted Neurological: Alert oriented x 4, grossly nonfocal Psychological:  Alert and cooperative. Normal mood and affect  Assessment and Recommendations:  1. RUQ and right back pain. Rule out cholelithiasis. Schedule abdominal ultrasound.  2. Elevated transaminases. Known fatty infiltration of the liver may be the cause. An abdominal ultrasound as above. Send standard hepatic serologies. Viral serologies and ferritin were negative.  3. Constipation. Begin MiraLax daily to twice a day titrated for adequate bowel movements  4. GERD. Continue omeprazole 40 mg daily and standard antireflux measures.  5. R/O gastroparesis. If abdominal ultrasound does not reveal cholelithiasis consider a gastric emptying scan with [facility].  6. Personal history of adenomatous colon polyps. Surveillance colonoscopy due May 2016.  7. Irritable bowel syndrome. If no clear cause of her pain is found consider a trial of antispasmodics.

## 2012-10-31 ENCOUNTER — Ambulatory Visit: Payer: Self-pay

## 2012-10-31 ENCOUNTER — Telehealth: Payer: Self-pay

## 2012-10-31 ENCOUNTER — Ambulatory Visit (HOSPITAL_COMMUNITY): Payer: Managed Care, Other (non HMO)

## 2012-10-31 NOTE — Telephone Encounter (Signed)
Patient returned my call and we informed her of abdominal ultrasound results. Patient states she will do the Miralax like Dr. Russella Dar suggested and call back if she continues to have abdominal pain. Agreed with plan with patient.  Abdominal ultrasound scanned into Epic.

## 2012-10-31 NOTE — Telephone Encounter (Signed)
Received fax from [facility] of Ultrasound report. Called and left a message for patient to return my call.

## 2012-11-20 ENCOUNTER — Other Ambulatory Visit: Payer: Self-pay | Admitting: Family Medicine

## 2012-11-21 MED ORDER — SAXAGLIPTIN HCL 5 MG PO TABS: 5.0000 mg | ORAL_TABLET | Freq: Every day | ORAL | Status: DC

## 2012-11-21 NOTE — Telephone Encounter (Signed)
Ok to refill 

## 2012-11-21 NOTE — Telephone Encounter (Signed)
Rx called in as prescribed and pt notified  

## 2012-11-21 NOTE — Telephone Encounter (Signed)
Px written for call in  For ambien onglyza refilled electronically

## 2012-11-21 NOTE — Telephone Encounter (Signed)
Pt left v/m cking on refill status of onglyza and zolpidem to [facility] [address].Please advise.

## 2012-11-26 ENCOUNTER — Ambulatory Visit (INDEPENDENT_AMBULATORY_CARE_PROVIDER_SITE_OTHER)
Admission: RE | Admit: 2012-11-26 | Discharge: 2012-11-26 | Disposition: A | Discharge status: Home / Self Care | Payer: Managed Care, Other (non HMO) | Source: Ambulatory Visit | Attending: Family Medicine | Admitting: Family Medicine

## 2012-11-26 ENCOUNTER — Ambulatory Visit (INDEPENDENT_AMBULATORY_CARE_PROVIDER_SITE_OTHER): Payer: Managed Care, Other (non HMO) | Admitting: Family Medicine

## 2012-11-26 ENCOUNTER — Encounter: Payer: Self-pay | Admitting: Family Medicine

## 2012-11-26 VITALS — BP 120/60 | HR 76 | Temp 97.6°F | Ht 68.0 in | Wt 227.5 lb

## 2012-11-26 DIAGNOSIS — M25559 Pain in unspecified hip: Secondary | ICD-10-CM

## 2012-11-26 DIAGNOSIS — M25552 Pain in left hip: Secondary | ICD-10-CM

## 2012-11-26 MED ORDER — PREDNISONE 20 MG PO TABS: ORAL_TABLET | ORAL | Status: DC

## 2012-11-26 NOTE — Progress Notes (Signed)
[facility] [address] [place] [address] Phone: [phone] Fax: [phone]  Date:  11/26/2012   Name:  Sarah Chaney   DOB:  04/16/1961   MRN:  [id number] Gender: female Age: 52 y.o.  Primary Physician:  Roxy Manns, MD  Evaluating MD: Hannah Beat, MD  Chief Complaint: Hip Pain   History of Present Illness:  Sarah Chaney is a 52 y.o. very pleasant female patient who presents with the following:  About a month, difficulty, anterior to the side of her hip and also posterior and into her knee. No numbness, but excruciating pain. With up on it will hurt. She is having groin pain as well. Some posterior and lateral pain as well. The pain GTB. No back pain. She has had some sciatica in the past, and this does not feel the same and no radiculopathy down the entirety of her leg.  Past Medical History, Surgical History, Social History, Family History, Problem List, Medications, and Allergies have been reviewed and updated if relevant.  Current Outpatient Prescriptions on File Prior to Visit  Medication Sig Dispense Refill  . albuterol (PROVENTIL HFA;VENTOLIN HFA) 108 (90 BASE) MCG/ACT inhaler Inhale 2 puffs into the lungs every 4 (four) hours as needed for wheezing.  1 Inhaler  0  . aspirin 81 MG tablet Take 162 mg by mouth daily.       Marland Kitchen glipiZIDE (GLUCOTROL XL) 5 MG 24 hr tablet TAKE ONE TABLET BY MOUTH EVERY DAY  30 tablet  11  . hydrochlorothiazide (HYDRODIURIL) 25 MG tablet Take 1 tablet (25 mg total) by mouth daily.  30 tablet  3  . levothyroxine (SYNTHROID, LEVOTHROID) 150 MCG tablet Take 1 tablet (150 mcg total) by mouth daily.  30 tablet  3  . lisinopril (PRINIVIL) 10 MG tablet Take 1 tablet (10 mg total) by mouth daily.  30 tablet  11  . meloxicam (MOBIC) 15 MG tablet Take 15 mg by mouth daily.       . metoprolol (LOPRESSOR) 50 MG tablet Take 50 mg by mouth 2 (two) times daily.       Marland Kitchen omeprazole (PRILOSEC) 40 MG capsule Take 1 capsule (40 mg  total) by mouth 2 (two) times daily.  60 capsule  11  . saxagliptin HCl (ONGLYZA) 5 MG TABS tablet Take 1 tablet (5 mg total) by mouth daily.  30 tablet  11  . zolpidem (AMBIEN) 10 MG tablet TAKE ONE TABLET BY MOUTH AT BEDTIME AS NEEDED FOR SLEEP  30 tablet  3   No current facility-administered medications on file prior to visit.    Review of Systems:  GEN: No fevers, chills. Nontoxic. Primarily MSK c/o today. MSK: Detailed in the HPI GI: tolerating PO intake without difficulty Neuro: No numbness, parasthesias, or tingling associated. Otherwise the pertinent positives of the ROS are noted above.    Physical Examination: BP 120/60  Pulse 76  Temp(Src) 97.6 F (36.4 C) (Oral)  Ht 5\' 8"  (1.727 m)  Wt 227 lb 8 oz (103.193 kg)  BMI 34.6 kg/m2  SpO2 96%   GEN: WDWN, NAD, Non-toxic, Alert & Oriented x 3 HEENT: Atraumatic, Normocephalic.  Ears and Nose: No external deformity. EXTR: No clubbing/cyanosis/edema NEURO: Normal gait.  PSYCH: Normally interactive. Conversant. Not depressed or anxious appearing.  Calm demeanor.   HIP EXAM: SIDE: L ROM: Abduction, Flexion, Internal and External range of motion: mild restriction at terminal internal range of motion internal and external range of motion  Pain with terminal IROM and EROM: both GTB: NT SLR: NEG Knees: No effusion FABER: NT REVERSE FABER: NT, neg Piriformis: NT at direct palpation Str: flexion: 5/5 abduction: 5/5 adduction: 5/5 Strength testing non-tender   Dg Hip Complete Left  11/26/2012  *RADIOLOGY REPORT*  Clinical Data: Left hip pain.  LEFT HIP - COMPLETE 2+ VIEW  Comparison: None  Findings: Both hips are normally located.  No acute fracture or plain film evidence of avascular necrosis.  The pubic symphysis and SI joints are intact.  Os acetabuli noted bilaterally.  Spina bifida occulta noted at L5.  IMPRESSION: No acute bony findings or significant degenerative changes.   Original Report Authenticated By: Rudie Meyer,  M.D.     Assessment and Plan:  Left hip pain - Plan: DG Hip Complete Left  The joint spaces are well preserved, but I suspect that she is having a component of both intra-articular pain as well as some of her hip flexors and rotators. I may give her a pulse of oral steroids, and gave her AAOS hip rehab. She is also to try to resume walking and doing the elliptical machine and about 50% capacity initially.  Follow up with me in 6-8 weeks if not improving  Orders Today:  Orders Placed This Encounter  Procedures  . DG Hip Complete Left    Standing Status: Future     Number of Occurrences: 1     Standing Expiration Date: 01/26/2014    Order Specific Question:  Preferred imaging location?    Answer:  [facility]    Order Specific Question:  Reason for exam:    Answer:  hip pain    Updated Medication List: (Includes new medications, updates to list, dose adjustments) Meds ordered this encounter  Medications  . topiramate (TOPAMAX) 50 MG tablet    Sig: Take 50 mg by mouth daily.  . predniSONE (DELTASONE) 20 MG tablet    Sig: 2 tabs a day for 5 days, then 1 po for 5 days    Dispense:  15 tablet    Refill:  0    Medications Discontinued: Medications Discontinued During This Encounter  Medication Reason  . topiramate (TOPAMAX) 50 MG tablet       Signed, Lanya Bucks T. Ozzy Bohlken, MD 11/26/2012 4:02 PM

## 2012-11-28 ENCOUNTER — Ambulatory Visit: Payer: Managed Care, Other (non HMO) | Admitting: Family Medicine

## 2013-01-26 ENCOUNTER — Other Ambulatory Visit: Payer: Self-pay | Admitting: Cardiovascular Disease

## 2013-01-28 ENCOUNTER — Other Ambulatory Visit: Payer: Self-pay | Admitting: *Deleted

## 2013-01-28 MED ORDER — HYDROCHLOROTHIAZIDE 25 MG PO TABS: 25.0000 mg | ORAL_TABLET | Freq: Every day | ORAL | Status: DC

## 2013-01-28 NOTE — Telephone Encounter (Signed)
Refilled Hydrochlorothiazide sent to [facility].

## 2013-01-30 ENCOUNTER — Encounter: Payer: Self-pay | Admitting: *Deleted

## 2013-01-31 ENCOUNTER — Other Ambulatory Visit: Payer: Self-pay

## 2013-02-05 ENCOUNTER — Other Ambulatory Visit: Payer: Self-pay | Admitting: Vascular Surgery

## 2013-02-05 LAB — HEPATIC FUNCTION PANEL A (ARMC)
Albumin: 3.6 g/dL (ref 3.4–5.0)
Alkaline Phosphatase: 110 U/L (ref 50–136)
Bilirubin, Direct: 0.1 mg/dL (ref 0.00–0.20)
Bilirubin,Total: 0.4 mg/dL (ref 0.2–1.0)
SGOT(AST): 118 U/L — ABNORMAL HIGH (ref 15–37)
SGPT (ALT): 123 U/L — ABNORMAL HIGH (ref 12–78)
Total Protein: 7.4 g/dL (ref 6.4–8.2)

## 2013-02-13 ENCOUNTER — Other Ambulatory Visit: Payer: Self-pay | Admitting: *Deleted

## 2013-02-13 MED ORDER — LEVOTHYROXINE SODIUM 150 MCG PO TABS: 150.0000 ug | ORAL_TABLET | Freq: Every day | ORAL | Status: DC

## 2013-03-20 ENCOUNTER — Other Ambulatory Visit: Payer: Self-pay | Admitting: Family Medicine

## 2013-03-20 NOTE — Telephone Encounter (Signed)
Px written for call in   

## 2013-03-20 NOTE — Telephone Encounter (Signed)
Rx called in as prescribed 

## 2013-03-20 NOTE — Telephone Encounter (Signed)
Pt called to apologize pt is out of medication and request refill zolpidem today; pt said she had not slept in 2 nights. Pt does not need call back unless problem with refill.

## 2013-03-21 ENCOUNTER — Other Ambulatory Visit (INDEPENDENT_AMBULATORY_CARE_PROVIDER_SITE_OTHER): Payer: Managed Care, Other (non HMO)

## 2013-03-21 ENCOUNTER — Telehealth (INDEPENDENT_AMBULATORY_CARE_PROVIDER_SITE_OTHER): Payer: Managed Care, Other (non HMO) | Admitting: Family Medicine

## 2013-03-21 DIAGNOSIS — E7800 Pure hypercholesterolemia, unspecified: Secondary | ICD-10-CM

## 2013-03-21 DIAGNOSIS — K7689 Other specified diseases of liver: Secondary | ICD-10-CM

## 2013-03-21 DIAGNOSIS — E039 Hypothyroidism, unspecified: Secondary | ICD-10-CM

## 2013-03-21 DIAGNOSIS — E119 Type 2 diabetes mellitus without complications: Secondary | ICD-10-CM

## 2013-03-21 NOTE — Telephone Encounter (Signed)
Message copied by Judy Pimple on Thu Mar 21, 2013 10:49 AM ------      Message from: Alvina Chou      Created: Thu Mar 21, 2013 10:44 AM      Regarding: Lab orders asap       Lab orders, no f/u ------

## 2013-03-22 LAB — HEPATIC FUNCTION PANEL
Albumin: 3.6 g/dL (ref 3.5–5.2)
Alkaline Phosphatase: 78 U/L (ref 39–117)
Total Protein: 6.6 g/dL (ref 6.0–8.3)

## 2013-03-22 LAB — TSH: TSH: 0.73 u[IU]/mL (ref 0.35–5.50)

## 2013-03-22 LAB — LIPID PANEL
Cholesterol: 200 mg/dL (ref 0–200)
LDL Cholesterol: 138 mg/dL — ABNORMAL HIGH (ref 0–99)
Triglycerides: 86 mg/dL (ref 0.0–149.0)

## 2013-03-26 ENCOUNTER — Other Ambulatory Visit: Payer: Self-pay | Admitting: Family Medicine

## 2013-03-27 ENCOUNTER — Telehealth: Payer: Self-pay | Admitting: Family Medicine

## 2013-03-27 DIAGNOSIS — E119 Type 2 diabetes mellitus without complications: Secondary | ICD-10-CM

## 2013-03-27 NOTE — Telephone Encounter (Signed)
Message copied by Judy Pimple on Wed Mar 27, 2013  9:53 AM ------      Message from: Carlton Adam      Created: Wed Mar 27, 2013  9:07 AM       Dr April Holding would like you to refer her to Dr Elvera Lennox, she wants a new Endocrinologist. Pls put referral in. Shirlee Limerick  ------

## 2013-04-18 ENCOUNTER — Telehealth: Payer: Self-pay

## 2013-04-18 NOTE — Telephone Encounter (Signed)
Pt request immunization record; pt's employer joining [employer] and employee health needs verification of immunization. Immunization printed thru release of information format and copy at front desk for pick up. Pt notified by v/m

## 2013-04-25 ENCOUNTER — Encounter: Payer: Self-pay | Admitting: Internal Medicine

## 2013-04-25 ENCOUNTER — Ambulatory Visit (INDEPENDENT_AMBULATORY_CARE_PROVIDER_SITE_OTHER): Payer: Managed Care, Other (non HMO) | Admitting: Internal Medicine

## 2013-04-25 VITALS — BP 118/68 | HR 81 | Temp 97.1°F | Resp 12 | Ht 67.0 in | Wt 234.5 lb

## 2013-04-25 DIAGNOSIS — E119 Type 2 diabetes mellitus without complications: Secondary | ICD-10-CM

## 2013-04-25 NOTE — Patient Instructions (Signed)
Start 100 mg of Invokana daily in am.  Please return in 1 month with your sugar log.   PATIENT INSTRUCTIONS FOR TYPE 2 DIABETES:  DIET AND EXERCISE Diet and exercise is an important part of diabetic treatment.  We recommended aerobic exercise in the form of brisk walking (working between 40-60% of maximal aerobic capacity, similar to brisk walking) for 150 minutes per week (such as 30 minutes five days per week) along with 3 times per week performing 'resistance' training (using various gauge rubber tubes with handles) 5-10 exercises involving the major muscle groups (upper body, lower body and core) performing 10-15 repetitions (or near fatigue) each exercise. Start at half the above goal but build slowly to reach the above goals. If limited by weight, joint pain, or disability, we recommend daily walking in a swimming pool with water up to waist to reduce pressure from joints while allow for adequate exercise.    BLOOD GLUCOSES Monitoring your blood glucoses is important for continued management of your diabetes. Please check your blood glucoses 2-4 times a day: fasting, before meals and at bedtime (you can rotate these measurements - e.g. one day check before the 3 meals, the next day check before 2 of the meals and before bedtime, etc.   HYPOGLYCEMIA (low blood sugar) Hypoglycemia is usually a reaction to not eating, exercising, or taking too much insulin/ other diabetes drugs.  Symptoms include tremors, sweating, hunger, confusion, headache, etc. Treat IMMEDIATELY with 15 grams of Carbs:   4 glucose tablets    cup regular juice/soda   2 tablespoons raisins   4 teaspoons sugar   1 tablespoon honey Recheck blood glucose in 15 mins and repeat above if still symptomatic/blood glucose <100. Please contact our office at [phone] if you have questions about how to next handle your insulin.  RECOMMENDATIONS TO REDUCE YOUR RISK OF DIABETIC COMPLICATIONS: * Take your prescribed  MEDICATION(S). * Follow a DIABETIC diet: Complex carbs, fiber rich foods, heart healthy fish twice weekly, (monounsaturated and polyunsaturated) fats * AVOID saturated/trans fats, high fat foods, >2,300 mg salt per day. * EXERCISE at least 5 times a week for 30 minutes or preferably daily.  * DO NOT SMOKE OR DRINK more than 1 drink a day. * Check your FEET every day. Do not wear tightfitting shoes. Contact us if you develop an ulcer * See your EYE doctor once a year or more if needed * Get a FLU shot once a year * Get a PNEUMONIA vaccine once before and once after age 12 years  GOALS:  * Your Hemoglobin A1c of <7%  * fasting sugars need to be <130 * after meals sugars need to be <180 (2h after you start eating) * Your Systolic BP should be 140 or lower  * Your Diastolic BP should be 80 or lower  * Your HDL (Good Cholesterol) should be 40 or higher  * Your LDL (Bad Cholesterol) should be 100 or lower  * Your Triglycerides should be 150 or lower  * Your Urine microalbumin (kidney function) should be <30 * Your Body Mass Index should be 25 or lower   We will be glad to help you achieve these goals. Our telephone number is: [phone].

## 2013-04-25 NOTE — Progress Notes (Signed)
Patient ID: Sarah Chaney, female   DOB: [birth year], 52 y.o.   MRN: [id number]  HPI: Sarah Chaney is a 52 y.o.-year-old female, referred by her PCP, Dr. Milinda Antis, for management of DM2, non-insulin-dependent, uncontrolled, without complications (? Peripheral neuropathy).  Patient has been diagnosed with diabetes in 2012. Last hemoglobin A1c was: Lab Results  Component Value Date   HGBA1C 8.2* 03/21/2013    Pt is on a regimen of: - Onglyza 5 mg  - Glipizide XL 5 mg Not on Metformin b/c GI upset (AP, not necessarily diarrhea).  Pt checks her sugars 2x a day and they are: - am: 150-180 - 2h after lunch: 200s - bedtime: low 200s-250s No lows. Lowest sugar was 62 (once) ; she has hypoglycemia awareness at 100. Highest sugar was 292.  Pt's meals are: - Breakfast: usually skips, but will start eating it - Lunch: Lean Cuisine, yoghurt - Dinner: vegetables + meat + starch - Snacks: frozen yoghurt, but not snacks much; cheese doodles; cheese, fruit Drinks diet cokes daily: 2x 16-oz bottle Goes to the gym 3x a week  - no CKD, last BUN/creatinine:  Lab Results  Component Value Date   BUN 20 10/03/2012   CREATININE 0.9 10/03/2012  On Lisinopril. - last set of lipids: Lab Results  Component Value Date   CHOL 200 03/21/2013   HDL 44.80 03/21/2013   LDLCALC 138* 03/21/2013   LDLDIRECT 138.8 06/29/2011   TRIG 86.0 03/21/2013   CHOLHDL 4 03/21/2013  Not on a statin b/c leg pain. - last eye exam was in 02/2013. No DR.  - no numbness and tingling in her feet.  Pt has FH of DM in father, niece - type 1, mother  ROS: Constitutional: + weight gain/loss, no fatigue, + subjective hyperthermia (hot flushes), + poor sleep, + increased urination Eyes: no blurry vision, no xerophthalmia ENT: no sore throat, no nodules palpated in throat, no dysphagia/odynophagia, no hoarseness; + tinnitus Cardiovascular: no CP/SOB/+ palpitations/leg swelling Respiratory: no cough/SOB Gastrointestinal: + N/no V/D/C/no  heartburn Musculoskeletal: + muscle/+ joint aches Skin: no rashes Neurological: no tremors/numbness/tingling/dizziness; + HA Psychiatric: no depression/anxiety  Past Medical History  Diagnosis Date  . Anemia, unspecified   . Chondromalacia of patella   . Gastric polyps   . GERD (gastroesophageal reflux disease)   . Headache(784.0)   . Unspecified essential hypertension   . Unspecified hypothyroidism   . Irritable bowel syndrome   . Persistent disorder of initiating or maintaining sleep   . Lateral epicondylitis  of elbow   . Variants of migraine, not elsewhere classified, without mention of intractable migraine without mention of status migrainosus   . Obesity, unspecified   . Palpitations   . Unspecified polyarthropathy or polyarthritis, multiple sites   . 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 Pure hypercholesterolemia   . Restless legs syndrome (RLS)   . Disorders of bursae and tendons in shoulder region, unspecified   . Tachycardia, unspecified   . Personal history of tobacco use, presenting hazards to health   . Neuralgia, neuritis, and radiculitis, unspecified   . Diverticulosis   . Adenomatous colon polyp 1997  . Diabetes mellitus    Past Surgical History  Procedure Laterality Date  . Popliteal synovial cyst excision  12/2003  . Esophagogastroduodenoscopy  12/2001  . Colonoscopy  12/2001    Negative  . Polypectomy  12/2001  . Abd [place]  12/2002    Fatty liver  . Esophagogastroduodenoscopy  11/2007  nml  . Abd [place]  05/2010    Fatty liver, normal gall bladder and pancreas  . Exercise treadmill  5/13    normal   History   Social History  . Marital Status: Married    Spouse Name: N/A    Number of Children: 2   Occupational History  . Medical Assistant (cardiovasc and vein)    Social History Main Topics  . Smoking status: Former Smoker -- 0.30 packs/day for 32 years    Types: Cigarettes    Quit date: 11/08/2011  .  Smokeless tobacco: Never Used  . Alcohol Use: Yes     Comment: rare  . Drug Use: No  . Sexual Activity: Yes    Partners: Male   Social History Narrative   Married with 2 daughters   Gets regular exercise: 30 mins cardio and weights   Daily caffeine use: 4 per day, coffee and sodas   Helps care for her grandchild   Regular exercise: just started back   Caffeine use: daily   Current Outpatient Prescriptions on File Prior to Visit  Medication Sig Dispense Refill  . aspirin 81 MG tablet Take 162 mg by mouth daily.       Marland Kitchen glipiZIDE (GLUCOTROL XL) 5 MG 24 hr tablet TAKE ONE TABLET BY MOUTH EVERY DAY  30 tablet  11  . hydrochlorothiazide (HYDRODIURIL) 25 MG tablet Take 1 tablet (25 mg total) by mouth daily.  30 tablet  3  . levothyroxine (SYNTHROID, LEVOTHROID) 150 MCG tablet Take 1 tablet (150 mcg total) by mouth daily.  30 tablet  6  . lisinopril (PRINIVIL) 10 MG tablet Take 1 tablet (10 mg total) by mouth daily.  30 tablet  11  . meloxicam (MOBIC) 15 MG tablet Take 15 mg by mouth daily.       . metoprolol (LOPRESSOR) 50 MG tablet Take 50 mg by mouth 2 (two) times daily.       Marland Kitchen omeprazole (PRILOSEC) 40 MG capsule Take 1 capsule (40 mg total) by mouth 2 (two) times daily.  60 capsule  11  . predniSONE (DELTASONE) 20 MG tablet 2 tabs a day for 5 days, then 1 po for 5 days  15 tablet  0  . saxagliptin HCl (ONGLYZA) 5 MG TABS tablet Take 1 tablet (5 mg total) by mouth daily.  30 tablet  11  . topiramate (TOPAMAX) 50 MG tablet Take 50 mg by mouth daily.      Marland Kitchen zolpidem (AMBIEN) 10 MG tablet TAKE ONE TABLET BY MOUTH AT BEDTIME AS NEEDED FOR SLEEP  30 tablet  3  . albuterol (PROVENTIL HFA;VENTOLIN HFA) 108 (90 BASE) MCG/ACT inhaler Inhale 2 puffs into the lungs every 4 (four) hours as needed for wheezing.  1 Inhaler  0   No current facility-administered medications on file prior to visit.   Allergies  Allergen Reactions  . Carbamazepine     REACTION: hives  . Diltiazem Hcl     REACTION:  chills, fever  . Metformin And Related Other (See Comments)    GI   . Nifedipine   . Verapamil     REACTION: edema   Family History  Problem Relation Age of Onset  . Diabetes type II Father   . Diabetes Mother   . Hyperlipidemia Mother   . Diabetes      neice   PE: BP 118/68  Pulse 81  Temp(Src) 97.1 F (36.2 C) (Oral)  Resp 12  Ht 5\' 7"  (1.702 m)  Wt 234 lb 8 oz (106.369 kg)  BMI 36.72 kg/m2  SpO2 96% Wt Readings from Last 3 Encounters:  04/25/13 234 lb 8 oz (106.369 kg)  11/26/12 227 lb 8 oz (103.193 kg)  10/29/12 229 lb 3.2 oz (103.964 kg)  Constitutional: overweight, in NAD Eyes: PERRLA, EOMI, no exophthalmos ENT: moist mucous membranes, no thyromegaly, no cervical lymphadenopathy Cardiovascular: RRR, No MRG Respiratory: CTA B Gastrointestinal: abdomen soft, NT, ND, BS+ Musculoskeletal: no deformities, strength intact in all 4 Skin: moist, warm, no rashes Neurological: no tremor with outstretched hands, DTR normal in all 4  ASSESSMENT: 1. DM2, non-insulin-dependent, uncontrolled, without complications  PLAN:  1. Patient with long-standing, recently more uncontrolled diabetes, on oral antidiabetic regimen, which became insufficient - We discussed about options for treatment, and I suggested to:   Continue Glipizide 5 mg  Continue Onglyza 5 mg  Add Invokana 100 mg daily in am x 1 mo >> then return for reevaluation >> may increase to 300 mg daily then. We discussed about SEs of Invokana, which are: dizziness (advised to be careful when stands from sitting position), decreased BP - usually not < normal (BP today is not low), and fungal UTIs (advised to let me know if develops one).  - discussed about diet >> not to skip meals; advised to cut out sodas - advised her to start checking her sugars at different times of the day - check 2 times a day, rotating checks - given sugar log and advised how to fill it and to bring it at next appt  - given foot care handout and  explained the principles  - given instructions for hypoglycemia management "15-15 rule"  - advised for yearly eye exams >> she is up to date - given flu vaccine - Return to clinic in one month with sugar log

## 2013-04-26 ENCOUNTER — Other Ambulatory Visit: Payer: Self-pay | Admitting: Family Medicine

## 2013-05-02 ENCOUNTER — Telehealth: Payer: Self-pay | Admitting: *Deleted

## 2013-05-02 NOTE — Telephone Encounter (Signed)
I am not aware of tongue soreness with Invokana, try to stay hydrated, but if not better, might give a trial off Invokana for few days and then retry to see if sxs return.

## 2013-05-02 NOTE — Telephone Encounter (Signed)
Pt called stating that when she began taking the Invokana she had really bad headaches each day. Headaches subsided yesterday. Now her tongue is really sore, to the point of being painful. Is this a side effect? What should she do? [phone], ext [phone]). Please advise.

## 2013-05-02 NOTE — Telephone Encounter (Signed)
Called pt and advised her per Dr Elvera Lennox to stay hydrated, but if not better, she might should give a trial of being off the [facility] for a few days and then retry going back on it and see if the sxs return. Pt understood and said she may do that.

## 2013-05-14 ENCOUNTER — Telehealth: Payer: Self-pay | Admitting: *Deleted

## 2013-05-14 NOTE — Telephone Encounter (Signed)
Pt called and lvm stating that she has had BP issues. Her BP has been real low. She stopped taking her HCTZ on Sunday. She has been monitoring her BP today was 98/58. It has been running around 99/60. That is why she stopped the HCTZ. She wanted to know if Dr Elvera Lennox feels that the new med she is on could be causing her BP to run low. These numbers are low for her. Please advise.

## 2013-05-14 NOTE — Telephone Encounter (Signed)
It can be from the Invokana. Please advise her to stay hydrated and stay off the HCTZ for now. If BP still low, please advise her to check with Dr Milinda Antis if she can decrease the beta blocker (Metoprolol) dose or the Lisinopril. If not, we need to stop Invokana.Marland Kitchen

## 2013-05-14 NOTE — Telephone Encounter (Signed)
Called pt and advised her per Dr Elvera Lennox that it can be from the [facility]. Advised her to stay hydrated and stay off the HCTZ for now. If BP still low, please advise her to check with Dr Milinda Antis if she can decrease the beta blocker (Metoprolol) dose or the Lisinopril. If not, we need to stop Invokana. Pt understood.

## 2013-05-21 ENCOUNTER — Encounter: Payer: Self-pay | Admitting: Radiology

## 2013-05-22 ENCOUNTER — Ambulatory Visit: Payer: Managed Care, Other (non HMO) | Admitting: Family Medicine

## 2013-05-22 ENCOUNTER — Encounter: Payer: Self-pay | Admitting: Internal Medicine

## 2013-05-22 ENCOUNTER — Ambulatory Visit (INDEPENDENT_AMBULATORY_CARE_PROVIDER_SITE_OTHER): Payer: Managed Care, Other (non HMO) | Admitting: Internal Medicine

## 2013-05-22 VITALS — BP 112/78 | HR 84 | Temp 97.9°F | Resp 12 | Wt 232.8 lb

## 2013-05-22 DIAGNOSIS — E119 Type 2 diabetes mellitus without complications: Secondary | ICD-10-CM

## 2013-05-22 MED ORDER — CANAGLIFLOZIN 100 MG PO TABS: 100.0000 mg | ORAL_TABLET | Freq: Every day | ORAL | Status: DC

## 2013-05-22 NOTE — Progress Notes (Signed)
Patient ID: Sarah Chaney, female   DOB: 1960/10/19, 52 y.o.   MRN: [id number]  HPI: Sarah Chaney is a 52 y.o.-year-old female, returning for f/u for DM2, dx 2012, non-insulin-dependent, uncontrolled, without complications (? Peripheral neuropathy). Last visit 1 mo ago.   Last hemoglobin A1c was: Lab Results  Component Value Date   HGBA1C 8.2* 03/21/2013   HGBA1C 7.7* 10/03/2012   HGBA1C 7.8* 05/02/2012   Pt is on a regimen of: - Onglyza 5 mg  - Glipizide XL 5 mg Not on Metformin b/c GI upset (AP, not necessarily diarrhea).  Pt checks her sugars 2x a day and they are MUCH improved: - am: 150-180 >> 138-156 - before lunch: 122-150 - 2h after lunch: 200s >> 149, one value - before dinner: 138-150 - bedtime: low 200s-250s >> 125-141 No lows. Lowest sugar was 62 (once) ; she has hypoglycemia awareness at 100. Highest sugar was 156.  - no CKD, last BUN/creatinine:  Lab Results  Component Value Date   BUN 20 10/03/2012   CREATININE 0.9 10/03/2012  On Lisinopril. - last set of lipids: Lab Results  Component Value Date   CHOL 200 03/21/2013   HDL 44.80 03/21/2013   LDLCALC 138* 03/21/2013   LDLDIRECT 138.8 06/29/2011   TRIG 86.0 03/21/2013   CHOLHDL 4 03/21/2013  Not on a statin b/c leg pain. - last eye exam was in 02/2013. No DR.  - no numbness and tingling in her feet (only 1 toe >> post surgery on foot)  ROS: Constitutional: no weight gain/loss, no fatigue,  + increased urination Eyes: no blurry vision, no xerophthalmia ENT: no sore throat, no nodules palpated in throat, no dysphagia/odynophagia, no hoarseness; + tinnitus Cardiovascular: no CP/SOB/ palpitations/leg swelling Respiratory: no cough/SOB Gastrointestinal: + N/no V/D/C/no heartburn Musculoskeletal: no muscle/ joint aches Skin: no rashes Neurological: no tremors/numbness/tingling/dizziness; + HA  I reviewed pt's medications, allergies, PMH, social hx, family hx and no changes required, except as mentioned above.  PE: BP  112/78  Pulse 84  Temp(Src) 97.9 F (36.6 C) (Oral)  Resp 12  Wt 232 lb 12.8 oz (105.597 kg)  BMI 36.45 kg/m2  SpO2 96% Wt Readings from Last 3 Encounters:  05/22/13 232 lb 12.8 oz (105.597 kg)  04/25/13 234 lb 8 oz (106.369 kg)  11/26/12 227 lb 8 oz (103.193 kg)  Constitutional: overweight, in NAD Eyes: PERRLA, EOMI, no exophthalmos ENT: moist mucous membranes, no thyromegaly, no cervical lymphadenopathy Cardiovascular: RRR, No MRG Respiratory: CTA B Gastrointestinal: abdomen soft, NT, ND, BS+ Musculoskeletal: no deformities, strength intact in all 4 Skin: moist, warm, no rashes Foot exam performed today  ASSESSMENT: 1. DM2, non-insulin-dependent, uncontrolled, without complications  PLAN:  1. Patient with long-standing,with much improved control after addition of Invokana at last visit -  I suggested to:   Continue Glipizide 5 mg  Continue Onglyza 5 mg  Continue Invokana 100 mg daily in am x 1 mo >> had hypotension and dizziness >> stopped diuretic >> feels back to normal now  - refilled Invokana - does not skip meals anymore - given flu vaccine at last visit - check Hb A1c at next visit - Return to clinic in 3 months with sugar log

## 2013-05-22 NOTE — Patient Instructions (Signed)
Please continue current regimen.  Keep up the great work! Please return in 3 months with your sugar log.

## 2013-05-23 ENCOUNTER — Telehealth: Payer: Self-pay | Admitting: Family Medicine

## 2013-05-23 NOTE — Telephone Encounter (Signed)
Fax from [employer] saying that Insurance rejected RX for Canagliflozin 100mg  tabs stating, "formulary step therapy, generic metformin product, plan limitation exceeded."  Cost to pt. $355.96

## 2013-05-24 ENCOUNTER — Telehealth: Payer: Self-pay | Admitting: *Deleted

## 2013-05-24 NOTE — Telephone Encounter (Signed)
Called pt and she has the red discount card [place] Invokana. Pt states she is going today to try to use this to get her rx. If she has any problems she will call us on Monday. Be advised.

## 2013-05-24 NOTE — Telephone Encounter (Signed)
Shannon, please see below. 

## 2013-05-24 NOTE — Telephone Encounter (Signed)
Sarah Chaney, please have her come back to get one of the red discount cards (for [employer]) >> should get 12 mo worth of Invokana for free.

## 2013-05-27 ENCOUNTER — Other Ambulatory Visit: Payer: Self-pay | Admitting: Cardiovascular Disease

## 2013-05-29 ENCOUNTER — Encounter: Payer: Self-pay | Admitting: Family Medicine

## 2013-05-29 ENCOUNTER — Ambulatory Visit (INDEPENDENT_AMBULATORY_CARE_PROVIDER_SITE_OTHER): Payer: Managed Care, Other (non HMO) | Admitting: Family Medicine

## 2013-05-29 VITALS — BP 104/72 | HR 80 | Temp 97.3°F | Ht 68.0 in | Wt 233.2 lb

## 2013-05-29 DIAGNOSIS — H5712 Ocular pain, left eye: Secondary | ICD-10-CM

## 2013-05-29 DIAGNOSIS — H5710 Ocular pain, unspecified eye: Secondary | ICD-10-CM

## 2013-05-29 MED ORDER — OLOPATADINE HCL 0.2 % OP SOLN: 1.0000 [drp] | Freq: Two times a day (BID) | OPHTHALMIC | Status: DC

## 2013-05-29 NOTE — Progress Notes (Signed)
Pre-visit discussion using our clinic review tool. No additional management support is needed unless otherwise documented below in the visit note.  

## 2013-05-29 NOTE — Patient Instructions (Signed)
We will refer you to opthy at check out  Please try not to rub eye You can try the pataday drops to see if they help  Avoid eye makeup  If suddenly worse - or you loose your vision- call and get urgent medical attention

## 2013-05-29 NOTE — Progress Notes (Signed)
Subjective:    Patient ID: Sarah Chaney, female    DOB: [birth year], 52 y.o.   MRN: [id number]  HPI Here with eye symptoms   Going on for 3 weeks  eye pain  Redness  Clear drainage  Some blurriness  Comes and goes  No rash at all   Used "soothing drops"  Visine  Has had an upper resp infection as well- getting over that -- but the eye symptoms started  She is hoarse   Once she did get a headache with it - ? Migraine Had light flashes in eyes -several months ago - and saw opthy-neg exam and it stopped   This is the eye she wears contact lens in - she did change it (has not worn one since last fri)  No trauma to the eye   Patient Active Problem List   Diagnosis Date Noted  . Abnormal transaminases 10/08/2012  . Hashimoto's thyroiditis 10/03/2012  . History of pyelonephritis 06/24/2012  . Routine general medical examination at a health care facility 06/26/2011  . Other screening mammogram 04/22/2011  . RESTLESS LEG SYNDROME 08/19/2010  . Migraine with aura 08/19/2010  . DM2 (diabetes mellitus, type 2) 01/26/2010  . 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 UNSPECIFIED NEURALGIA NEURITIS AND RADICULITIS 06/30/2009  . SEIZURES, HX OF 05/20/2009  . ROTATOR CUFF SYNDROME, LEFT 05/05/2009  . POLYARTHRITIS 07/03/2008  . PERIMENOPAUSAL STATUS 07/03/2008  . PURE HYPERCHOLESTEROLEMIA 05/25/2007  . INSOMNIA, CHRONIC 03/23/2007  . PAIN IN JOINT, MULTIPLE SITES 03/23/2007  . HYPOTHYROIDISM 03/20/2007  . OBESITY 03/20/2007  . ANEMIA-NOS 03/20/2007  . HYPERTENSION 03/20/2007  . GERD 03/20/2007  . IBS 03/20/2007  . FATTY LIVER DISEASE 03/20/2007  . POSITIVE PPD 03/20/2007  . COLONIC POLYPS, HX OF 03/20/2007  . TOBACCO USE, QUIT 03/20/2007   Past Medical History  Diagnosis Date  . Anemia, unspecified   . Chondromalacia of patella   . Gastric polyps   . GERD (gastroesophageal reflux disease)   . Headache(784.0)   . Unspecified essential hypertension   . Unspecified  hypothyroidism   . Irritable bowel syndrome   . Persistent disorder of initiating or maintaining sleep   . Lateral epicondylitis  of elbow   . Variants of migraine, not elsewhere classified, without mention of intractable migraine without mention of status migrainosus   . Obesity, unspecified   . Palpitations   . Unspecified polyarthropathy or polyarthritis, multiple sites   . 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 Pure hypercholesterolemia   . Restless legs syndrome (RLS)   . Disorders of bursae and tendons in shoulder region, unspecified   . Tachycardia, unspecified   . Personal history of tobacco use, presenting hazards to health   . Neuralgia, neuritis, and radiculitis, unspecified   . Diverticulosis   . Adenomatous colon polyp 1997  . Diabetes mellitus    Past Surgical History  Procedure Laterality Date  . Popliteal synovial cyst excision  12/2003  . Esophagogastroduodenoscopy  12/2001  . Colonoscopy  12/2001    Negative  . Polypectomy  12/2001  . Abd [place]  12/2002    Fatty liver  . Esophagogastroduodenoscopy  11/2007    nml  . Abd [place]  05/2010    Fatty liver, normal gall bladder and pancreas  . Exercise treadmill  5/13    normal   History  Substance Use Topics  . Smoking status: Former Smoker -- 0.30 packs/day for 32 years    Types: Cigarettes  Quit date: 11/08/2011  . Smokeless tobacco: Never Used  . Alcohol Use: Yes     Comment: rare   Family History  Problem Relation Age of Onset  . Diabetes type II Father   . Diabetes Mother   . Hyperlipidemia Mother   . Diabetes      neice   Allergies  Allergen Reactions  . Carbamazepine     REACTION: hives  . Diltiazem Hcl     REACTION: chills, fever  . Metformin And Related Other (See Comments)    GI   . Nifedipine   . Verapamil     REACTION: edema   Current Outpatient Prescriptions on File Prior to Visit  Medication Sig Dispense Refill  . albuterol (PROVENTIL  HFA;VENTOLIN HFA) 108 (90 BASE) MCG/ACT inhaler Inhale 2 puffs into the lungs every 4 (four) hours as needed for wheezing.  1 Inhaler  0  . Canagliflozin (INVOKANA) 100 MG TABS Take 1 tablet (100 mg total) by mouth daily before breakfast.  90 tablet  3  . FREESTYLE LITE test strip       . glipiZIDE (GLUCOTROL XL) 5 MG 24 hr tablet TAKE ONE TABLET BY MOUTH EVERY DAY  30 tablet  11  . levothyroxine (SYNTHROID, LEVOTHROID) 150 MCG tablet Take 1 tablet (150 mcg total) by mouth daily.  30 tablet  6  . lisinopril (PRINIVIL,ZESTRIL) 10 MG tablet TAKE ONE TABLET BY MOUTH EVERY DAY  30 tablet  5  . metoprolol (LOPRESSOR) 50 MG tablet Take 50 mg by mouth 2 (two) times daily.       Marland Kitchen omeprazole (PRILOSEC) 40 MG capsule Take 1 capsule (40 mg total) by mouth 2 (two) times daily.  60 capsule  11  . saxagliptin HCl (ONGLYZA) 5 MG TABS tablet Take 1 tablet (5 mg total) by mouth daily.  30 tablet  11  . zolpidem (AMBIEN) 10 MG tablet TAKE ONE TABLET BY MOUTH AT BEDTIME AS NEEDED FOR SLEEP  30 tablet  3   No current facility-administered medications on file prior to visit.    Review of Systems Review of Systems  Constitutional: Negative for fever, appetite change, fatigue and unexpected weight change.  Eyes: pos for pain and visual disturbance. (in L eye only), pos for tearing and neg for purulent drainage ENT neg for uri symptoms Respiratory: Negative for cough and shortness of breath.   Cardiovascular: Negative for cp or palpitations    Gastrointestinal: Negative for nausea, diarrhea and constipation.  Genitourinary: Negative for urgency and frequency.  Skin: Negative for pallor or rash   Neurological: Negative for weakness, light-headedness, numbness and headaches.  Hematological: Negative for adenopathy. Does not bruise/bleed easily.  Psychiatric/Behavioral: Negative for dysphoric mood. The patient is not nervous/anxious.         Objective:   Physical Exam  Constitutional: She appears well-developed  and well-nourished. No distress.  obese and well appearing   HENT:  Head: Normocephalic and atraumatic.  Right Ear: External ear normal.  Left Ear: External ear normal.  Nose: Nose normal.  Mouth/Throat: Oropharynx is clear and moist.  Eyes: EOM are normal. Pupils are equal, round, and reactive to light. No scleral icterus.  L eye - mildly inj conj medially- no swelling Some tearing  No trauma or lid swelling No gross vision deficit  Grossly nl fundoscopy Nl EOMs    Neck: Normal range of motion. Neck supple.  Cardiovascular: Normal rate and regular rhythm.   Lymphadenopathy:    She has no cervical adenopathy.  Neurological: She is alert. She has normal reflexes.  Skin: Skin is warm and dry. No rash noted. No erythema.  Psychiatric: She has a normal mood and affect.          Assessment & Plan:

## 2013-05-30 ENCOUNTER — Other Ambulatory Visit: Payer: Self-pay

## 2013-05-30 NOTE — Assessment & Plan Note (Signed)
Intermittent for 3 weeks /occ blurry vision/ tearing and no trauma or rash hx in a contact lens wearer  No head or temple tenderness Diff incl all conjunctivitis/ opth migraine/ ? Uveitis or iritis and other Px pataday drops to try Ref to opthy for further eval

## 2013-06-07 ENCOUNTER — Other Ambulatory Visit: Payer: Self-pay | Admitting: *Deleted

## 2013-06-07 ENCOUNTER — Telehealth: Payer: Self-pay

## 2013-06-07 MED ORDER — METOPROLOL TARTRATE 50 MG PO TABS: 50.0000 mg | ORAL_TABLET | Freq: Two times a day (BID) | ORAL | Status: DC

## 2013-06-07 NOTE — Telephone Encounter (Signed)
Requested Prescriptions   Signed Prescriptions Disp Refills  . metoprolol (LOPRESSOR) 50 MG tablet 60 tablet 1    Sig: Take 1 tablet (50 mg total) by mouth 2 (two) times daily.    Authorizing Provider: Antonieta Iba    Ordering User: Kendrick Fries

## 2013-06-07 NOTE — Telephone Encounter (Signed)
Sent refill for Metoprolol to local pharmacy.

## 2013-06-07 NOTE — Telephone Encounter (Signed)
lmtcb

## 2013-06-12 ENCOUNTER — Ambulatory Visit: Payer: Managed Care, Other (non HMO) | Admitting: Cardiovascular Disease

## 2013-06-19 ENCOUNTER — Encounter: Payer: Self-pay | Admitting: Cardiovascular Disease

## 2013-06-19 ENCOUNTER — Ambulatory Visit (INDEPENDENT_AMBULATORY_CARE_PROVIDER_SITE_OTHER): Payer: Managed Care, Other (non HMO) | Admitting: Cardiovascular Disease

## 2013-06-19 VITALS — BP 110/78 | HR 71 | Ht 68.0 in | Wt 225.0 lb

## 2013-06-19 DIAGNOSIS — I10 Essential (primary) hypertension: Secondary | ICD-10-CM

## 2013-06-19 DIAGNOSIS — E119 Type 2 diabetes mellitus without complications: Secondary | ICD-10-CM

## 2013-06-19 DIAGNOSIS — R42 Dizziness and giddiness: Secondary | ICD-10-CM

## 2013-06-19 DIAGNOSIS — E7800 Pure hypercholesterolemia, unspecified: Secondary | ICD-10-CM

## 2013-06-19 DIAGNOSIS — K7689 Other specified diseases of liver: Secondary | ICD-10-CM

## 2013-06-19 DIAGNOSIS — R002 Palpitations: Secondary | ICD-10-CM

## 2013-06-19 MED ORDER — LISINOPRIL 10 MG PO TABS: 10.0000 mg | ORAL_TABLET | Freq: Every day | ORAL | Status: DC

## 2013-06-19 MED ORDER — METOPROLOL TARTRATE 50 MG PO TABS: 50.0000 mg | ORAL_TABLET | Freq: Two times a day (BID) | ORAL | Status: DC

## 2013-06-19 NOTE — Assessment & Plan Note (Signed)
Blood pressure is well controlled on today's visit. No changes made to the medications. 

## 2013-06-19 NOTE — Progress Notes (Signed)
Patient ID: Sarah Chaney, female    DOB: January 22, 1961, 52 y.o.   MRN: [id number]  HPI Comments: [name] is a very pleasant 52 year old woman with a history of hypertension, hyperlipidemia, palpitations who was seen previousl  for palpitations which has been relatively well controlled on Toprol who presents for routine followup. She currently works at [employer] in [place]. She stopped smoking 2 years ago.Weight continues to be a problem. Diagnosed with NASH.  In general she reports that she is doing well. Occasional palpitations that seem to be well controlled on metoprolol.  She also has occasional spinning/dizziness coming on at rest. Episodes are very short period. Feels like the room is spinning. Previous history of myalgias on statins. Hemoglobin A1c is greater than 8. Diet noncompliant, weight has been trending up. LFTs have been climbing over the course of the past year, most recently checked several months ago with slight drop. She has been seen by GI.  She does take proton pump inhibitor for GERD. She also reports having slow GI motility   EKG shows normal sinus rhythm with rate 71 beats per minute with no significant ST or T wave changes.  Outpatient Encounter Prescriptions as of 06/19/2013  Medication Sig  . aspirin 81 MG tablet Take 162 mg by mouth daily.  . Canagliflozin (INVOKANA) 100 MG TABS Take 1 tablet (100 mg total) by mouth daily before breakfast.  . FREESTYLE LITE test strip   . glipiZIDE (GLUCOTROL XL) 5 MG 24 hr tablet TAKE ONE TABLET BY MOUTH EVERY DAY  . levothyroxine (SYNTHROID, LEVOTHROID) 150 MCG tablet Take 1 tablet (150 mcg total) by mouth daily.  Marland Kitchen lisinopril (PRINIVIL,ZESTRIL) 10 MG tablet TAKE ONE TABLET BY MOUTH EVERY DAY  . metoprolol (LOPRESSOR) 50 MG tablet Take 1 tablet (50 mg total) by mouth 2 (two) times daily.  Marland Kitchen omeprazole (PRILOSEC) 40 MG capsule Take 1 capsule (40 mg total) by mouth 2 (two) times daily.  . saxagliptin HCl (ONGLYZA) 5  MG TABS tablet Take 1 tablet (5 mg total) by mouth daily.  Marland Kitchen zolpidem (AMBIEN) 10 MG tablet TAKE ONE TABLET BY MOUTH AT BEDTIME AS NEEDED FOR SLEEP  . [DISCONTINUED] albuterol (PROVENTIL HFA;VENTOLIN HFA) 108 (90 BASE) MCG/ACT inhaler Inhale 2 puffs into the lungs every 4 (four) hours as needed for wheezing.  . [DISCONTINUED] Olopatadine HCl 0.2 % SOLN Apply 1 drop to eye 2 (two) times daily.    Review of Systems  Constitutional: Negative.   HENT: Negative.   Eyes: Negative.   Cardiovascular: Positive for palpitations.  Gastrointestinal: Negative.   Endocrine: Negative.   Musculoskeletal: Negative.   Skin: Negative.   Allergic/Immunologic: Negative.   Neurological: Negative.   Hematological: Negative.   Psychiatric/Behavioral: Negative.   All other systems reviewed and are negative.   BP 110/78  Pulse 71  Ht 5\' 8"  (1.727 m)  Wt 225 lb (102.059 kg)  BMI 34.22 kg/m2  Physical Exam  Nursing note and vitals reviewed. Constitutional: She is oriented to person, place, and time. She appears well-developed and well-nourished.  Obese  HENT:  Head: Normocephalic.  Nose: Nose normal.  Mouth/Throat: Oropharynx is clear and moist.  Eyes: Conjunctivae are normal. Pupils are equal, round, and reactive to light.  Neck: Normal range of motion. Neck supple. No JVD present.  Cardiovascular: Normal rate, regular rhythm, S1 normal, S2 normal, normal heart sounds and intact distal pulses.  Exam reveals no gallop and no friction rub.   No murmur heard. Pulmonary/Chest: Effort  normal and breath sounds normal. No respiratory distress. She has no wheezes. She has no rales. She exhibits no tenderness.  Abdominal: Soft. Bowel sounds are normal. She exhibits no distension. There is no tenderness.  Musculoskeletal: Normal range of motion. She exhibits no edema and no tenderness.  Lymphadenopathy:    She has no cervical adenopathy.  Neurological: She is alert and oriented to person, place, and time.  Coordination normal.  Skin: Skin is warm and dry. No rash noted. No erythema.  Psychiatric: She has a normal mood and affect. Her behavior is normal. Judgment and thought content normal.    Assessment and Plan

## 2013-06-19 NOTE — Assessment & Plan Note (Signed)
Encouraged weight loss, strict diet. 

## 2013-06-19 NOTE — Assessment & Plan Note (Signed)
Cholesterol is elevated for underlying diabetes. Goal LDL less than 100. Encouraged her to increase her oatmeal, fiber, try red yeast rice, encouraged weight loss and increasing her exercise

## 2013-06-19 NOTE — Patient Instructions (Addendum)
You are doing well. No medication changes were made.  Ask about victoza Ask about cymbalta   Try Red yeast Rice  Please call us if you have new issues that need to be addressed before your next appt.  Your physician wants you to follow-up in: 6 months.  You will receive a reminder letter in the mail two months in advance. If you don't receive a letter, please call our office to schedule the follow-up appointment.

## 2013-06-19 NOTE — Assessment & Plan Note (Signed)
We have encouraged continued exercise, careful diet management in an effort to lose weight. Dietary guide given today

## 2013-07-16 ENCOUNTER — Emergency Department: Payer: Self-pay | Admitting: Emergency Medicine

## 2013-07-16 LAB — CBC WITH DIFFERENTIAL/PLATELET
Basophil #: 0.1 10*3/uL (ref 0.0–0.1)
Basophil %: 1.3 %
Eosinophil %: 4.2 %
HGB: 13.7 g/dL (ref 12.0–16.0)
Lymphocyte #: 2.1 10*3/uL (ref 1.0–3.6)
Lymphocyte %: 28.2 %
MCH: 27 pg (ref 26.0–34.0)
MCHC: 32.5 g/dL (ref 32.0–36.0)
Neutrophil #: 4.2 10*3/uL (ref 1.4–6.5)
Neutrophil %: 56.2 %
RBC: 5.07 10*6/uL (ref 3.80–5.20)
WBC: 7.4 10*3/uL (ref 3.6–11.0)

## 2013-07-16 LAB — BASIC METABOLIC PANEL
Anion Gap: 1 — ABNORMAL LOW (ref 7–16)
Calcium, Total: 9.6 mg/dL (ref 8.5–10.1)
Creatinine: 0.75 mg/dL (ref 0.60–1.30)
EGFR (African American): >60
Sodium: 136 mmol/L (ref 136–145)

## 2013-07-16 LAB — URINALYSIS, COMPLETE
Glucose,UR: >=500 mg/dL (ref 0–75)
Ketone: NEGATIVE
Ph: 6 (ref 4.5–8.0)
Protein: NEGATIVE
RBC,UR: 107 /HPF (ref 0–5)
Specific Gravity: 1.021 (ref 1.003–1.030)
Squamous Epithelial: 2

## 2013-07-29 ENCOUNTER — Telehealth: Payer: Self-pay | Admitting: *Deleted

## 2013-07-29 NOTE — Telephone Encounter (Signed)
Received prior auth request from pharmacy. Auth forms obtained, completed, and faxed to [employer]. Pending notification from insurance.

## 2013-07-30 ENCOUNTER — Ambulatory Visit: Payer: Self-pay | Admitting: Family Medicine

## 2013-07-31 ENCOUNTER — Encounter: Payer: Self-pay | Admitting: Family Medicine

## 2013-07-31 NOTE — Telephone Encounter (Signed)
Approval form received by insurance company for omeprazole. Will send to provider for signature, then to be scanned into patients medical record. Pharmacy notified via fax.

## 2013-08-07 ENCOUNTER — Ambulatory Visit: Payer: Self-pay | Admitting: Podiatry

## 2013-08-15 ENCOUNTER — Other Ambulatory Visit: Payer: Self-pay | Admitting: Family Medicine

## 2013-08-16 NOTE — Telephone Encounter (Signed)
Pt request status of ambien refill.

## 2013-08-16 NOTE — Telephone Encounter (Signed)
Px written for call in   

## 2013-08-16 NOTE — Telephone Encounter (Signed)
Electronic refill request, please advise  

## 2013-08-16 NOTE — Telephone Encounter (Signed)
Rx called in as prescribed 

## 2013-08-21 ENCOUNTER — Ambulatory Visit (INDEPENDENT_AMBULATORY_CARE_PROVIDER_SITE_OTHER): Payer: Managed Care, Other (non HMO) | Admitting: Internal Medicine

## 2013-08-21 ENCOUNTER — Encounter: Payer: Self-pay | Admitting: Internal Medicine

## 2013-08-21 VITALS — BP 108/68 | HR 80 | Temp 97.6°F | Resp 12 | Wt 232.8 lb

## 2013-08-21 DIAGNOSIS — E119 Type 2 diabetes mellitus without complications: Secondary | ICD-10-CM

## 2013-08-21 LAB — HEMOGLOBIN A1C: Hgb A1c MFr Bld: 8.2 % — ABNORMAL HIGH (ref 4.6–6.5)

## 2013-08-21 MED ORDER — CANAGLIFLOZIN 300 MG PO TABS: ORAL_TABLET | ORAL | Status: DC

## 2013-08-21 NOTE — Progress Notes (Signed)
Patient ID: Sarah Chaney, female   DOB: [birth year], 53 y.o.   MRN: [id number]  HPI: Sarah Chaney is a 53 y.o.-year-old female, returning for f/u for DM2, dx 2012, non-insulin-dependent, uncontrolled, without complications (? Peripheral neuropathy). Last visit 3 mo ago.   Last hemoglobin A1c was: Lab Results  Component Value Date   HGBA1C 8.2* 03/21/2013   HGBA1C 7.7* 10/03/2012   HGBA1C 7.8* 05/02/2012   Pt is on a regimen of: - Onglyza 5 mg  - Glipizide XL 5 mg - Invokana 100 mg Not on Metformin b/c GI upset (AP, not necessarily diarrhea). She tells me that she sometimes she gets shaky if she takes Glipizide and does not eat consistently. She tells me she takes sporadically. She tolerates well the Invokana, with only some vaginal itching.  Pt checks her sugars 4x a week and they are higher: - am: 150-180 >> 138-156 >> 150s - before lunch: 122-150 >> n/c - 2h after lunch: 200s >> 149, one value >> 185s - before dinner: 138-150 >> n/c - bedtime: low 200s-250s >> 125-141 >> 160-170 No lows. Lowest sugar was 61 ; she has hypoglycemia awareness at 100. Highest sugar was 200.  - no CKD, last BUN/creatinine:  Lab Results  Component Value Date   BUN 20 10/03/2012   CREATININE 0.9 10/03/2012  On Lisinopril. - last set of lipids: Lab Results  Component Value Date   CHOL 200 03/21/2013   HDL 44.80 03/21/2013   LDLCALC 138* 03/21/2013   LDLDIRECT 138.8 06/29/2011   TRIG 86.0 03/21/2013   CHOLHDL 4 03/21/2013  Not on a statin b/c leg pain. - last eye exam was in 02/2013. No DR.  - no numbness and tingling in her feet (only 1 toe >> post surgery on foot). Foot exam performed in 04/2013.  ROS: Constitutional: no weight gain/loss, +  fatigue,  + increased urination Eyes: no blurry vision, no xerophthalmia ENT: no sore throat, no nodules palpated in throat, no dysphagia/odynophagia, + hoarseness; + tinnitus Cardiovascular: no CP/SOB/ + palpitations/no leg swelling Respiratory: no  cough/SOB Gastrointestinal: + N/no V/D/C/no heartburn Musculoskeletal: + muscle/no  joint aches Skin: no rashes Neurological: no tremors/numbness/tingling/dizziness; + HA  I reviewed pt's medications, allergies, PMH, social hx, family hx and no changes required, except as mentioned above, except she stopped HCTZ and started Meloxicam.  PE: BP 108/68  Pulse 80  Temp(Src) 97.6 F (36.4 C) (Oral)  Resp 12  Wt 232 lb 12.8 oz (105.597 kg)  SpO2 97% Wt Readings from Last 3 Encounters:  08/21/13 232 lb 12.8 oz (105.597 kg)  06/19/13 225 lb (102.059 kg)  05/29/13 233 lb 4 oz (105.802 kg)  Constitutional: overweight, in NAD Eyes: PERRLA, EOMI, no exophthalmos ENT: moist mucous membranes, no thyromegaly, no cervical lymphadenopathy Cardiovascular: RRR, No MRG Respiratory: CTA B Gastrointestinal: abdomen soft, NT, ND, BS+ Musculoskeletal: no deformities, strength intact in all 4 Skin: moist, warm, no rashes  ASSESSMENT: 1. DM2, non-insulin-dependent, uncontrolled, without complications  PLAN:  1. Patient with long-standing,with much improved control after addition of Invokana, but now with worsened control in last 3 months. She has SEs from Glipizide >> hypoglycemic sxs >> takes it sporadically >> will stop -  I suggested to:   Stop Glipizide 5 mg  Continue Onglyza 5 mg  Continue Invokana but increase to 300 mg daily in am  - given flu vaccine this season - check Hb A1c today - Return to clinic in 3 months with sugar log   Office  Visit on 08/21/2013  Component Date Value Range Status  . Hemoglobin A1C 08/21/2013 8.2* 4.6 - 6.5 % Final   Glycemic Control Guidelines for People with Diabetes:Non Diabetic:  <6%Goal of Therapy: <7%Additional Action Suggested:  >8%    Stable, high >> cont. plan as above.

## 2013-08-21 NOTE — Patient Instructions (Signed)
Stop Glipizide 5 mg Continue Onglyza 5 mg Continue Invokana but increase to 300 mg daily in am   Please come back for a follow-up appointment in 3 months - with your sugar log >> check sugars once a day at least at different times of the day.  Please stop by the lab.

## 2013-08-23 ENCOUNTER — Telehealth: Payer: Self-pay | Admitting: *Deleted

## 2013-08-23 NOTE — Telephone Encounter (Signed)
Pt called and lvm requesting A1c results. Pt requested results be left on her cell phone vm, [phone]. Called pt and lvm with lab results.

## 2013-11-01 ENCOUNTER — Ambulatory Visit: Payer: Managed Care, Other (non HMO) | Admitting: Internal Medicine

## 2013-11-01 ENCOUNTER — Other Ambulatory Visit: Payer: Self-pay

## 2013-11-01 MED ORDER — LEVOTHYROXINE SODIUM 150 MCG PO TABS: 150.0000 ug | ORAL_TABLET | Freq: Every day | ORAL | Status: DC

## 2013-11-01 NOTE — Telephone Encounter (Signed)
Pt request refill levothyroxine to [facility] [address]. Pt said has appt with Dr Cruzita Lederer end of April but Dr Cruzita Lederer does not manage thyroid problem and pt is going to [place] early AM tomorrow and pt is out of med. Will refill # 30 and pt will cb to schedule f/u appt with Dr Glori Bickers.

## 2013-11-20 ENCOUNTER — Ambulatory Visit (INDEPENDENT_AMBULATORY_CARE_PROVIDER_SITE_OTHER): Payer: Managed Care, Other (non HMO) | Admitting: Internal Medicine

## 2013-11-20 ENCOUNTER — Encounter: Payer: Self-pay | Admitting: Internal Medicine

## 2013-11-20 VITALS — BP 112/76 | HR 88 | Temp 98.1°F | Resp 12 | Wt 227.0 lb

## 2013-11-20 DIAGNOSIS — E039 Hypothyroidism, unspecified: Secondary | ICD-10-CM

## 2013-11-20 DIAGNOSIS — E119 Type 2 diabetes mellitus without complications: Secondary | ICD-10-CM

## 2013-11-20 LAB — COMPREHENSIVE METABOLIC PANEL
ALT: 103 U/L — ABNORMAL HIGH (ref 0–35)
AST: 90 U/L — AB (ref 0–37)
Albumin: 3.8 g/dL (ref 3.5–5.2)
Alkaline Phosphatase: 107 U/L (ref 39–117)
BUN: 19 mg/dL (ref 6–23)
CALCIUM: 9.3 mg/dL (ref 8.4–10.5)
CHLORIDE: 103 meq/L (ref 96–112)
CO2: 26 mEq/L (ref 19–32)
CREATININE: 0.8 mg/dL (ref 0.4–1.2)
GFR: 82.08 mL/min (ref >60.00)
Glucose, Bld: 186 mg/dL — ABNORMAL HIGH (ref 70–99)
Potassium: 4.4 mEq/L (ref 3.5–5.1)
Sodium: 136 mEq/L (ref 135–145)
Total Bilirubin: 0.7 mg/dL (ref 0.3–1.2)
Total Protein: 7 g/dL (ref 6.0–8.3)

## 2013-11-20 LAB — T4, FREE: FREE T4: 3.03 ng/dL — AB (ref 0.60–1.60)

## 2013-11-20 LAB — TSH: TSH: 0.21 u[IU]/mL — AB (ref 0.35–5.50)

## 2013-11-20 LAB — HEMOGLOBIN A1C: HEMOGLOBIN A1C: 8.1 % — AB (ref 4.6–6.5)

## 2013-11-20 MED ORDER — LEVOTHYROXINE SODIUM 125 MCG PO TABS: 125.0000 ug | ORAL_TABLET | Freq: Every day | ORAL | Status: DC

## 2013-11-20 MED ORDER — NONFORMULARY OR COMPOUNDED ITEM: Status: DC

## 2013-11-20 MED ORDER — GLUCOSE BLOOD VI STRP: ORAL_STRIP | Status: DC

## 2013-11-20 NOTE — Patient Instructions (Signed)
Please continue Onglyza 5 mg in am. Continue Invokana 300 mg daily in am. Start Metformin compounded cream 0.5 mL (84% cream) - 2 clicks 2x a day (rub it on wrists) - mail order pharmacy: [facility] or local pharmacy.  Please stop at the lab.  Please return in 1.5 month with your sugar log.

## 2013-11-20 NOTE — Progress Notes (Signed)
Patient ID: Sarah Chaney, female   DOB: 1961/01/31, 53 y.o.   MRN: [id number]  HPI: Sarah Chaney is a 53 y.o.-year-old female, returning for f/u for DM2, dx 2012, non-insulin-dependent, uncontrolled, without complications (? Peripheral neuropathy) and hypothyroidism. Last visit 3 mo ago.  DM2:  Last hemoglobin A1c was: Lab Results  Component Value Date   HGBA1C 8.2* 08/21/2013   HGBA1C 8.2* 03/21/2013   HGBA1C 7.7* 10/03/2012   Pt is on a regimen of: - Onglyza 5 mg  - Invokana 100 mg >> 300 mg Not on Metformin b/c GI upset (AP, not necessarily diarrhea). We stopped Glipizide XL 5 mg and increased Invokana in 07/2013 as she had SEs from Glipizide >> hypoglycemic sxs >> was taken it sporadically She tolerates well the Invokana, with only some vaginal itching.  Pt checks her sugars 0-2x a day: - am: 150-180 >> 138-156 >> 150s >> 137, 160 - before lunch: 122-150 >> n/c >> 122, 144 - 2h after lunch: 200s >> 149, one value >> 185s >> 141-216 - before dinner: 138-150 >> n/c >> 136-141 - bedtime: low 200s-250s >> 125-141 >> 160-170 >> 151 No lows. Lowest sugar was 101 ; she has hypoglycemia awareness at 100. Highest sugar was 216.  She started to go to the gym 3x a week.  - no CKD, last BUN/creatinine:  Lab Results  Component Value Date   BUN 20 10/03/2012   CREATININE 0.9 10/03/2012  On Lisinopril. - last set of lipids: Lab Results  Component Value Date   CHOL 200 03/21/2013   HDL 44.80 03/21/2013   LDLCALC 138* 03/21/2013   LDLDIRECT 138.8 06/29/2011   TRIG 86.0 03/21/2013   CHOLHDL 4 03/21/2013  Not on a statin b/c leg pain. - last eye exam was in 02/2013. No DR.  - no numbness and tingling in her feet (only 1 toe >> post surgery on foot). Foot exam performed in 04/2013.  Pt would also like me to start managing her hypothyroidism. She takes Levothyroxine: - fasting, in am - with water - along with all her other meds, including PPI!  Reviewed TFTs; Lab Results  Component Value  Date   TSH 0.73 03/21/2013   TSH 0.32* 10/03/2012   TSH 7.17* 05/02/2012   TSH 2.92 06/29/2011   TSH 4.04 05/03/2011   FREET4 1.00 05/03/2011   FREET4 1.4 11/10/2009    ROS: Constitutional: no weight gain/loss/no fatigue Eyes: no blurry vision, no xerophthalmia ENT:+ sore throat, no nodules palpated in throat, no dysphagia/odynophagia, + hoarseness Cardiovascular: no CP/SOB/ palpitations/no leg swelling Respiratory: no cough/SOB Gastrointestinal: + N/no V/D/C/no heartburn Musculoskeletal: + muscle/no  joint aches Skin: no rashes Neurological: no tremors/numbness/tingling/dizziness; + HA  I reviewed pt's medications, allergies, PMH, social hx, family hx and no changes required, except as mentioned above.  PE: BP 112/76  Pulse 88  Temp(Src) 98.1 F (36.7 C) (Oral)  Resp 12  Wt 227 lb (102.967 kg)  SpO2 97% Wt Readings from Last 3 Encounters:  11/20/13 227 lb (102.967 kg)  08/21/13 232 lb 12.8 oz (105.597 kg)  06/19/13 225 lb (102.059 kg)  Constitutional: overweight, in NAD Eyes: PERRLA, EOMI, no exophthalmos ENT: moist mucous membranes, no thyromegaly, no cervical lymphadenopathy Cardiovascular: RRR, No MRG Respiratory: CTA B Gastrointestinal: abdomen soft, NT, ND, BS+ Musculoskeletal: no deformities, strength intact in all 4 Skin: moist, warm, no rashes  ASSESSMENT: 1. DM2, non-insulin-dependent, uncontrolled, without complications  2. Hypothyroidism  PLAN:  1. Patient with long-standing,with much improved control after  addition of Invokana. -  I suggested to:  Patient Instructions  Please continue Onglyza 5 mg in am. Continue Invokana 300 mg daily in am. Start Metformin compounded cream 0.5 mL (91% cream) - 2 clicks 2x a day (rub it on wrists) - mail order pharmacy: [facility] or local pharmacy ([place]). Please stop at the lab. Please return in 1.5 month with your sugar log.  - check Hb A1c today and also K since on Invokana and ACEI; will add LFTs per her  request - refilled her test strips - Return to clinic in 3 months with sugar log   2. Hypothyroidism - advised pt to take Levothyroxine EVERY DAY, with water, >30 min before b'fast, separated by >4h from anti acid medication, calcium, iron, MVI. - check TSH and fT4 - needs refill when labs are back  Office Visit on 11/20/2013  Component Date Value Ref Range Status  . Sodium 11/20/2013 136  135 - 145 mEq/L Final  . Potassium 11/20/2013 4.4  3.5 - 5.1 mEq/L Final  . Chloride 11/20/2013 103  96 - 112 mEq/L Final  . CO2 11/20/2013 26  19 - 32 mEq/L Final  . Glucose, Bld 11/20/2013 186* 70 - 99 mg/dL Final  . BUN 11/20/2013 19  6 - 23 mg/dL Final  . Creatinine, Ser 11/20/2013 0.8  0.4 - 1.2 mg/dL Final  . Total Bilirubin 11/20/2013 0.7  0.3 - 1.2 mg/dL Final  . Alkaline Phosphatase 11/20/2013 107  39 - 117 U/L Final  . AST 11/20/2013 90* 0 - 37 U/L Final  . ALT 11/20/2013 103* 0 - 35 U/L Final  . Total Protein 11/20/2013 7.0  6.0 - 8.3 g/dL Final  . Albumin 11/20/2013 3.8  3.5 - 5.2 g/dL Final  . Calcium 11/20/2013 9.3  8.4 - 10.5 mg/dL Final  . GFR 11/20/2013 82.08  >60.00 mL/min Final  . TSH 11/20/2013 0.21* 0.35 - 5.50 uIU/mL Final  . Free T4 11/20/2013 3.03* 0.60 - 1.60 ng/dL Final  . Hemoglobin A1C 11/20/2013 8.1* 4.6 - 6.5 % Final   Glycemic Control Guidelines for People with Diabetes:Non Diabetic:  <6%Goal of Therapy: <7%Additional Action Suggested:  >8%    AST/ALT stable. Glu high. HbA1c only a little better. TSH low >> will decrease LT4 from 150 to 125 >> will recheck TFTs when she comes back.

## 2013-11-21 ENCOUNTER — Telehealth: Payer: Self-pay | Admitting: *Deleted

## 2013-11-21 ENCOUNTER — Other Ambulatory Visit: Payer: Self-pay | Admitting: *Deleted

## 2013-11-21 MED ORDER — GLIPIZIDE ER 2.5 MG PO TB24: 2.5000 mg | ORAL_TABLET | Freq: Every day | ORAL | Status: DC

## 2013-11-21 NOTE — Telephone Encounter (Signed)
Pt called stating that her insurance does not cover the metformin compound ($60). Pt did not pick up medication. Please advise.

## 2013-11-21 NOTE — Telephone Encounter (Signed)
Called pt and lvm advising Dr Cruzita Lederer said to restart Glipizide XL, low dose 2.5 mg in the am, qty#60, 3 rf.

## 2013-11-21 NOTE — Telephone Encounter (Signed)
Sarah Chaney, let's restart Glipizide XL, but at a low dose of 2.5 mg daily in am >> please send 60 with 3 refills.

## 2013-11-29 ENCOUNTER — Other Ambulatory Visit: Payer: Self-pay | Admitting: *Deleted

## 2013-11-29 ENCOUNTER — Other Ambulatory Visit: Payer: Self-pay | Admitting: Family Medicine

## 2013-11-29 MED ORDER — SAXAGLIPTIN HCL 5 MG PO TABS: 5.0000 mg | ORAL_TABLET | Freq: Every day | ORAL | Status: DC

## 2013-12-13 ENCOUNTER — Other Ambulatory Visit: Payer: Self-pay | Admitting: Family Medicine

## 2013-12-13 NOTE — Telephone Encounter (Signed)
Rx called in as prescribed 

## 2013-12-13 NOTE — Telephone Encounter (Signed)
Electronic refill request, please advise  

## 2013-12-13 NOTE — Telephone Encounter (Signed)
Px written for call in   

## 2013-12-17 ENCOUNTER — Encounter: Payer: Self-pay | Admitting: Family Medicine

## 2013-12-17 ENCOUNTER — Ambulatory Visit (INDEPENDENT_AMBULATORY_CARE_PROVIDER_SITE_OTHER)
Admission: RE | Admit: 2013-12-17 | Discharge: 2013-12-17 | Disposition: A | Discharge status: Home / Self Care | Payer: Managed Care, Other (non HMO) | Source: Ambulatory Visit | Attending: Family Medicine | Admitting: Family Medicine

## 2013-12-17 ENCOUNTER — Ambulatory Visit (INDEPENDENT_AMBULATORY_CARE_PROVIDER_SITE_OTHER): Payer: Managed Care, Other (non HMO) | Admitting: Family Medicine

## 2013-12-17 VITALS — BP 142/84 | HR 83 | Temp 98.0°F | Wt 230.0 lb

## 2013-12-17 DIAGNOSIS — M25579 Pain in unspecified ankle and joints of unspecified foot: Secondary | ICD-10-CM

## 2013-12-17 NOTE — Patient Instructions (Signed)
I didn't see a fracture. I would use your CAM boot during the day and ice your foot down at night.   Gradually wean out of the boot. This looks like a tendon strain.  Take care.

## 2013-12-17 NOTE — Progress Notes (Signed)
Pre visit review using our clinic review tool, if applicable. No additional management support is needed unless otherwise documented below in the visit note.  R foot sx.  Slipped and landed "funny" two nights ago.  Able to bear weight at the time.  Not very painful at the time.  More pain yesterday AM, on the dorsum of foot, not like prev plantar fascia pain.  She didn't recall rolling the foot "it happened so quickly."   Using a compression sleeve didn't help.  Can bear weight with pain now.  No L foot pain.  No knee pain.  No foot bruising but top of foot is a little puffy.    Meds, vitals, and allergies reviewed.   ROS: See HPI.  Otherwise, noncontributory.  nad R foot with normal inspection except for minimally puffy on the dorsum of the foot.  Distal 3rd 4th MT ttp on the dorsum, not ttp plantar side 5th not ttp Distally nv intact, normal DP pulse but pain on resisted dorsiflexion Pain with gait but can bear weight.  Pain not improved with walking in post op shoe.   xrays reviewed.

## 2013-12-18 DIAGNOSIS — M25579 Pain in unspecified ankle and joints of unspecified foot: Secondary | ICD-10-CM | POA: Insufficient documentation

## 2013-12-18 NOTE — Assessment & Plan Note (Signed)
Likely with tendon strain, not a failure.  No fx seen.  She has a cam walker at home.  Would use that in the meantime as the post op shoe didn't help.  ASO likely not beneficial as the ankle isn't the issue her.  Gradually wean out of cam and ice foot in meantime.  Fu prn.  She agrees.  Routed to PCP as FYI.

## 2013-12-20 ENCOUNTER — Ambulatory Visit: Payer: Self-pay | Admitting: Gastroenterology

## 2013-12-31 ENCOUNTER — Encounter: Payer: Self-pay | Admitting: Internal Medicine

## 2013-12-31 ENCOUNTER — Ambulatory Visit (INDEPENDENT_AMBULATORY_CARE_PROVIDER_SITE_OTHER): Payer: Managed Care, Other (non HMO) | Admitting: Internal Medicine

## 2013-12-31 VITALS — BP 122/80 | HR 76 | Temp 98.3°F | Resp 12 | Wt 230.8 lb

## 2013-12-31 DIAGNOSIS — E063 Autoimmune thyroiditis: Secondary | ICD-10-CM

## 2013-12-31 DIAGNOSIS — E119 Type 2 diabetes mellitus without complications: Secondary | ICD-10-CM

## 2013-12-31 LAB — TSH: TSH: 57.17 u[IU]/mL — ABNORMAL HIGH (ref 0.35–4.50)

## 2013-12-31 LAB — T4, FREE: Free T4: 1.75 ng/dL — ABNORMAL HIGH (ref 0.60–1.60)

## 2013-12-31 NOTE — Progress Notes (Signed)
Patient ID: Sarah Chaney, female   DOB: 1961-02-02, 53 y.o.   MRN: [id number]  HPI: Sarah Chaney is a 53 y.o.-year-old female, returning for f/u for DM2, dx 2012, non-insulin-dependent, uncontrolled, without complications (? Peripheral neuropathy) and hypothyroidism. Last visit 1.5 mo ago.  DM2:  Last hemoglobin A1c was: Lab Results  Component Value Date   HGBA1C 8.1* 11/20/2013   HGBA1C 8.2* 08/21/2013   HGBA1C 8.2* 03/21/2013   Pt is on a regimen of: - Onglyza 5 mg  - Invokana 100 mg >> 300 mg - Glipizide XL 2.5 mg daily Not on Metformin b/c GI upset (AP, not necessarily diarrhea). We tried to start Compounded Metformin but she could not afford it. We stopped Glipizide XL 5 mg and increased Invokana in 07/2013 as she had SEs from Glipizide >> hypoglycemic sxs >> was taken it sporadically She tolerates well the Invokana, with only some vaginal itching.  Pt checks her sugars 0-2x a day: - am: 150-180 >> 138-156 >> 150s >> 137, 160 >> 132-156 (170 - cereal at 11 pm) - 2h after b'fast: 145 - before lunch: 122-150 >> n/c >> 122, 144 >> 119, 130, 140 - 2h after lunch: 200s >> 149, one value >> 185s >> 141-216 >> 154, 170 - before dinner: 138-150 >> n/c >> 136-141 >> 97-140 - bedtime: low 200s-250s >> 125-141 >> 160-170 >> 151 >> 124, 154 No lows. Lowest sugar was 101 ; she has hypoglycemia awareness at 100. Highest sugar was 216.  She started to go to the gym 3x a week.  - no CKD, last BUN/creatinine:  Lab Results  Component Value Date   BUN 19 11/20/2013   CREATININE 0.8 11/20/2013  On Lisinopril. - she has fatty liver ds with transaminitis. AST/ALT stable per last check: Lab Results  Component Value Date   ALT 103* 11/20/2013   AST 90* 11/20/2013   ALKPHOS 107 11/20/2013   BILITOT 0.7 11/20/2013  - last set of lipids: Lab Results  Component Value Date   CHOL 200 03/21/2013   HDL 44.80 03/21/2013   LDLCALC 138* 03/21/2013   LDLDIRECT 138.8 06/29/2011   TRIG 86.0 03/21/2013   CHOLHDL 4  03/21/2013  Not on a statin b/c leg pain. - last eye exam was in 02/2013. No DR.  - no numbness and tingling in her feet (only 1 toe >> post surgery on foot). Foot exam performed in 04/2013.  Pt would also like me to start managing her hypothyroidism. She takes Levothyroxine: - fasting, in am - with water - she was taking it along with all her other meds, including PPI! Now she takes PPI first thing in am, the LT4 4h later, at least 30 min before lunch.  Reviewed TFTs; Lab Results  Component Value Date   TSH 0.21* 11/20/2013   TSH 0.73 03/21/2013   TSH 0.32* 10/03/2012   TSH 7.17* 05/02/2012   TSH 2.92 06/29/2011   FREET4 3.03* 11/20/2013   FREET4 1.00 05/03/2011   FREET4 1.4 11/10/2009   At last visit, we decrease her LT4 dose from 150 to 125 mcg/day since TSH was low.  ROS: Constitutional: no weight gain/loss/no fatigue Eyes: no blurry vision, no xerophthalmia ENT: no sore throat, no nodules palpated in throat, occasional dysphagia/no odynophagia, + hoarseness, + tinnitus Cardiovascular: no CP/SOB/ palpitations/no leg swelling Respiratory: + cough/no SOB Gastrointestinal: no N/V/D/C/+ heartburn Musculoskeletal: no muscle/joint aches Skin: no rashes Neurological: no tremors/numbness/tingling/dizziness; + HA  I reviewed pt's medications, allergies, PMH, social hx,  family hx and no changes required, except as mentioned above. She had a colonoscopy and endoscopy on 12/20/2013. She has inflammation in the esophagus 2/2 reflux.  PE: BP 122/80  Pulse 76  Temp(Src) 98.3 F (36.8 C) (Oral)  Resp 12  Wt 230 lb 12.8 oz (104.69 kg)  SpO2 76% Wt Readings from Last 3 Encounters:  12/31/13 230 lb 12.8 oz (104.69 kg)  12/17/13 230 lb (104.327 kg)  11/20/13 227 lb (102.967 kg)  Constitutional: overweight, in NAD, tanned Eyes: PERRLA, EOMI, no exophthalmos ENT: dry mucous membranes, no thyromegaly, no cervical lymphadenopathy Cardiovascular: RRR, No MRG Respiratory: CTA  B Gastrointestinal: abdomen soft, NT, ND, BS+ Musculoskeletal: no deformities, strength intact in all 4 Skin: moist, warm, no rashes  ASSESSMENT: 1. DM2, non-insulin-dependent, uncontrolled, without complications  2. Hypothyroidism  PLAN:  1. Patient with long-standing,with much improved control after addition of Invokana and low dose Glipizide XL -  I suggested to:  Patient Instructions  Please stop at the lab. I will send you the results through [facility]. Please continue: - Onglyza 5 mg in am - Invokana 300 mg in am - Glipizide XL 2.5 mg in am Try to limit the snacks at night or to replace with low calorie snacks.  Please return in 2 months with your sugar log.  - We discussed to add a second dose of Glipizide XL if her sugars in am do not improve (since this is related to her snacks at night) - advised to stay hydrated - Return to clinic in 2 months with sugar log   2. Hypothyroidism - she is taking Levothyroxine every day, with water, 4h after her anti acid medication! (previously she was taking them together) - check TSH and fT4 today, after we changed her LT4 dose at last visit  Office Visit on 12/31/2013  Component Date Value Ref Range Status  . TSH 12/31/2013 57.17* 0.35 - 4.50 uIU/mL Final  . Free T4 12/31/2013 1.75* 0.60 - 1.60 ng/dL Final   Changing the order of her medication, with Prilosec taken first and Synthroid 4 hours later, has reduced the absorption of Synthroid dramatically. I would like the patient takes Synthroid first thing in the morning and take the Prilosec as late as possible afterwards.  We did discuss about this at the time of the visit, however she told me that she would need to take Prilosec first thing in the morning because of her severe reflux. I will try to discuss with her to see if we can at least push Prilosec 2 hours after the Synthroid.  We will repeat thyroid tests at next visit in 2 months.

## 2013-12-31 NOTE — Patient Instructions (Signed)
Please stop at the lab. I will send you the results through [facility]. Please continue: - Onglyza 5 mg in am - Invokana 300 mg in am - Glipizide XL 2.5 mg in am Try to limit the snacks at night or to replace with low calorie snacks.   Please return in 2 months with your sugar log.

## 2014-01-23 NOTE — Telephone Encounter (Signed)
This encounter was created in error - please disregard.  This encounter was created in error - please disregard.

## 2014-02-12 ENCOUNTER — Ambulatory Visit (INDEPENDENT_AMBULATORY_CARE_PROVIDER_SITE_OTHER): Payer: Managed Care, Other (non HMO) | Admitting: Family Medicine

## 2014-02-12 ENCOUNTER — Encounter: Payer: Self-pay | Admitting: Family Medicine

## 2014-02-12 VITALS — BP 108/78 | HR 80 | Temp 97.7°F | Wt 231.2 lb

## 2014-02-12 DIAGNOSIS — R42 Dizziness and giddiness: Secondary | ICD-10-CM

## 2014-02-12 NOTE — Progress Notes (Signed)
Pre visit review using our clinic review tool, if applicable. No additional management support is needed unless otherwise documented below in the visit note.  1-2 months of "dizzy sensation".  No syncope.  She has some sensation of room spinning but she is really more lightheaded when it happens. Comes on w/o position change, ie sitting w/o movement.  Can happen when already upright.    She does have migraines with aura.  She has gotten the aura w/o HA recently.  She has aura events twice this week w/o HA.  No trigger known.  Irregular onset/timing of events.  H/o SZ d/o prev.  Off meds for about 40 years w/o a known event.    She if nodding off at work from fatigue.  She has endo f/u re: her thyroid.  Her TSH was abnormal recently.  No h/o known loud snoring.  She doesn't wake gasping for air.   B knee pain; "on fire".  H/o throbbing leg pain at baseline.  Had vascular clinic eval and was told some of her leg pain was from her back.    Meds, vitals, and allergies reviewed.   ROS: See HPI.  Otherwise, noncontributory.  nad ncat Tm wnl Nasal and OP exam wnl Neck supple, no LA, no vertigo sx with head turnring. No bruit rrr ctab abd soft Ext w/o edema Knee burning pain not present at time of exam. CN 2-12 wnl B, S/S/DTR wnl x4

## 2014-02-12 NOTE — Patient Instructions (Signed)
Go to the lab on the [facility] out.  We'll contact you with your lab report. Let me talk to [facility] and we'll go from there.  Take care.

## 2014-02-13 DIAGNOSIS — R42 Dizziness and giddiness: Secondary | ICD-10-CM | POA: Insufficient documentation

## 2014-02-13 LAB — CBC WITH DIFFERENTIAL/PLATELET
Basophils Absolute: 0.1 10*3/uL (ref 0.0–0.1)
Basophils Relative: 1 % (ref 0.0–3.0)
Eosinophils Absolute: 0.3 10*3/uL (ref 0.0–0.7)
Eosinophils Relative: 3.9 % (ref 0.0–5.0)
HCT: 43.1 % (ref 36.0–46.0)
Hemoglobin: 14.1 g/dL (ref 12.0–15.0)
Lymphocytes Relative: 31.8 % (ref 12.0–46.0)
Lymphs Abs: 2.7 10*3/uL (ref 0.7–4.0)
MCHC: 32.8 g/dL (ref 30.0–36.0)
MCV: 82 fl (ref 78.0–100.0)
Monocytes Absolute: 0.8 10*3/uL (ref 0.1–1.0)
Monocytes Relative: 9.8 % (ref 3.0–12.0)
Neutro Abs: 4.6 10*3/uL (ref 1.4–7.7)
Neutrophils Relative %: 53.5 % (ref 43.0–77.0)
Platelets: 192 10*3/uL (ref 150.0–400.0)
RBC: 5.26 Mil/uL — ABNORMAL HIGH (ref 3.87–5.11)
RDW: 15.2 % (ref 11.5–15.5)
WBC: 8.6 10*3/uL (ref 4.0–10.5)

## 2014-02-13 NOTE — Assessment & Plan Note (Signed)
W/o orthostatic sx.  Check CBC today.  Unclear if related to thyroid abnormality, TSH f/u per endo.  She doesn't appear dehydrated and this doesn't appear to be volume related or true vertigo either.  She isn't having orthostatic sx.  I am not sure if this is related to atypical presentation of migraines vs SZ.  It doesn't appear to be typical SZ activity at all. Her knee pain is not going on currently, so that was tabled for now.  Will check CBC and d/w PCP. We'll go from there.   Routed to PCP and endo as FYI.

## 2014-02-19 ENCOUNTER — Telehealth: Payer: Self-pay | Admitting: *Deleted

## 2014-02-19 MED ORDER — SUMATRIPTAN SUCCINATE 50 MG PO TABS: 50.0000 mg | ORAL_TABLET | Freq: Once | ORAL | Status: DC

## 2014-02-19 NOTE — Telephone Encounter (Signed)
Per records, she was called prev.  Will route to PCP as FYI.  Thanks.

## 2014-02-19 NOTE — Telephone Encounter (Signed)
Pt notified of Dr. Marliss Coots comments/recommendations. Pt advise that Rx sent to pharmacy and to update [place] on how she is feeling.  Pt did want me to ask Dr. Glori Bickers a question. Pt said she thought Dr. Damita Dunnings was going to discuss getting a CT scan on pt due to her increasing dizziness. Pt said that the dizziness has gotten worse over time and she is having more dizzy spells and the dizziness is not always when she is having a HA, but pt is concerned because she has been having more spells of dizziness and now she started having HA, so pt wanted to know if you recommend her getting a CT scan. Pt said she didn't need a call back, you could just send her a mychart message with your recommendations

## 2014-02-19 NOTE — Telephone Encounter (Signed)
Pt said that her dizziness and migraines didn't worsen when she started onglyza. Pt said that she has tried topamax in the past and it was to strong for her, even the low dose. Pt said she has tried Imitrex (?low dose) in the past and that seem to help her when she had HA, pt said that Dr. Cruzita Lederer told her she can't take prednisone at all because it makes her blood sugar go to high. Pt said she is willing to try imitrex again but she wanted to know if that would help the bouts of dizziness she has been having or would she need another medication for that

## 2014-02-19 NOTE — Telephone Encounter (Signed)
Lets try low dose imitrex and see how that does -if the migraine is causing the dizziness -that would take care of it  However - phenergan (or even dramamine otc) may also help in addition to the imitrex for any dizziness or nausea  Please call in imitrex  She can try dramamine otc if she wants to  Update me with how that works out

## 2014-02-19 NOTE — Telephone Encounter (Signed)
I did try to get a hold of her -? What happened so I'm glad she called  How bad is her headache? - is it one sided or both sided and is it throbbing? -- I am wondering if this is migraine related  I would like to try some phenergan to see if this would help both the dizziness and headache  Let me know what she thinks

## 2014-02-19 NOTE — Telephone Encounter (Signed)
Patient called and saw Dr. Damita Dunnings last week for dizziness. She had labs which were normal. She was told he was going to speak with Dr. Glori Bickers about what to do next. It has been a week-she is still dizzy and now having daily HA's. She hasn't heard anything as to what to do. She wants a call back today. [phone]

## 2014-02-19 NOTE — Telephone Encounter (Signed)
Pt said HA are really bad she has been having them for days. Pt said head hurts all over (not just on one side), pt said that it feels like a lot of pressure in he head when she has the HA, pt is also still having dizzy spells. Pt said she doesn't want to try phenergan because that is to strong for her. Pt said "it wipes me out all day" so she would like to try something else, please advise

## 2014-02-19 NOTE — Telephone Encounter (Signed)
onglyza has a 7% headache side effect- have her headaches been worse since starting it?  How did she do with topamax in the past ? I know she has tried different things for headaches in the past - a prednisone taper will sometimes get a cycle under control - would she be open to trying it?

## 2014-02-20 ENCOUNTER — Telehealth: Payer: Self-pay | Admitting: Family Medicine

## 2014-02-20 ENCOUNTER — Encounter: Payer: Self-pay | Admitting: Family Medicine

## 2014-02-20 DIAGNOSIS — R51 Headache: Secondary | ICD-10-CM

## 2014-02-20 DIAGNOSIS — R42 Dizziness and giddiness: Secondary | ICD-10-CM

## 2014-02-20 DIAGNOSIS — R519 Headache, unspecified: Secondary | ICD-10-CM | POA: Insufficient documentation

## 2014-02-20 NOTE — Telephone Encounter (Signed)
Ref for CT for headache

## 2014-02-20 NOTE — Telephone Encounter (Signed)
I sent a my chart message.

## 2014-02-23 ENCOUNTER — Emergency Department: Payer: Self-pay | Admitting: Emergency Medicine

## 2014-02-26 ENCOUNTER — Telehealth: Payer: Self-pay

## 2014-02-26 NOTE — Telephone Encounter (Signed)
PA done over phone, Approved for 12 months pt can get a max of 18 tabs per month and conf # [id number], pharmacy notified and confirmed Rx was approved, and left voicemail letting pt know Rx was approved

## 2014-02-26 NOTE — Telephone Encounter (Signed)
Pt left v/m requesting status of prior auth for imitrex; pharmacy told pt prior auth request has been sent twice and has not gotten response. Pt request cb today.

## 2014-02-28 ENCOUNTER — Ambulatory Visit: Payer: Self-pay | Admitting: Family Medicine

## 2014-03-03 ENCOUNTER — Encounter: Payer: Self-pay | Admitting: Family Medicine

## 2014-03-05 ENCOUNTER — Ambulatory Visit: Payer: Managed Care, Other (non HMO) | Admitting: Internal Medicine

## 2014-03-21 ENCOUNTER — Ambulatory Visit (INDEPENDENT_AMBULATORY_CARE_PROVIDER_SITE_OTHER): Payer: Managed Care, Other (non HMO) | Admitting: Internal Medicine

## 2014-03-21 ENCOUNTER — Telehealth: Payer: Self-pay | Admitting: Internal Medicine

## 2014-03-21 ENCOUNTER — Encounter: Payer: Self-pay | Admitting: Internal Medicine

## 2014-03-21 ENCOUNTER — Other Ambulatory Visit: Payer: Self-pay | Admitting: *Deleted

## 2014-03-21 VITALS — BP 118/64 | HR 89 | Temp 97.8°F | Resp 12 | Wt 231.0 lb

## 2014-03-21 DIAGNOSIS — E119 Type 2 diabetes mellitus without complications: Secondary | ICD-10-CM

## 2014-03-21 LAB — T4, FREE: Free T4: 1.25 ng/dL (ref 0.60–1.60)

## 2014-03-21 LAB — TSH: TSH: 1.41 u[IU]/mL (ref 0.35–4.50)

## 2014-03-21 LAB — HEMOGLOBIN A1C: Hgb A1c MFr Bld: 8.2 % — ABNORMAL HIGH (ref 4.6–6.5)

## 2014-03-21 MED ORDER — EXENATIDE ER 2 MG ~~LOC~~ PEN: PEN_INJECTOR | SUBCUTANEOUS | Status: DC

## 2014-03-21 MED ORDER — DULAGLUTIDE 1.5 MG/0.5ML ~~LOC~~ SOAJ: SUBCUTANEOUS | Status: DC

## 2014-03-21 NOTE — Patient Instructions (Signed)
Please stop Onglyza. Continue Invokana 300 mg in am. Increase Glipizide XL to 2.5 mg 2x a day. Start Trulicity   Please stop at the lab.

## 2014-03-21 NOTE — Telephone Encounter (Signed)
The new med for her DM requires PA paperwork coming

## 2014-03-21 NOTE — Progress Notes (Signed)
Patient ID: Sarah Chaney, female   DOB: [birth year], 53 y.o.   MRN: [id number]  HPI: Sarah Chaney is a 53 y.o.-year-old female, returning for f/u for DM2, dx 2012, non-insulin-dependent, uncontrolled, without complications (? Peripheral neuropathy) and hypothyroidism. Last visit 2.5 mo ago.  DM2:  Last hemoglobin A1c was: Lab Results  Component Value Date   HGBA1C 8.1* 11/20/2013   HGBA1C 8.2* 08/21/2013   HGBA1C 8.2* 03/21/2013   Pt is on a regimen of: - Onglyza 5 mg  - Invokana 100 mg >> 300 mg - Glipizide XL 2.5 mg daily + adds another dose 4/7 days >> sugars better with this Not on Metformin b/c GI upset (AP, not necessarily diarrhea). We tried to start Compounded Metformin but she could not afford it. We stopped Glipizide XL 5 mg and increased Invokana in 07/2013 as she had SEs from Glipizide >> hypoglycemic sxs >> was taken it sporadically She tolerates well the Invokana, with only some vaginal itching.  Pt checks her sugars 0-2x a day: - am: 150-180 >> 138-156 >> 150s >> 137, 160 >> 132-156 (170 - cereal at 11 pm) >> 123-170s, 182 - 2h after b'fast: 145 >> n/c - before lunch: 122-150 >> n/c >> 122, 144 >> 119, 130, 140 >> 125-180 - 2h after lunch: 200s >> 149, one value >> 185s >> 141-216 >> 154, 170 >> 133, 159 - before dinner: 138-150 >> n/c >> 136-141 >> 97-140 >> 175 - bedtime: low 200s-250s >> 125-141 >> 160-170 >> 151 >> 124, 154 >> n/c No lows. Lowest sugar was 123 ; she has hypoglycemia awareness at 100. Highest sugar was 182  She started to go to the gym 3x a week.  - no CKD, last BUN/creatinine:  Lab Results  Component Value Date   BUN 19 11/20/2013   CREATININE 0.8 11/20/2013  On Lisinopril. - she has fatty liver ds with transaminitis. AST/ALT stable per last check: Lab Results  Component Value Date   ALT 103* 11/20/2013   AST 90* 11/20/2013   ALKPHOS 107 11/20/2013   BILITOT 0.7 11/20/2013  - last set of lipids: Lab Results  Component Value Date   CHOL 200  03/21/2013   HDL 44.80 03/21/2013   LDLCALC 138* 03/21/2013   LDLDIRECT 138.8 06/29/2011   TRIG 86.0 03/21/2013   CHOLHDL 4 03/21/2013  Not on a statin b/c leg pain. - last eye exam was in 02/2013. No DR.  - no numbness and tingling in her feet (only 1 toe >> post surgery on foot). Foot exam performed in 04/2013.  Hypothyroidism. She takes Levothyroxine: - fasting, in am - with water - she was taking it along with all her other meds, including PPI! Then she took PPI first thing in am, the LT4 4h later, at least 30 min before lunch. At last visit, we switched the order of these 2.  Reviewed TFTs; Lab Results  Component Value Date   TSH 57.17* 12/31/2013   TSH 0.21* 11/20/2013   TSH 0.73 03/21/2013   TSH 0.32* 10/03/2012   TSH 7.17* 05/02/2012   FREET4 1.75* 12/31/2013   FREET4 3.03* 11/20/2013   FREET4 1.00 05/03/2011   FREET4 1.4 11/10/2009   We decrease her LT4 dose from 150 to 125 mcg/day. At last visit, we did not change the dose as the most likely cause for her elevated TSH was the fact that she was taking PPI first, and Synthroid 4h later!  ROS: Constitutional: no weight gain/loss/+ fatigue/+ hot flushes  Eyes: no blurry vision, no xerophthalmia ENT: no sore throat, no nodules palpated in throat, occasional dysphagia/no odynophagia, + hoarseness, + tinnitus Cardiovascular: no CP/SOB/ palpitations/no leg swelling Respiratory: no cough/no SOB Gastrointestinal: no N/V/D/C/heartburn Musculoskeletal: + both: muscle/joint aches Skin: no rashes Neurological: no tremors/numbness/tingling/+dizziness; + HA  I reviewed pt's medications, allergies, PMH, social hx, family hx and no changes required, except as mentioned above. She had a colonoscopy and endoscopy on 12/20/2013. She has inflammation in the esophagus 2/2 reflux.  PE: BP 118/64  Pulse 89  Temp(Src) 97.8 F (36.6 C) (Oral)  Resp 12  Wt 231 lb (104.781 kg)  SpO2 96% Wt Readings from Last 3 Encounters:  03/21/14 231 lb (104.781  kg)  02/12/14 231 lb 4 oz (104.894 kg)  12/31/13 230 lb 12.8 oz (104.69 kg)  Constitutional: overweight, in NAD, tanned Eyes: PERRLA, EOMI, no exophthalmos ENT: dry mucous membranes, no thyromegaly, no cervical lymphadenopathy Cardiovascular: RRR, No MRG Respiratory: CTA B Gastrointestinal: abdomen soft, NT, ND, BS+ Musculoskeletal: no deformities, strength intact in all 4 Skin: moist, warm, no rashes  ASSESSMENT: 1. DM2, non-insulin-dependent, uncontrolled, without complications  2. Hypothyroidism  PLAN:  1. Patient with long-standing,with a little worse control lately. I advised her to start a GLP1 R agonist and increase Glipizide: -  I suggested to:  Patient Instructions  Please stop Onglyza. Continue Invokana 300 mg in am. Increase Glipizide XL to 2.5 mg 2x a day. Start Trulicity once a week. Please stop at the lab. - advised to stay hydrated - Return to clinic in 2 months with sugar log   2. Hypothyroidism - she is taking Levothyroxine every day, with water, now 4h before her anti acid medication! (previously she was taking then in reversed order) - check TSH and fT4 today  Office Visit on 03/21/2014  Component Date Value Ref Range Status  . Hemoglobin A1C 03/21/2014 8.2* 4.6 - 6.5 % Final   Glycemic Control Guidelines for People with Diabetes:Non Diabetic:  <6%Goal of Therapy: <7%Additional Action Suggested:  >8%   . TSH 03/21/2014 1.41  0.35 - 4.50 uIU/mL Final  . Free T4 03/21/2014 1.25  0.60 - 1.60 ng/dL Final   TFTs now excellent, after starting to take the Levothyroxine correctly. HbA1c same as before. See plan above.

## 2014-03-24 NOTE — Telephone Encounter (Signed)
This encounter was created in error - please disregard.  This encounter was created in error - please disregard.

## 2014-03-27 ENCOUNTER — Ambulatory Visit: Payer: Managed Care, Other (non HMO) | Admitting: Internal Medicine

## 2014-04-17 ENCOUNTER — Other Ambulatory Visit: Payer: Self-pay | Admitting: Family Medicine

## 2014-04-17 NOTE — Telephone Encounter (Signed)
Last filled 12/13/13

## 2014-04-17 NOTE — Telephone Encounter (Signed)
Rx called in as prescribed 

## 2014-04-17 NOTE — Telephone Encounter (Signed)
Px written for call in   

## 2014-04-25 DIAGNOSIS — R42 Dizziness and giddiness: Secondary | ICD-10-CM | POA: Insufficient documentation

## 2014-04-25 NOTE — Addendum Note (Signed)
Addended by: Loura Pardon A on: 04/25/2014 09:41 AM   Modules accepted: Orders

## 2014-04-28 NOTE — Addendum Note (Signed)
Addended by: Loura Pardon A on: 04/28/2014 01:55 PM   Modules accepted: Orders

## 2014-05-07 ENCOUNTER — Ambulatory Visit (INDEPENDENT_AMBULATORY_CARE_PROVIDER_SITE_OTHER): Payer: Managed Care, Other (non HMO) | Admitting: Internal Medicine

## 2014-05-07 ENCOUNTER — Encounter: Payer: Self-pay | Admitting: Internal Medicine

## 2014-05-07 VITALS — BP 122/80 | HR 78 | Temp 97.8°F | Resp 12 | Wt 225.0 lb

## 2014-05-07 DIAGNOSIS — E119 Type 2 diabetes mellitus without complications: Secondary | ICD-10-CM

## 2014-05-07 DIAGNOSIS — E063 Autoimmune thyroiditis: Secondary | ICD-10-CM

## 2014-05-07 MED ORDER — EXENATIDE ER 2 MG ~~LOC~~ PEN: PEN_INJECTOR | SUBCUTANEOUS | Status: DC

## 2014-05-07 NOTE — Progress Notes (Signed)
Patient ID: Sarah Chaney, female   DOB: 08-27-1960, 53 y.o.   MRN: [id number]  HPI: Sarah Chaney is a 53 y.o.-year-old female, returning for f/u for DM2, dx 2012, non-insulin-dependent, uncontrolled, without complications (? Peripheral neuropathy) and hypothyroidism. Last visit 2.5 mo ago.  DM2:  Last hemoglobin A1c was: Lab Results  Component Value Date   HGBA1C 8.2* 03/21/2014   HGBA1C 8.1* 11/20/2013   HGBA1C 8.2* 08/21/2013   Pt is on a regimen of: - Bydureon 2 mg weekly - started 1.5 mo ago - Invokana 300 mg daily Stopped GlipizideGlipizide XL 2.5 mg 2x a day >>  Not on Metformin b/c GI upset (AP, not necessarily diarrhea). We tried to start Compounded Metformin but she could not afford it. We stopped Glipizide XL 5 mg and increased Invokana in 07/2013 as she had SEs from Glipizide >> hypoglycemic sxs >> was taken it sporadically She tolerates well the Invokana.  Pt checks her sugars 0-2x a day  - much better!: - am: 150-180 >> 138-156 >> 150s >> 137, 160 >> 132-156 (170 - cereal at 11 pm) >> 123-170s, 182 >> 129-141 - 2h after b'fast: 145 >> n/c >> 135 - before lunch: 122-150 >> n/c >> 122, 144 >> 119, 130, 140 >> 125-180 >> 106, 115, 124 - 2h after lunch: 200s >> 149, one value >> 185s >> 141-216 >> 154, 170 >> 133, 159 >> 133, 136, 147 - before dinner: 138-150 >> n/c >> 136-141 >> 97-140 >> 175 >> 102, 113 - bedtime: low 200s-250s >> 125-141 >> 160-170 >> 151 >> 124, 154 >> n/c >> 147 No lows. Lowest sugar was 123 ; she has hypoglycemia awareness at 100. Highest sugar was 182  She started to go to the gym 3x a week.  - no CKD, last BUN/creatinine:  Lab Results  Component Value Date   BUN 19 11/20/2013   CREATININE 0.8 11/20/2013  On Lisinopril. - she has fatty liver ds with transaminitis. AST/ALT stable per last check: Lab Results  Component Value Date   ALT 103* 11/20/2013   AST 90* 11/20/2013   ALKPHOS 107 11/20/2013   BILITOT 0.7 11/20/2013  - last set of lipids: Lab  Results  Component Value Date   CHOL 200 03/21/2013   HDL 44.80 03/21/2013   LDLCALC 138* 03/21/2013   LDLDIRECT 138.8 06/29/2011   TRIG 86.0 03/21/2013   CHOLHDL 4 03/21/2013  Not on a statin b/c leg pain. - last eye exam was in 02/2014. No DR.  - no numbness and tingling in her feet (only 1 toe >> post surgery on foot). Foot exam performed in 04/2013.  Hypothyroidism. She takes Levothyroxine: - fasting, in am - with water - she was taking it along with all her other meds, including PPI!  Then she took PPI first thing in am, the LT4 4h later, at least 30 min before lunch.  She now takes it correctly  We decreased her LT4 dose from 150 to 125 mcg/day. Reviewed TFTs >> normal at last check: Lab Results  Component Value Date   TSH 1.41 03/21/2014   TSH 57.17* 12/31/2013   TSH 0.21* 11/20/2013   TSH 0.73 03/21/2013   TSH 0.32* 10/03/2012   FREET4 1.25 03/21/2014   FREET4 1.75* 12/31/2013   FREET4 3.03* 11/20/2013   FREET4 1.00 05/03/2011   FREET4 1.4 11/10/2009   ROS: Constitutional: + weight loss/fatigue/hot flushes Eyes: no blurry vision, no xerophthalmia ENT: no sore throat, no nodules palpated in throat,  occasional dysphagia/no odynophagia Cardiovascular: no CP/SOB/ palpitations/no leg swelling Respiratory: no cough/no SOB Gastrointestinal: no N/V/D/C/heartburn Musculoskeletal: no muscle/joint aches Skin: no rashes Neurological: no tremors/numbness/tingling/dizziness  I reviewed pt's medications, allergies, PMH, social hx, family hx and no changes required, except as mentioned above.   PE: BP 122/80  Pulse 78  Temp(Src) 97.8 F (36.6 C) (Oral)  Resp 12  Wt 225 lb (102.059 kg)  SpO2 95% Wt Readings from Last 3 Encounters:  05/07/14 225 lb (102.059 kg)  03/21/14 231 lb (104.781 kg)  02/12/14 231 lb 4 oz (104.894 kg)  Constitutional: overweight, in NAD, tanned Eyes: PERRLA, EOMI, no exophthalmos ENT: dry mucous membranes, no thyromegaly, no cervical  lymphadenopathy Cardiovascular: RRR, No MRG Respiratory: CTA B Gastrointestinal: abdomen soft, NT, ND, BS+ Musculoskeletal: no deformities, strength intact in all 4 Skin: moist, warm, no rashes  ASSESSMENT: 1. DM2, non-insulin-dependent, uncontrolled, without complications  2. Hypothyroidism  PLAN:  1. Patient with long-standing,with great control lately. We started a GLP1 R agonist >> sugars greatly improved so she stopped the glipizide -  I suggested to:  Patient Instructions  - Continue Bydureon 2 mg weekly - Continue Invokana 300 mg daily Stay off Glipizide.  Please return in 3 months with your sugar log.   - advised to stay hydrated - Return to clinic in 3 months with sugar log   2. Hypothyroidism - she is taking Levothyroxine correctly - reviewed TSH and fT4 >> recheck them at next visit

## 2014-05-07 NOTE — Patient Instructions (Signed)
-   Continue Bydureon 2 mg weekly - Continue Invokana 300 mg daily Stay off Glipizide.  Please return in 3 months with your sugar log.

## 2014-05-15 NOTE — Telephone Encounter (Signed)
error 

## 2014-06-09 ENCOUNTER — Telehealth: Payer: Self-pay | Admitting: *Deleted

## 2014-06-09 NOTE — Telephone Encounter (Signed)
We can send Trulicity when she is ready to refill.

## 2014-06-09 NOTE — Telephone Encounter (Signed)
Pt called stating that her insurance co sent a letter saying that Trulicity is now the preferred drug. Be advised. Pt has 1 refill of her Bydureon left.

## 2014-06-10 NOTE — Telephone Encounter (Signed)
Called pt and lvm advising her per Dr Gherghe's note.  

## 2014-06-12 ENCOUNTER — Telehealth: Payer: Self-pay | Admitting: *Deleted

## 2014-06-12 MED ORDER — LEVOTHYROXINE SODIUM 125 MCG PO TABS: 125.0000 ug | ORAL_TABLET | Freq: Every day | ORAL | Status: DC

## 2014-06-12 MED ORDER — DULAGLUTIDE 1.5 MG/0.5ML ~~LOC~~ SOAJ
1.5000 mg | SUBCUTANEOUS | Status: DC
Start: 2014-06-12 — End: 2014-08-20

## 2014-06-12 NOTE — Telephone Encounter (Signed)
Sent to pts pharmacy 

## 2014-06-12 NOTE — Telephone Encounter (Signed)
1.5 mg sq once a week.

## 2014-06-12 NOTE — Telephone Encounter (Signed)
Pt ready for Dr Cruzita Lederer to send in the rx for Trulicity. Please advise dosage.

## 2014-07-13 ENCOUNTER — Other Ambulatory Visit: Payer: Self-pay | Admitting: Cardiovascular Disease

## 2014-07-17 ENCOUNTER — Telehealth: Payer: Self-pay | Admitting: Cardiovascular Disease

## 2014-07-17 MED ORDER — LISINOPRIL 10 MG PO TABS: 10.0000 mg | ORAL_TABLET | Freq: Every day | ORAL | Status: DC

## 2014-07-17 NOTE — Telephone Encounter (Signed)
Refill sent for lisinopril  

## 2014-07-17 NOTE — Telephone Encounter (Signed)
Pt has made apt to see do gollan 08/06/13 @ 11:15 but needs refill until then lisinprol sent to [facility] on [address]

## 2014-07-23 ENCOUNTER — Ambulatory Visit (INDEPENDENT_AMBULATORY_CARE_PROVIDER_SITE_OTHER): Payer: Managed Care, Other (non HMO) | Admitting: Family Medicine

## 2014-07-23 ENCOUNTER — Encounter: Payer: Self-pay | Admitting: Family Medicine

## 2014-07-23 ENCOUNTER — Telehealth: Payer: Self-pay

## 2014-07-23 VITALS — BP 104/70 | HR 96 | Temp 97.8°F | Wt 218.5 lb

## 2014-07-23 DIAGNOSIS — R1011 Right upper quadrant pain: Secondary | ICD-10-CM

## 2014-07-23 MED ORDER — ONDANSETRON HCL 4 MG PO TABS: 4.0000 mg | ORAL_TABLET | Freq: Three times a day (TID) | ORAL | Status: DC | PRN

## 2014-07-23 NOTE — Telephone Encounter (Signed)
PLEASE NOTE: All timestamps contained within this report are represented as [place] Standard Time. CONFIDENTIALTY NOTICE: This fax transmission is intended only for the addressee. It contains information that is legally privileged, confidential or otherwise protected from use or disclosure. If you are not the intended recipient, you are strictly prohibited from reviewing, disclosing, copying using or disseminating any of this information or taking any action in reliance on or regarding this information. If you have received this fax in error, please notify us immediately by telephone so that we can arrange for its return to [place]. Phone: [phone], Toll-Free: [phone], Fax: [phone] Page: 1 of 2 Call Id: [id number] [place] Patient Name: Sarah Chaney Gender: Female DOB: 1961-03-12 Age: 53 Y 9 M 8 D Return Phone Number: [phone] (Primary) Address: [place]/State/Zip: [place] [address] Client [facility] - Client Client Site [place], [place] Contact Type Call Call Type Triage / Clinical Relationship To Patient Self Return Phone Number [phone] (Primary) Chief Complaint Vomiting Initial Comment caller states she has right side pain, vomiting and rotten egg belching, has urinated PreDisposition Call another nurse Nurse Assessment Nurse: Donalynn Furlong, RN, Myna Hidalgo Date/Time Eilene Ghazi Time): 07/23/2014 9:19:19 AM Confirm and document reason for call. If symptomatic, describe symptoms. ---caller states she has right side pain, vomiting and rotten egg belching, has urinated Has the patient traveled out of the country within the last 30 days? ---No Does the patient require triage? ---Yes Related visit to physician within the last 2 weeks? ---No Does the PT have any chronic conditions? (i.e.  diabetes, asthma, etc.) ---Yes List chronic conditions. ---high blood pressure, diabetes,hypothyroidism Did the patient indicate they were pregnant? ---No Guidelines Guideline Title Affirmed Question Affirmed Notes Nurse Date/Time Eilene Ghazi Time) Abdominal Pain - Female [1] MODERATE (e.g., interferes with normal activities) AND [2] pain comes and goes (cramps) AND [3] present > 24 hours (Exception: pain with Vomiting or Diarrhea - see that Guideline) Donalynn Furlong, RN, Myna Hidalgo 07/23/2014 9:21:09 AM Disp. Time Eilene Ghazi Time) Disposition Final User 07/23/2014 8:50:24 AM Send To Clinical Follow Up Jeral Fruit 07/23/2014 9:33:10 AM See Physician within 24 Hours Yes Donalynn Furlong, RN, Myna Hidalgo PLEASE NOTE: All timestamps contained within this report are represented as [place] Standard Time. CONFIDENTIALTY NOTICE: This fax transmission is intended only for the addressee. It contains information that is legally privileged, confidential or otherwise protected from use or disclosure. If you are not the intended recipient, you are strictly prohibited from reviewing, disclosing, copying using or disseminating any of this information or taking any action in reliance on or regarding this information. If you have received this fax in error, please notify us immediately by telephone so that we can arrange for its return to [place]. Phone: [phone], Toll-Free: [phone], Fax: [phone] Page: 2 of 2 Call Id: [id number] Caller Understands: Yes Disagree/Comply: Comply Care Advice Given Per Guideline SEE PHYSICIAN WITHIN 24 HOURS: CRAMPS: Your cramps may be due to an intestinal virus or from something that you ate. During cramps, drink some water, then lie down and try to find a comfortable position. DIET: * Drink adequate fluids. Eat a bland diet. * Avoid alcohol or caffeinated beverages * Avoid greasy or fatty foods. OTC MEDS - Bismuth Subsalicylate (e.g., Kaopectate, Pepto-Bismol): CAUTION -  Bismuth Subsalicylate (e.g., Kaopectate, Pepto-Bismol): * May cause a temporary darkening of stool and tongue. CALL BACK IF: *  Severe pain lasts over 1 hour * Constant pain lasts over 2 hours * You become worse. CARE ADVICE given per Abdominal Pain, Female (Adult) guideline. * Helps reduce abdominal cramping, diarrhea, and vomiting. * Adult dosage: two tablets or two tablespoons (30 ml) PO. Maximum of 8 doses in a 24 hour period. * Do not use for more than 2 days. After Care Instructions Given Call Event Type User Date / Time Description Comments User: Gennie Alma, RN Date/Time Eilene Ghazi Time): 07/23/2014 9:34:38 AM Pt warm-transferred to PCP office as per directives for a 24 hr "to be seen" outcome to speak with" Larene Beach" Referrals REFERRED TO PCP OFFICE

## 2014-07-23 NOTE — Patient Instructions (Signed)
Sips of fluids, small amounts of bland solids for now.  Check your sugar episodically.  Go to the lab on the [address] out.  We'll contact you with your lab report. Use zofran if needed.  I'll notify [facility] about your situation.  Take care.

## 2014-07-23 NOTE — Telephone Encounter (Signed)
Pt has appt 07/23/14 at 6:15 pm with Dr Damita Dunnings.

## 2014-07-23 NOTE — Progress Notes (Signed)
Pre visit review using our clinic review tool, if applicable. No additional management support is needed unless otherwise documented below in the visit note.  2 days ago, had burping with rotten egg smell. Then had some some GI upset and R upper abd pain. Then vomited.  Vomitus wasn't bloody.   Was able to get to sleep that night, on a heating pad.    Went to work yesterday, didn't eat much yesterday.  Then last night she still didn't have much of an appetite.   This AM again had vomiting and rotten egg taste with belching.  Took some fluids today but no solids today.     She has had isolated rotten egg/vomiting over the years, but not recurrent episodes like this.   No diarrhea. No fevers.  No BM today, but still with flatus.   H/o renal stones but no new dysuria.  No blood in urine.   [place] GB pathology noted.  She has h/o fatty liver w/o gall stones on u/s prev.  She hasn't checked her sugar today.    Meds, vitals, and allergies reviewed.   ROS: See HPI.  Otherwise, noncontributory.  GEN: nad, alert and oriented HEENT: mucous membranes moist NECK: supple w/o LA CV: rrr. PULM: ctab, no inc wob ABD: soft, +bs, R hemiabd mildly ttp but no CVA pain. Normal BS, murphy neg EXT: no edema SKIN: no acute rash

## 2014-07-24 DIAGNOSIS — R1011 Right upper quadrant pain: Secondary | ICD-10-CM | POA: Insufficient documentation

## 2014-07-24 LAB — CBC WITH DIFFERENTIAL/PLATELET
BASOS PCT: 0.1 % (ref 0.0–3.0)
Basophils Absolute: 0 10*3/uL (ref 0.0–0.1)
EOS PCT: 3.2 % (ref 0.0–5.0)
Eosinophils Absolute: 0.4 10*3/uL (ref 0.0–0.7)
HEMATOCRIT: 47.4 % — AB (ref 36.0–46.0)
Hemoglobin: 15.4 g/dL — ABNORMAL HIGH (ref 12.0–15.0)
Lymphocytes Relative: 18.5 % (ref 12.0–46.0)
Lymphs Abs: 2 10*3/uL (ref 0.7–4.0)
MCHC: 32.5 g/dL (ref 30.0–36.0)
MCV: 84.2 fl (ref 78.0–100.0)
MONO ABS: 1 10*3/uL (ref 0.1–1.0)
Monocytes Relative: 8.9 % (ref 3.0–12.0)
NEUTROS PCT: 69.3 % (ref 43.0–77.0)
Neutro Abs: 7.6 10*3/uL (ref 1.4–7.7)
Platelets: 203 10*3/uL (ref 150.0–400.0)
RBC: 5.63 Mil/uL — ABNORMAL HIGH (ref 3.87–5.11)
RDW: 15 % (ref 11.5–15.5)
WBC: 11 10*3/uL — AB (ref 4.0–10.5)

## 2014-07-24 LAB — COMPREHENSIVE METABOLIC PANEL
ALK PHOS: 94 U/L (ref 39–117)
ALT: 59 U/L — AB (ref 0–35)
AST: 41 U/L — AB (ref 0–37)
Albumin: 4 g/dL (ref 3.5–5.2)
BUN: 18 mg/dL (ref 6–23)
CALCIUM: 9.3 mg/dL (ref 8.4–10.5)
CO2: 29 mEq/L (ref 19–32)
CREATININE: 0.8 mg/dL (ref 0.4–1.2)
Chloride: 102 mEq/L (ref 96–112)
GFR: 85.66 mL/min (ref >60.00)
Glucose, Bld: 94 mg/dL (ref 70–99)
Potassium: 4.2 mEq/L (ref 3.5–5.1)
Sodium: 136 mEq/L (ref 135–145)
Total Bilirubin: 0.7 mg/dL (ref 0.2–1.2)
Total Protein: 7.2 g/dL (ref 6.0–8.3)

## 2014-07-24 LAB — LIPASE: LIPASE: 26 U/L (ref 11.0–59.0)

## 2014-07-24 NOTE — Assessment & Plan Note (Signed)
Intermittent, with h/o inc LFTs and fatty liver.   Nontoxic.  D/w pt about her situation.  Would check basic labs today.  See pending labs.  If worsening, to ER.  She could have GB pathology and we may need to consider repeat u/s +/- HIDA.  D/w pt about sips of fluids, bland solids, checking sugar frequently.  zofran prn nausea for now.  She agrees.  Fu prn.  Routed to PCP as FYI.

## 2014-07-28 ENCOUNTER — Telehealth: Payer: Self-pay | Admitting: Family Medicine

## 2014-07-28 DIAGNOSIS — R748 Abnormal levels of other serum enzymes: Secondary | ICD-10-CM

## 2014-07-28 DIAGNOSIS — R1011 Right upper quadrant pain: Secondary | ICD-10-CM

## 2014-07-28 NOTE — Telephone Encounter (Signed)
-----   Message from Tammi Sou, [place] sent at 07/28/2014  4:46 PM EST ----- Pt notified of lab results and Dr. Josefine Class comments/recommendations , as well as Dr. Marliss Coots recommendations. Pt said her sxs are about the same they were really bad on new years day but are improved just a little today. Pt said since she is still having abd pain and diarrhea she does want to have the US done. I advise pt that Marion/Linda will call to schedule appt. (please order [place])

## 2014-07-28 NOTE — Telephone Encounter (Signed)
Order for abd [place]

## 2014-07-30 ENCOUNTER — Telehealth: Payer: Self-pay | Admitting: *Deleted

## 2014-07-30 MED ORDER — ONDANSETRON HCL 4 MG PO TABS: 4.0000 mg | ORAL_TABLET | Freq: Three times a day (TID) | ORAL | Status: DC | PRN

## 2014-07-30 NOTE — Telephone Encounter (Signed)
Go ahead and send to the pharmacy she prefers Thanks

## 2014-07-30 NOTE — Telephone Encounter (Signed)
Please reroute it.  Thanks.

## 2014-07-30 NOTE — Telephone Encounter (Signed)
Done and left voicemail letting pt know Rx sent

## 2014-07-30 NOTE — Telephone Encounter (Signed)
Received a prior authorization request from [place] for Ondansetron. Called patient and was advised that she has need insurance and would like the prescription sent to [place]. Patient stated that she will call [facility] and let them know that she has new insurance and will be getting this script from a different pharmacy. Is it okay to send a new script to different pharmacy?

## 2014-08-01 ENCOUNTER — Encounter: Payer: Self-pay | Admitting: Family Medicine

## 2014-08-01 ENCOUNTER — Ambulatory Visit: Payer: Self-pay | Admitting: Family Medicine

## 2014-08-04 ENCOUNTER — Telehealth: Payer: Self-pay

## 2014-08-04 NOTE — Telephone Encounter (Signed)
Pt left v/m requesting [place] of abdomen on 08/01/14;pt request cb and copy sent to pt.

## 2014-08-04 NOTE — Telephone Encounter (Signed)
I resulted it on mychart- if she has not gotten that please read her my comments - ? How is she feeling Thanks

## 2014-08-05 NOTE — Telephone Encounter (Signed)
Pt left v/m requesting cb about [place] results.

## 2014-08-05 NOTE — Telephone Encounter (Signed)
Glad she is feeling better - keep me posted

## 2014-08-05 NOTE — Telephone Encounter (Signed)
Pt viewed mychart results and she said her sxs are better so she will hold off on the HIDA scan

## 2014-08-06 ENCOUNTER — Ambulatory Visit: Payer: Managed Care, Other (non HMO) | Admitting: Cardiovascular Disease

## 2014-08-06 ENCOUNTER — Encounter: Payer: Self-pay | Admitting: Internal Medicine

## 2014-08-06 ENCOUNTER — Ambulatory Visit (INDEPENDENT_AMBULATORY_CARE_PROVIDER_SITE_OTHER): Payer: 59 | Admitting: Internal Medicine

## 2014-08-06 VITALS — BP 118/64 | HR 93 | Temp 97.8°F | Resp 12 | Wt 217.6 lb

## 2014-08-06 DIAGNOSIS — E063 Autoimmune thyroiditis: Secondary | ICD-10-CM

## 2014-08-06 DIAGNOSIS — E119 Type 2 diabetes mellitus without complications: Secondary | ICD-10-CM

## 2014-08-06 LAB — HEMOGLOBIN A1C: Hgb A1c MFr Bld: 7.6 % — ABNORMAL HIGH (ref 4.6–6.5)

## 2014-08-06 LAB — T4, FREE: FREE T4: 1.64 ng/dL — AB (ref 0.60–1.60)

## 2014-08-06 LAB — TSH: TSH: 4.94 u[IU]/mL — ABNORMAL HIGH (ref 0.35–4.50)

## 2014-08-06 MED ORDER — CANAGLIFLOZIN 300 MG PO TABS: ORAL_TABLET | ORAL | Status: DC

## 2014-08-06 NOTE — Patient Instructions (Addendum)
Patient Instructions  - Continue Trulicity 1.5mg  weekly - Continue Invokana 300 mg daily  Please check about the following meds Trulicity Bydureon [place]  Please stop at the lab.  Please return in 3 months with your sugar log.

## 2014-08-06 NOTE — Progress Notes (Signed)
Patient ID: Sarah Chaney, female   DOB: Dec 13, 1960, 54 y.o.   MRN: [id number]  HPI: Sarah Chaney is a 54 y.o.-year-old female, returning for f/u for DM2, dx 2012, non-insulin-dependent, uncontrolled, without complications (? Peripheral neuropathy) and hypothyroidism. Last visit 3 mo ago.  She had a URI for 2 weeks.   DM2:  Last hemoglobin A1c was: Lab Results  Component Value Date   HGBA1C 8.2* 03/21/2014   HGBA1C 8.1* 11/20/2013   HGBA1C 8.2* 08/21/2013   Pt is on a regimen of: - Bydureon 2 mg weekly >> Trulicity 1.5 mg weekly - but we have to change back to [employer] per insurance preference. Pt does not like Bydureon b/c large needle and the time it takes to mix it up. - Invokana 300 mg daily  Not on Metformin b/c GI upset (AP, not necessarily diarrhea). We tried to start Compounded Metformin but she could not afford it. We stopped Glipizide XL 5 mg and increased Invokana in 07/2013 as she had SEs from Glipizide >> hypoglycemic sxs >> was taken it sporadically She tolerates well the Invokana.  Pt checks her sugars 0-2x a day - only a little higher: - am: 137, 160 >> 132-156 (170 - cereal at 11 pm) >> 123-170s, 182 >> 129-141 >> 120-130 - 2h after b'fast: 145 >> n/c >> 135 >> n/c - before lunch: 122, 144 >> 119, 130, 140 >> 125-180 >> 106, 115, 124 >> 106, 124-150 - 2h after lunch: 185s >> 141-216 >> 154, 170 >> 133, 159 >> 133, 136, 147 >>130-152 - before dinner: 138-150 >> n/c >> 136-141 >> 97-140 >> 175 >> 102, 113 >> 98-124 - bedtime: 125-141 >> 160-170 >> 151 >> 124, 154 >> n/c >> 147 >> n/c No lows. Lowest sugar was 98 ; she has hypoglycemia awareness at 100. Highest sugar was 170 this am.  - no CKD, last BUN/creatinine:  Lab Results  Component Value Date   BUN 18 07/23/2014   CREATININE 0.8 07/23/2014  On Lisinopril. - she has fatty liver ds with transaminitis. AST/ALT stable per last check: Lab Results  Component Value Date   ALT 59* 07/23/2014   AST 41* 07/23/2014    ALKPHOS 94 07/23/2014   BILITOT 0.7 07/23/2014  - last set of lipids: Lab Results  Component Value Date   CHOL 200 03/21/2013   HDL 44.80 03/21/2013   LDLCALC 138* 03/21/2013   LDLDIRECT 138.8 06/29/2011   TRIG 86.0 03/21/2013   CHOLHDL 4 03/21/2013  Not on a statin b/c leg pain. - last eye exam was in 02/2014. No DR.  - no numbness and tingling in her feet (only 1 toe >> post surgery on foot). Foot exam performed in 04/2013.  Hypothyroidism. She takes Levothyroxine 125 mcg - fasting, in am - with water - PPI later She now takes it correctly  Reviewed TFTs >> normal at last check: Lab Results  Component Value Date   TSH 1.41 03/21/2014   TSH 57.17* 12/31/2013   TSH 0.21* 11/20/2013   TSH 0.73 03/21/2013   TSH 0.32* 10/03/2012   FREET4 1.25 03/21/2014   FREET4 1.75* 12/31/2013   FREET4 3.03* 11/20/2013   FREET4 1.00 05/03/2011   FREET4 1.4 11/10/2009   ROS: Constitutional: + weight loss, no fatigue, + hot flushes Eyes: no blurry vision, no xerophthalmia ENT: no sore throat, no nodules palpated in throat, occasional dysphagia/no odynophagia, + hoarseness Cardiovascular: no CP/SOB/ palpitations/no leg swelling Respiratory: no cough/no SOB Gastrointestinal: + N/+ V/no D/C/heartburn  Musculoskeletal: no muscle/joint aches Skin: no rashes Neurological: no tremors/numbness/tingling/dizziness  I reviewed pt's medications, allergies, PMH, social hx, family hx, and changes were documented in the history of present illness. Otherwise, unchanged from my initial visit note.  PE: BP 118/64 mmHg  Pulse 93  Temp(Src) 97.8 F (36.6 C) (Oral)  Resp 12  Wt 217 lb 9.6 oz (98.703 kg)  SpO2 95% Wt Readings from Last 3 Encounters:  08/06/14 217 lb 9.6 oz (98.703 kg)  07/23/14 218 lb 8 oz (99.111 kg)  05/07/14 225 lb (102.059 kg)  Constitutional: overweight, in NAD, tanned Eyes: PERRLA, EOMI, no exophthalmos ENT: dry mucous membranes, no thyromegaly, no cervical  lymphadenopathy Cardiovascular: RRR, No MRG Respiratory: CTA B Gastrointestinal: abdomen soft, NT, ND, BS+ Musculoskeletal: no deformities, strength intact in all 4 Skin: moist, warm, no rashes  ASSESSMENT: 1. DM2, non-insulin-dependent, uncontrolled, without complications  2. Hypothyroidism  PLAN:  1. Patient with long-standing,with great control lately. We started a GLP1 R agonist >> sugars greatly improved so she stopped the glipizide  - now on Trulicity, but will need to change 2/2 insurance coverage -  I suggested to:  Patient Instructions  - Continue Trulicity 2 mg weekly - Continue Invokana 300 mg daily  Please check about the following meds Trulicity Bydureon [place]  Please stop at the lab.  Please return in 3 months with your sugar log.    - advised to stay hydrated - no SEs from [place] - refilled Invokana  - Return to clinic in 3 months with sugar log   2. Hypothyroidism - she appears to be taking Levothyroxine correctly - reviewed TSH and fT4 >> recheck them today   Office Visit on 08/06/2014  Component Date Value Ref Range Status  . TSH 08/06/2014 4.94* 0.35 - 4.50 uIU/mL Final  . Free T4 08/06/2014 1.64* 0.60 - 1.60 ng/dL Final  . Hgb A1c MFr Bld 08/06/2014 7.6* 4.6 - 6.5 % Final   Glycemic Control Guidelines for People with Diabetes:Non Diabetic:  <6%Goal of Therapy: <7%Additional Action Suggested:  >8%    HbA1c improved! TSH a little higher. Will prefer to recheck at next visit before changing the dose as her TSH was normal on this dose in the past year.

## 2014-08-15 ENCOUNTER — Other Ambulatory Visit: Payer: Self-pay | Admitting: Family Medicine

## 2014-08-18 NOTE — Telephone Encounter (Signed)
Rx was called in to [facility].

## 2014-08-18 NOTE — Telephone Encounter (Signed)
Px written for call in   

## 2014-08-18 NOTE — Telephone Encounter (Signed)
Pt called stating she originally requested ambien rx be sent to wrong pharmacy. I called [facility] and canceled ambien rx, then called in to [facility] employee pharmacy as pt requested.

## 2014-08-20 ENCOUNTER — Encounter: Payer: Self-pay | Admitting: Cardiovascular Disease

## 2014-08-20 ENCOUNTER — Ambulatory Visit (INDEPENDENT_AMBULATORY_CARE_PROVIDER_SITE_OTHER): Payer: 59 | Admitting: Cardiovascular Disease

## 2014-08-20 VITALS — BP 110/72 | HR 73 | Ht 68.0 in | Wt 222.2 lb

## 2014-08-20 DIAGNOSIS — R42 Dizziness and giddiness: Secondary | ICD-10-CM

## 2014-08-20 DIAGNOSIS — I10 Essential (primary) hypertension: Secondary | ICD-10-CM

## 2014-08-20 DIAGNOSIS — E7800 Pure hypercholesterolemia, unspecified: Secondary | ICD-10-CM

## 2014-08-20 DIAGNOSIS — R002 Palpitations: Secondary | ICD-10-CM

## 2014-08-20 DIAGNOSIS — E119 Type 2 diabetes mellitus without complications: Secondary | ICD-10-CM

## 2014-08-20 MED ORDER — EZETIMIBE 10 MG PO TABS: 10.0000 mg | ORAL_TABLET | Freq: Every day | ORAL | Status: DC

## 2014-08-20 NOTE — Progress Notes (Signed)
Patient ID: Sarah Chaney, female    DOB: 07/16/1961, 54 y.o.   MRN: [id number]  HPI Comments: Sarah Chaney is a very pleasant 54 year old woman with a history of hypertension, hyperlipidemia, palpitations who was seen previously  for palpitations who presents for follow-up of her palpitations.,  She currently works at [employer] in [place]. She stopped smoking 3 years ago.Weight continues to be a problem. Previously diagnosed with NASH.  In follow-up today, she reports having significant stressors at home. Husband recently had abdominal surgery. She spent a week in the hospital with him . She has been noticing some shortness of breath with exertion, occasional chest tightness with exertion. Some palpitations, sometimes associated with dizziness.   hemoglobin A1c has improved from 8.2 down to 7.5 on her new medications. She's not doing any regular exercise. Working full-time, taking care of her husband in her off time she is concerned about coronary disease as many of her extended family have cardiac issues. She takes metoprolol 50 mg in the morning, none in the evening. Occasionally has breakthrough palpitations in the daytime, as well as night Trouble with eating desserts in the evenings  EKG on today's visit shows normal sinus rhythm with rate 73 bpm, no significant ST or T-wave changes   She does take proton pump inhibitor for GERD. She also reports having slow GI motility    Allergies  Allergen Reactions  . Carbamazepine     REACTION: hives  . Diltiazem Hcl     REACTION: chills, fever  . Metformin And Related Other (See Comments)    GI   . Nifedipine   . Verapamil     REACTION: edema    Outpatient Encounter Prescriptions as of 08/20/2014  Medication Sig  . aspirin 81 MG tablet Take 162 mg by mouth daily.  . canagliflozin (INVOKANA) 300 MG TABS tablet Take 1 tab by mouth in am  . Exenatide ER (BYDUREON) 2 MG PEN INJECT ONCE WEEKLY ON SUNDAYS  . glucose blood  (FREESTYLE LITE) test strip Check sugars 2x a day  . levothyroxine (SYNTHROID, LEVOTHROID) 125 MCG tablet Take 1 tablet (125 mcg total) by mouth daily before breakfast.  . lisinopril (PRINIVIL,ZESTRIL) 10 MG tablet Take 1 tablet (10 mg total) by mouth daily.  . metoprolol (LOPRESSOR) 50 MG tablet Take 1 tablet (50 mg total) by mouth 2 (two) times daily.  Marland Kitchen omeprazole (PRILOSEC) 40 MG capsule Take 1 capsule (40 mg total) by mouth 2 (two) times daily.  . SUMAtriptan (IMITREX) 50 MG tablet Take 1 tablet (50 mg total) by mouth once. May repeat in 2 hours if headache persists or recurs. (Patient taking differently: Take 50 mg by mouth once as needed. May repeat in 2 hours if headache persists or recurs.)  . zolpidem (AMBIEN) 10 MG tablet TAKE ONE TABLET BY MOUTH AT BEDTIME AS NEEDED FOR SLEEP  . ezetimibe (ZETIA) 10 MG tablet Take 1 tablet (10 mg total) by mouth daily.  . [DISCONTINUED] Dulaglutide (TRULICITY) 1.5 JI/9.6VE SOPN Inject 1.5 mg into the skin once a week. (Patient not taking: Reported on 08/20/2014)  . [DISCONTINUED] ondansetron (ZOFRAN) 4 MG tablet Take 1 tablet (4 mg total) by mouth every 8 (eight) hours as needed for nausea or vomiting. (Patient not taking: Reported on 08/20/2014)    Past Medical History  Diagnosis Date  . Anemia, unspecified   . Chondromalacia of patella   . Gastric polyps   . GERD (gastroesophageal reflux disease)   . Headache(784.0)   .  Unspecified essential hypertension   . Unspecified hypothyroidism   . Irritable bowel syndrome   . Persistent disorder of initiating or maintaining sleep   . Lateral epicondylitis  of elbow   . Variants of migraine, not elsewhere classified, without mention of intractable migraine without mention of status migrainosus   . Obesity, unspecified   . Palpitations   . Unspecified polyarthropathy or polyarthritis, multiple sites   . 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 Pure  hypercholesterolemia   . Restless legs syndrome (RLS)   . Disorders of bursae and tendons in shoulder region, unspecified   . Tachycardia, unspecified   . Personal history of tobacco use, presenting hazards to health   . Neuralgia, neuritis, and radiculitis, unspecified   . Diverticulosis   . Adenomatous colon polyp 1997  . Diabetes mellitus     Past Surgical History  Procedure Laterality Date  . Popliteal synovial cyst excision  12/2003  . Esophagogastroduodenoscopy  12/2001  . Colonoscopy  12/2001    Negative  . Polypectomy  12/2001  . Abd [place]  12/2002    Fatty liver  . Esophagogastroduodenoscopy  11/2007    nml  . Abd [place]  05/2010    Fatty liver, normal gall bladder and pancreas  . Exercise treadmill  5/13    normal    Social History  reports that she quit smoking about 2 years ago. Her smoking use included Cigarettes. She has a 9.6 pack-year smoking history. She has never used smokeless tobacco. She reports that she drinks alcohol. She reports that she does not use illicit drugs.  Family History family history includes Diabetes in her mother and another family member; Diabetes type II in her father; Hyperlipidemia in her mother.     Review of Systems  Constitutional: Negative.   Respiratory: Positive for chest tightness and shortness of breath.   Cardiovascular: Positive for palpitations.  Gastrointestinal: Negative.   Musculoskeletal: Negative.   Neurological: Negative.   Hematological: Negative.   Psychiatric/Behavioral: Negative.   All other systems reviewed and are negative.  BP 110/72 mmHg  Pulse 73  Ht 5\' 8"  (1.727 m)  Wt 222 lb 4 oz (100.812 kg)  BMI 33.80 kg/m2  Physical Exam  Constitutional: She is oriented to person, place, and time. She appears well-developed and well-nourished.  Obese  HENT:  Head: Normocephalic.  Nose: Nose normal.  Mouth/Throat: Oropharynx is clear and moist.  Eyes: Conjunctivae are normal. Pupils are equal, round, and  reactive to light.  Neck: Normal range of motion. Neck supple. No JVD present.  Cardiovascular: Normal rate, regular rhythm, S1 normal, S2 normal, normal heart sounds and intact distal pulses.  Exam reveals no gallop and no friction rub.   No murmur heard. Pulmonary/Chest: Effort normal and breath sounds normal. No respiratory distress. She has no wheezes. She has no rales. She exhibits no tenderness.  Abdominal: Soft. Bowel sounds are normal. She exhibits no distension. There is no tenderness.  Musculoskeletal: Normal range of motion. She exhibits no edema or tenderness.  Lymphadenopathy:    She has no cervical adenopathy.  Neurological: She is alert and oriented to person, place, and time. Coordination normal.  Skin: Skin is warm and dry. No rash noted. No erythema.  Psychiatric: She has a normal mood and affect. Her behavior is normal. Judgment and thought content normal.    Assessment and Plan  Nursing note and vitals reviewed.

## 2014-08-20 NOTE — Patient Instructions (Signed)
Call if you would like a Calcium score (in [place]) Try extra 1/2 metoprolol tartrate at night (or Am) Call if you would like the metoprolol succinate (one a day pill), or the propranolol 4 to 6 hour pill for breakthrough palpitations, or diltiazem (Calcium channel blocker)  Try stress reduction techniques, walking  Watch the sugars/sweets  Start zetia one a day, Call the office if you would like to try the low dose simvastatin   Please call us if you have new issues that need to be addressed before your next appt.  Your physician wants you to follow-up in: 6 months.  You will receive a reminder letter in the mail two months in advance. If you don't receive a letter, please call our office to schedule the follow-up appointment.

## 2014-08-20 NOTE — Assessment & Plan Note (Signed)
I suspect she is having ectopy, possibly exacerbated by recent stress. 48 hour Holter monitor was offered. She has declined at this time. Suggested she use her phone to track her heart rhythm. Also suggested she try extra metoprolol, perhaps adding 25 mg in the evening. She is only taking metoprolol tartrate once a day in the morning. Alternatively she could take propranolol in addition to daily metoprolol as needed for breakthrough palpitations

## 2014-08-20 NOTE — Assessment & Plan Note (Signed)
Long discussion today concerning her cholesterol. She will try the zetia 10 mg daily. If tolerated, suggested she consider adding low-dose simvastatin 10 mg daily or every other day. Potentially could change to vytorin 10/10 mg daily.Marland Kitchen

## 2014-08-20 NOTE — Assessment & Plan Note (Signed)
Occasional lightheadedness and dizziness in the setting of palpitations. We have suggested if symptoms get worse, that she call our office for a Holter monitor. If symptoms get worse, could hold the lisinopril for a trial period to determine if she is having hypotension in the setting of arrhythmia

## 2014-08-20 NOTE — Assessment & Plan Note (Signed)
Blood pressure is well controlled on today's visit. No changes made to the medications. 

## 2014-08-20 NOTE — Assessment & Plan Note (Signed)
We have encouraged continued exercise, careful diet management in an effort to lose weight. 

## 2014-08-25 ENCOUNTER — Ambulatory Visit: Payer: Self-pay | Admitting: Vascular Surgery

## 2014-09-12 ENCOUNTER — Telehealth: Payer: Self-pay | Admitting: *Deleted

## 2014-09-12 ENCOUNTER — Other Ambulatory Visit: Payer: Self-pay | Admitting: Family Medicine

## 2014-09-12 ENCOUNTER — Telehealth: Payer: Self-pay | Admitting: Internal Medicine

## 2014-09-12 MED ORDER — DULAGLUTIDE 1.5 MG/0.5ML ~~LOC~~ SOAJ: 1.5000 mg | SUBCUTANEOUS | Status: DC

## 2014-09-12 NOTE — Telephone Encounter (Signed)
Spoke with pt. She does not have any medication. Sending Trulicity to pharmacy to see if they will fill.

## 2014-09-12 NOTE — Telephone Encounter (Signed)
Pt is still waiting on her trulicity rx to be called into [facility]

## 2014-09-12 NOTE — Telephone Encounter (Signed)
Px written for call in   

## 2014-09-12 NOTE — Telephone Encounter (Signed)
Pt request refills placed on ambien to [facility]; pt last saw Dr Glori Bickers 05/29/2013 and sick visit 07/23/14. No future appt scheduled.Please advise.

## 2014-09-12 NOTE — Telephone Encounter (Signed)
Rx called in to requested pharmacy 

## 2014-09-12 NOTE — Telephone Encounter (Signed)
Called pt back and lvm advising her that we just sent the PA off yesterday to the ins co. Advised her that we do not have an answer/approval back yet. Will contact her when we do.

## 2014-09-15 ENCOUNTER — Telehealth: Payer: Self-pay | Admitting: Internal Medicine

## 2014-09-15 DIAGNOSIS — E119 Type 2 diabetes mellitus without complications: Secondary | ICD-10-CM

## 2014-09-15 MED ORDER — NATEGLINIDE 60 MG PO TABS: 60.0000 mg | ORAL_TABLET | Freq: Three times a day (TID) | ORAL | Status: DC

## 2014-09-15 NOTE — Telephone Encounter (Signed)
Called pt and she is out of inj. Ins requiring PA for Trulicity and Bydureon. PA completed and faxed to pt's ins co on 2/18; waiting for response. Please advise what pt should do in the meantime.

## 2014-09-15 NOTE — Telephone Encounter (Signed)
Called pt and lvm advising her per Dr Arman Filter note. Advised pt to call back with any questions.

## 2014-09-15 NOTE — Telephone Encounter (Signed)
Pt wants you to callher back at work on [phone] she needs to talk to you about her injections  Thanks Baxter Flattery

## 2014-09-15 NOTE — Telephone Encounter (Signed)
I called in Starlix 60 mg for her. This is a short-acting medicine that should be taken right before a meal. This is a low dose, but she can start by taking half of a pill before a meal and then increase to a full tablet as needed. I only sent at a few tablets (to cover her for 10 days) since I do not expect that this would be a long time treatment for her. But she has 2 refills, just in case.

## 2014-09-16 ENCOUNTER — Telehealth: Payer: Self-pay | Admitting: Internal Medicine

## 2014-09-16 ENCOUNTER — Other Ambulatory Visit: Payer: Self-pay | Admitting: *Deleted

## 2014-09-16 MED ORDER — BAYER CONTOUR NEXT MONITOR W/DEVICE KIT: PACK | Status: AC

## 2014-09-16 MED ORDER — BAYER MICROLET LANCETS MISC: Status: AC

## 2014-09-16 MED ORDER — GLUCOSE BLOOD VI STRP
ORAL_STRIP | Status: AC
Start: 2014-09-16 — End: ?

## 2014-09-16 NOTE — Telephone Encounter (Signed)
Pt calling again regarding another trulicity auth coming soon

## 2014-09-16 NOTE — Telephone Encounter (Signed)
Done

## 2014-09-16 NOTE — Telephone Encounter (Signed)
Also call in contour next meter and strips with microlit lancets   She is checking the sugars 3 or 4 times daily

## 2014-09-17 ENCOUNTER — Ambulatory Visit: Payer: Self-pay | Admitting: Internal Medicine

## 2014-09-19 ENCOUNTER — Other Ambulatory Visit: Payer: Self-pay

## 2014-09-19 MED ORDER — METOPROLOL TARTRATE 50 MG PO TABS: 50.0000 mg | ORAL_TABLET | Freq: Two times a day (BID) | ORAL | Status: DC

## 2014-09-23 ENCOUNTER — Ambulatory Visit
Admit: 2014-09-23 | Disposition: A | Discharge status: Home / Self Care | Payer: Self-pay | Attending: Internal Medicine | Admitting: Internal Medicine

## 2014-09-30 ENCOUNTER — Telehealth: Payer: Self-pay | Admitting: Internal Medicine

## 2014-09-30 NOTE — Telephone Encounter (Signed)
Pt calling to discuss her extreme exhaustion. Please advise if she should come in before her appt for blood work

## 2014-09-30 NOTE — Telephone Encounter (Signed)
Yes, let's recheck her TSh and fT4.

## 2014-09-30 NOTE — Telephone Encounter (Signed)
Returned Sarah Chaney's call. Sarah Chaney stated that she is usually very energetic. She has become progressively, extremely fatigued. Sarah Chaney wants to know if Dr Cruzita Lederer would be willing to check her thyroid to see if this is the cause. Please advise.

## 2014-10-01 ENCOUNTER — Other Ambulatory Visit: Payer: Self-pay | Admitting: *Deleted

## 2014-10-01 ENCOUNTER — Telehealth: Payer: Self-pay | Admitting: Internal Medicine

## 2014-10-01 ENCOUNTER — Other Ambulatory Visit: Payer: Self-pay | Admitting: Internal Medicine

## 2014-10-01 DIAGNOSIS — R5383 Other fatigue: Secondary | ICD-10-CM

## 2014-10-01 NOTE — Telephone Encounter (Signed)
Dr. Cruzita Lederer, Please see below

## 2014-10-01 NOTE — Telephone Encounter (Signed)
[facility] called stated patient need Dr Cruzita Lederer signature on order for Tsh, Free 4,  Fax over new order to [phone]

## 2014-10-01 NOTE — Telephone Encounter (Signed)
Lab orders faxed to [facility] lab. Called pt and advised her.

## 2014-10-01 NOTE — Telephone Encounter (Signed)
Spoke with pt she would like the order put in and sent to [facility] please call when this is doen

## 2014-10-02 ENCOUNTER — Telehealth: Payer: Self-pay | Admitting: *Deleted

## 2014-10-02 ENCOUNTER — Encounter: Payer: Self-pay | Admitting: Internal Medicine

## 2014-10-02 DIAGNOSIS — E063 Autoimmune thyroiditis: Secondary | ICD-10-CM

## 2014-10-02 NOTE — Telephone Encounter (Signed)
OK, will sign

## 2014-10-02 NOTE — Telephone Encounter (Signed)
Orders signed and faxed to [facility].

## 2014-10-02 NOTE — Telephone Encounter (Signed)
Called pt and lvm advising her per Dr Arman Filter note. Advised pt to call back and let us know when she took her LT4 in relation to when she had her labs drawn.

## 2014-10-02 NOTE — Progress Notes (Signed)
Received labs from [facility] (10/01/2014):  - TSH 3.306 - fT4 1.75 (0.61-1.12) - this is a little high, which is expected if the lab draw was close to LT4 dosing Continue current LT4 dose.

## 2014-10-24 ENCOUNTER — Ambulatory Visit
Admit: 2014-10-24 | Disposition: A | Discharge status: Home / Self Care | Payer: Self-pay | Attending: Internal Medicine | Admitting: Internal Medicine

## 2014-10-27 ENCOUNTER — Encounter: Payer: Self-pay | Admitting: Gastroenterology

## 2014-11-05 ENCOUNTER — Telehealth: Payer: Self-pay | Admitting: Internal Medicine

## 2014-11-05 ENCOUNTER — Encounter: Payer: Self-pay | Admitting: Internal Medicine

## 2014-11-05 ENCOUNTER — Ambulatory Visit (INDEPENDENT_AMBULATORY_CARE_PROVIDER_SITE_OTHER): Payer: 59 | Admitting: Internal Medicine

## 2014-11-05 ENCOUNTER — Other Ambulatory Visit: Payer: Self-pay

## 2014-11-05 VITALS — BP 118/74 | HR 68 | Wt 227.3 lb

## 2014-11-05 VITALS — BP 122/70 | HR 77 | Temp 97.5°F | Resp 12 | Wt 226.0 lb

## 2014-11-05 DIAGNOSIS — E063 Autoimmune thyroiditis: Secondary | ICD-10-CM

## 2014-11-05 DIAGNOSIS — E119 Type 2 diabetes mellitus without complications: Secondary | ICD-10-CM

## 2014-11-05 DIAGNOSIS — R5383 Other fatigue: Secondary | ICD-10-CM | POA: Diagnosis not present

## 2014-11-05 LAB — COMPREHENSIVE METABOLIC PANEL
ALT: 55 U/L — ABNORMAL HIGH (ref 0–35)
AST: 45 U/L — ABNORMAL HIGH (ref 0–37)
Albumin: 4 g/dL (ref 3.5–5.2)
Alkaline Phosphatase: 108 U/L (ref 39–117)
BUN: 13 mg/dL (ref 6–23)
CALCIUM: 9.8 mg/dL (ref 8.4–10.5)
CHLORIDE: 104 meq/L (ref 96–112)
CO2: 25 mEq/L (ref 19–32)
Creatinine, Ser: 0.75 mg/dL (ref 0.40–1.20)
GFR: 85.57 mL/min (ref >60.00)
GLUCOSE: 143 mg/dL — AB (ref 70–99)
Potassium: 4.4 mEq/L (ref 3.5–5.1)
Sodium: 136 mEq/L (ref 135–145)
Total Bilirubin: 0.4 mg/dL (ref 0.2–1.2)
Total Protein: 7 g/dL (ref 6.0–8.3)

## 2014-11-05 LAB — VITAMIN B12: VITAMIN B 12: 381 pg/mL (ref 211–911)

## 2014-11-05 LAB — VITAMIN D 25 HYDROXY (VIT D DEFICIENCY, FRACTURES): VITD: 19.89 ng/mL — ABNORMAL LOW (ref 30.00–100.00)

## 2014-11-05 LAB — HEMOGLOBIN A1C: Hgb A1c MFr Bld: 7.9 % — ABNORMAL HIGH (ref 4.6–6.5)

## 2014-11-05 MED ORDER — CYCLOSET 0.8 MG PO TABS: ORAL_TABLET | ORAL | Status: DC

## 2014-11-05 NOTE — Telephone Encounter (Signed)
Patient stated that meds that was sent in was to expensive, can't afford it, do you have a copay card. Please advise phone# [phone]

## 2014-11-05 NOTE — Progress Notes (Signed)
Patient ID: Sarah Chaney, female   DOB: February 20, 1961, 54 y.o.   MRN: [id number]  HPI: Sarah Chaney is a 54 y.o.-year-old female, returning for f/u for DM2, dx 2012, non-insulin-dependent, uncontrolled, without complications (? Peripheral neuropathy) and hypothyroidism. Last visit 3 mo ago.  DM2:  Last hemoglobin A1c was: Lab Results  Component Value Date   HGBA1C 7.6* 08/06/2014   HGBA1C 8.2* 03/21/2014   HGBA1C 8.1* 11/20/2013   Pt is on a regimen of: - Bydureon 2 mg weekly >> Trulicity 1.5 mg weekly - Invokana 300 mg daily She was on Starlix for a short period of time.  Not on Metformin b/c GI upset (AP, not necessarily diarrhea). We tried to start Compounded Metformin but she could not afford it. We stopped Glipizide XL 5 mg and increased Invokana in 07/2013 as she had SEs from Glipizide >> hypoglycemic sxs >> was taken it sporadically She tolerates well the Invokana.  She is interested in gastric sleeve >> 4000$ deductible.  Pt checks her sugars 0-2x a day - higher: - am: 137, 160 >> 132-156 (170 - cereal at 11 pm) >> 123-170s, 182 >> 129-141 >> 120-130 >> 142-176 - 2h after b'fast: 145 >> n/c >> 135 >> n/c - before lunch: 122, 144 >> 119, 130, 140 >> 125-180 >> 106, 115, 124 >> 106, 124-150 >> 137-181, 197 - 2h after lunch: 185s >> 141-216 >> 154, 170 >> 133, 159 >> 133, 136, 147 >>130-152 >> 124-171, 243x1 - before dinner: 138-150 >> n/c >> 136-141 >> 97-140 >> 175 >> 102, 113 >> 98-124 >> 118-134 - bedtime: 125-141 >> 160-170 >> 151 >> 124, 154 >> n/c >> 147 >> n/c >> 141, 171-192 No lows. Lowest sugar was 80x1 ; she has hypoglycemia awareness at 100. Highest sugar was 243.  - no CKD, last BUN/creatinine:  Lab Results  Component Value Date   BUN 18 07/23/2014   CREATININE 0.8 07/23/2014  On Lisinopril. - she has fatty liver ds with transaminitis. AST/ALT stable per last check: Lab Results  Component Value Date   ALT 59* 07/23/2014   AST 41* 07/23/2014   ALKPHOS 94  07/23/2014   BILITOT 0.7 07/23/2014  - last set of lipids: Lab Results  Component Value Date   CHOL 200 03/21/2013   HDL 44.80 03/21/2013   LDLCALC 138* 03/21/2013   LDLDIRECT 138.8 06/29/2011   TRIG 86.0 03/21/2013   CHOLHDL 4 03/21/2013  Not on a statin b/c leg pain. - last eye exam was in 02/2014. No DR.  - no numbness and tingling in her feet (only 1 toe >> post surgery on foot). Foot exam performed in 04/2013.  Hypothyroidism. She takes Levothyroxine 125 mcg - fasting, in am - no b'fast usually (sometimes yoghurt, oatmeal) - with water - PPI later (Omeprazole 40) - takes this 2-3h later! She now takes it correctly  Reviewed TFTs >> normal at last check: Received labs from [facility] (10/01/2014):  - TSH 3.306 - fT4 1.75 (0.61-1.12) - this is a little high, which is expected if the lab draw was close to LT4 dosing Lab Results  Component Value Date   TSH 4.94* 08/06/2014   TSH 1.41 03/21/2014   TSH 57.17* 12/31/2013   TSH 0.21* 11/20/2013   TSH 0.73 03/21/2013   FREET4 1.64* 08/06/2014   FREET4 1.25 03/21/2014   FREET4 1.75* 12/31/2013   FREET4 3.03* 11/20/2013   FREET4 1.00 05/03/2011  The TSH of 57 was in the setting of Dexilant.  ROS: Constitutional: + weight gain, + fatigue, + hot flushes Eyes: no blurry vision, no xerophthalmia ENT: no sore throat, no nodules palpated in throat, occasional dysphagia/no odynophagia Cardiovascular: no CP/SOB/+ palpitations (not new, on Metoprolol)/no leg swelling Respiratory: no cough/no SOB Gastrointestinal: no N/V/D/C/heartburn Musculoskeletal: no muscle/joint aches Skin: no rashes Neurological: no tremors/numbness/tingling/dizziness, + HA  I reviewed pt's medications, allergies, PMH, social hx, family hx, and changes were documented in the history of present illness. Otherwise, unchanged from my initial visit note.  PE: BP 122/70 mmHg  Pulse 77  Temp(Src) 97.5 F (36.4 C) (Oral)  Resp 12  Wt 226 lb (102.513  kg)  SpO2 94% Body mass index is 34.37 kg/(m^2).  Wt Readings from Last 3 Encounters:  11/05/14 226 lb (102.513 kg)  08/20/14 222 lb 4 oz (100.812 kg)  08/06/14 217 lb 9.6 oz (98.703 kg)  Constitutional: overweight, in NAD Eyes: PERRLA, EOMI, no exophthalmos ENT: dry mucous membranes, no thyromegaly, no cervical lymphadenopathy Cardiovascular: RRR, No MRG Respiratory: CTA B Gastrointestinal: abdomen soft, NT, ND, BS+ Musculoskeletal: no deformities, strength intact in all 4 Skin: moist, warm, no rashes  ASSESSMENT: 1. DM2, non-insulin-dependent, uncontrolled, without complications  2. Hypothyroidism  PLAN:  1. Patient with long-standing, with worsening control lately. She initially improved significantly after adding a GLP1 R agonist >> sugars greatly improved so she stopped the glipizide  - now on Trulicity 2/2 insurance coverage. She is also on Invokana, which she tolerates well. She has many intolerances, and we also would not want to add medicines that hinder her weight loss efforts. Therefore, I suggested to try Cycloset. -  I suggested to:  Patient Instructions  Please continue: - Invokana 300 mg daily in am - Trulicity 1.5 mg weekly  Add Cycloset 0.8 mg daily first thing in am for 1 week, then increase to 1.6 mg for another week, then increase to 2.4 mg daily if still needed.  Please return in 1.5 month with your sugar log.   - Discussed possible side effects of Cycloset to include nausea and dizziness - There is a possible interaction between Cycloset and Imitrex, however patient is not taking Imitrex anymore, just has it prn. I advised her to skip Cycloset if she has to take the image checks - no SEs from [place] - refilled her diabetic meds - Return to clinic in 1.5 months with sugar log   2. Hypothyroidism - she appears to be taking Levothyroxine correctly, except, the PPI is too close to the levothyroxine. I advised her to move the PPI 4 hours from LT4 - reviewed  recent TSH and fT4 >> normal - Continue 125 g of LT4 daily   3. Fatigue - Patient tells me that she feels very fatigued lately - We will check a CMP, vitamin B12, methylmalonic acid, vitamin D   - time spent with the patient: 40 min, of which >50% was spent in obtaining information about her symptoms, reviewing her previous labs, evaluations, and treatments, counseling her about her conditions (please see the discussed topics above), and developing a plan to further investigate them; she had a number of questions which I addressed.  Office Visit on 11/05/2014  Component Date Value Ref Range Status  . Hgb A1c MFr Bld 11/05/2014 7.9* 4.6 - 6.5 % Final   Glycemic Control Guidelines for People with Diabetes:Non Diabetic:  <6%Goal of Therapy: <7%Additional Action Suggested:  >8%   . Vitamin B-12 11/05/2014 381  211 - 911 pg/mL Final  . VITD 11/05/2014 19.89*  30.00 - 100.00 ng/mL Final  . Sodium 11/05/2014 136  135 - 145 mEq/L Final  . Potassium 11/05/2014 4.4  3.5 - 5.1 mEq/L Final  . Chloride 11/05/2014 104  96 - 112 mEq/L Final  . CO2 11/05/2014 25  19 - 32 mEq/L Final  . Glucose, Bld 11/05/2014 143* 70 - 99 mg/dL Final  . BUN 11/05/2014 13  6 - 23 mg/dL Final  . Creatinine, Ser 11/05/2014 0.75  0.40 - 1.20 mg/dL Final  . Total Bilirubin 11/05/2014 0.4  0.2 - 1.2 mg/dL Final  . Alkaline Phosphatase 11/05/2014 108  39 - 117 U/L Final  . AST 11/05/2014 45* 0 - 37 U/L Final  . ALT 11/05/2014 55* 0 - 35 U/L Final  . Total Protein 11/05/2014 7.0  6.0 - 8.3 g/dL Final  . Albumin 11/05/2014 4.0  3.5 - 5.2 g/dL Final  . Calcium 11/05/2014 9.8  8.4 - 10.5 mg/dL Final  . GFR 11/05/2014 85.57  >60.00 mL/min Final   Message sent: Dear Ms Hillyard, Hemoglobin A1c is a little be higher than before, as expected. The vitamin B-12 is on the low side, I'm still expecting the methylmalonic acid to come back. If this is high, I would definitely recommend B12 supplements. Alternatively, you can start to take a  B12 over-the-counter vitamin daily now to see if this will improve your fatigue.  Your vitamin D is low (normal higher than 30), I would suggest to start at 2000 units over-the-counter vitamin D supplement daily. Your liver tests are a little high, similar to before. Your kidney function is good. Sincerely, Philemon Kingdom MD

## 2014-11-05 NOTE — Telephone Encounter (Signed)
Called pt and lvm advising her that we do have a copay card. Advised pt to let us know if she is going to pick it up or does she want me to mail it.

## 2014-11-05 NOTE — Patient Outreach (Signed)
[facility]) Care Management   11/05/2014  Sarah Chaney 1961-06-08 [id number]  Sarah Chaney is an 54 y.o. female.  Member seen for initial office visit for Link to Wellness program for self management of Type 2 diabetes  Subjective: States that she has been more aware of the foods she is eating since taking the Core classes. States that she saw Dr.Gherge with morning and she put her on a new medication for her diabetes.   States that she has been trying harder to watch her diet since going to the classes  Objective:   Review of Systems  All other systems reviewed and are negative.   Physical Exam  Filed Vitals:   11/05/14 1521  BP: 118/74  Pulse: 68   Filed Weights   11/05/14 1521  Weight: 227 lb 4.8 oz (103.103 kg)    Current Medications:   Current Outpatient Prescriptions  Medication Sig Dispense Refill  . aspirin 81 MG tablet Take 162 mg by mouth daily.    Marland Kitchen BAYER MICROLET LANCETS lancets Use to test blood sugar 4 times daily as instructed. 200 each 10  . Biotin 10 MG TABS Take 1 tablet by mouth daily.    . Blood Glucose Monitoring Suppl (BAYER CONTOUR NEXT MONITOR) W/DEVICE KIT Use to test blood sugar 4 times daily. 1 kit 0  . canagliflozin (INVOKANA) 300 MG TABS tablet Take 1 tab by mouth in am 90 tablet 1  . CYCLOSET 0.8 MG TABS Take 3 tablets first thing in am 90 tablet 2  . Dulaglutide (TRULICITY) 1.5 BW/4.6KZ SOPN Inject 1.5 mg into the skin once a week. 5 pen 2  . ezetimibe (ZETIA) 10 MG tablet Take 1 tablet (10 mg total) by mouth daily. 30 tablet 11  . glucose blood (BAYER CONTOUR NEXT TEST) test strip Use to test blood sugar 4 times daily as instructed. 125 each 11  . levothyroxine (SYNTHROID, LEVOTHROID) 125 MCG tablet Take 1 tablet (125 mcg total) by mouth daily before breakfast. 90 tablet 1  . lisinopril (PRINIVIL,ZESTRIL) 10 MG tablet Take 1 tablet (10 mg total) by mouth daily. 30 tablet 6  . meloxicam (MOBIC) 15 MG tablet     . metoprolol  (LOPRESSOR) 50 MG tablet Take 1 tablet (50 mg total) by mouth 2 (two) times daily. 60 tablet 3  . omeprazole (PRILOSEC) 40 MG capsule Take 1 capsule (40 mg total) by mouth 2 (two) times daily. 60 capsule 11  . zolpidem (AMBIEN) 10 MG tablet TAKE 1 TABLET BY MOUTH NIGHTLY AT BEDTIME AS NEEDED FOR SLEEP 30 tablet 3  . Exenatide ER (BYDUREON) 2 MG PEN INJECT ONCE WEEKLY ON SUNDAYS (Patient not taking: Reported on 11/05/2014) 12 each 3  . SUMAtriptan (IMITREX) 50 MG tablet Take 1 tablet (50 mg total) by mouth once. May repeat in 2 hours if headache persists or recurs. (Patient not taking: Reported on 11/05/2014) 10 tablet 0   No current facility-administered medications for this visit.    Functional Status:   In your present state of health, do you have any difficulty performing the following activities: 11/05/2014  Hearing? N  Vision? N  Difficulty concentrating or making decisions? N  Walking or climbing stairs? N  Dressing or bathing? N  Doing errands, shopping? N    Fall/Depression Screening:    PHQ 2/9 Scores 11/05/2014  PHQ - 2 Score 0   THN CM Care Plan        Patient Outreach from 11/05/2014 in [facility] Link To Wellness   Care Plan Problem One  Elevated blood sugars related to dx of Type 2 DM   Care Plan for Problem One  Active   Interventions for Problem One Long Term Goal  Instructed on CHO counting and portion control, Discussed ways to avoid late night snacking and low CHO/protein snacks that would be a better choice, instructed on importance of regular exercise for glycemic control   THN Long Term Goal (31-90 days)  Member will decrease hemoglobin A1C to 7.0 or less in the next 90 days   THN Long Term Goal Start Date  11/05/14   THN CM Short Term Goal #1 (0-30 days)  Member will complete EMMI programs by 12/06/14   [facility] CM Short Term Goal #1 Start Date  11/05/14     Assessment:  New member seen to enroll in Link to Wellness program for self management of type 2 DM.   She reports she does eat out several times a week but tries to make good choices.  She has been going to gym 1-2 times a week.  Seen by Dr.Gherghe this AM and she will see her on 12/17/14.  Plan:   Plan to eat 30-45 GM (2-3) servings of carbohydrate a meal and 15 GM for snacks. Plan to check blood sugar 2 times a day either fasting or 1 -2hrs after a meal.  Goals of 80-130 fasting and 180 or less 1-2 hours after eating. Plan to go to gym 3-4 days a week for 60 minutes.  Goal of 150 minutes a week. Plan to complete EMMI programs by 12/06/14 Plan to return to Link to Wellness on 01/21/15 [place] RN, [place] Management [phone]

## 2014-11-05 NOTE — Patient Instructions (Addendum)
Please continue: - Invokana 300 mg daily in am - Trulicity 1.5 mg weekly  Add Cycloset 0.8 mg daily first thing in am for 1 week, then increase to 1.6 mg for another week, then increase to 2.4 mg daily if still needed.  Please return in 1.5 month with your sugar log.

## 2014-11-06 NOTE — Telephone Encounter (Signed)
Spoke with pt, she found a copay card online and was able to use it at the pharmacy.

## 2014-11-06 NOTE — Patient Instructions (Signed)
Given Link to Wellness packet for diabetes  1. Plan to eat 30-45 GM (2-3) servings of carbohydrate a meal and 15 GM for snacks. 2. Plan to check blood sugar 2 times a day either fasting or 1 -2hrs after a meal.  Goals of 80-130 fasting and 180 or less 1-2 hours after eating. 3. Plan to go to gym 3-4 days a week for 60 minutes.  Goal of 150 minutes a week. 4. Plan to complete EMMI programs by 12/06/14 5. Plan to return to Link to Wellness on 01/21/15 4PM

## 2014-11-07 LAB — METHYLMALONIC ACID, SERUM: Methylmalonic Acid, Quant: 162 nmol/L (ref 87–318)

## 2014-11-11 ENCOUNTER — Encounter: Payer: Self-pay | Admitting: Internal Medicine

## 2014-11-11 ENCOUNTER — Telehealth: Payer: Self-pay | Admitting: Internal Medicine

## 2014-11-11 NOTE — Telephone Encounter (Signed)
Patient called and would like to know if her lab results are available Also, the new medication cycloset is giving her headaches   Thank you

## 2014-11-11 NOTE — Telephone Encounter (Signed)
See note below about medication side effects. Pt notified of My-Chart message. Thanks!

## 2014-12-17 ENCOUNTER — Ambulatory Visit (INDEPENDENT_AMBULATORY_CARE_PROVIDER_SITE_OTHER): Payer: 59 | Admitting: Internal Medicine

## 2014-12-17 ENCOUNTER — Encounter: Payer: Self-pay | Admitting: Internal Medicine

## 2014-12-17 VITALS — BP 110/62 | HR 79 | Temp 97.6°F | Resp 12 | Wt 225.0 lb

## 2014-12-17 DIAGNOSIS — E063 Autoimmune thyroiditis: Secondary | ICD-10-CM | POA: Diagnosis not present

## 2014-12-17 DIAGNOSIS — E1165 Type 2 diabetes mellitus with hyperglycemia: Secondary | ICD-10-CM | POA: Diagnosis not present

## 2014-12-17 MED ORDER — DULAGLUTIDE 1.5 MG/0.5ML ~~LOC~~ SOAJ: 1.5000 mg | SUBCUTANEOUS | Status: DC

## 2014-12-17 MED ORDER — LEVOTHYROXINE SODIUM 125 MCG PO TABS: 125.0000 ug | ORAL_TABLET | Freq: Every day | ORAL | Status: DC

## 2014-12-17 NOTE — Progress Notes (Signed)
Patient ID: Sarah Chaney, female   DOB: Dec 03, 1960, 54 y.o.   MRN: [id number]  HPI: Sarah Chaney is a 54 y.o.-year-old female, returning for f/u for DM2, dx 2012, non-insulin-dependent, uncontrolled, without complications (? Peripheral neuropathy) and hypothyroidism. Last visit 1.5 mo ago.  DM2:  Last hemoglobin A1c was: Lab Results  Component Value Date   HGBA1C 7.9* 11/05/2014   HGBA1C 7.6* 08/06/2014   HGBA1C 8.2* 03/21/2014   Pt is on a regimen of: - Bydureon 2 mg weekly >> Trulicity 1.5 mg weekly - Invokana 300 mg daily - We tried Cycloset but she had headaches, and we switched to glipizide extended-release 2.5 mg in a.m. >> low CBGs >> stopped She was on Starlix for a short period of time.  Not on Metformin b/c GI upset (AP, not necessarily diarrhea). We tried to start Compounded Metformin but she could not afford it. We stopped Glipizide XL 5 mg and increased Invokana in 07/2013 as she had SEs from Glipizide >> hypoglycemic sxs >> was taken it sporadically She tolerates well the Invokana.  She is interested in gastric sleeve >> 4000$ deductible.  Pt checks her sugars 0-2x a day - slightly improved lately with a low carb diet (2 weeks): - am: 123-170s, 182 >> 129-141 >> 120-130 >> 142-176 >> 118-164 - 2h after b'fast: 145 >> n/c >> 135 >> n/c >> 187 - before lunch: 122, 144 >> 119, 130, 140 >> 125-180 >> 106, 115, 124 >> 106, 124-150 >> 137-181, 197 >> 124-150 >> n/c - 2h after lunch: 185s >> 141-216 >> 154, 170 >> 133, 159 >> 133, 136, 147 >>130-152 >> 124-171, 243x1 >> 124-171 - before dinner: 138-150 >> n/c >> 136-141 >> 97-140 >> 175 >> 102, 113 >> 98-124 >> 118-134 >> 87 (on Glipizide), 101-134 - bedtime: 125-141 >> 160-170 >> 151 >> 124, 154 >> n/c >> 147 >> n/c >> 141, 171-192 >> n/c No lows. Lowest sugar was 87 ; she has hypoglycemia awareness at 100. Highest sugar was 243 >> 180x1.  - no CKD, last BUN/creatinine:  Lab Results  Component Value Date   BUN 13 11/05/2014    CREATININE 0.75 11/05/2014  On Lisinopril. - she has fatty liver ds with transaminitis. AST/ALT stable per last check: Lab Results  Component Value Date   ALT 55* 11/05/2014   AST 45* 11/05/2014   ALKPHOS 108 11/05/2014   BILITOT 0.4 11/05/2014  - last set of lipids: Lab Results  Component Value Date   CHOL 200 03/21/2013   HDL 44.80 03/21/2013   LDLCALC 138* 03/21/2013   LDLDIRECT 138.8 06/29/2011   TRIG 86.0 03/21/2013   CHOLHDL 4 03/21/2013  Not on a statin b/c leg pain. - last eye exam was in 02/2014. No DR.  - no numbness and tingling in her feet (only 1 toe >> post surgery on foot). Foot exam performed in 04/2013.  Hypothyroidism. She takes Levothyroxine 125 mcg - fasting, in am - no b'fast usually (sometimes yoghurt, oatmeal) - with water - PPI later (Omeprazole 40) - takes this 4h later She now takes it correctly  Reviewed TFTs >> normal at last check: Received labs from [facility] (10/01/2014):  - TSH 3.306 - fT4 1.75 (0.61-1.12) - this is a little high, which is expected if the lab draw was close to LT4 dosing Lab Results  Component Value Date   TSH 4.94* 08/06/2014   TSH 1.41 03/21/2014   TSH 57.17* 12/31/2013   TSH 0.21* 11/20/2013  TSH 0.73 03/21/2013   FREET4 1.64* 08/06/2014   FREET4 1.25 03/21/2014   FREET4 1.75* 12/31/2013   FREET4 3.03* 11/20/2013   FREET4 1.00 05/03/2011  The TSH of 57 was in the setting of Dexilant.  ROS: Constitutional: no weight gain, + fatigue, + hot flushes Eyes: no blurry vision, no xerophthalmia ENT: no sore throat, no nodules palpated in throat, occasional dysphagia/no odynophagia, + tinnitus, + hoarseness Cardiovascular: no CP/SOB/palpitations/no leg swelling Respiratory: no cough/no SOB Gastrointestinal: no N/V/D/C/heartburn Musculoskeletal: no muscle/joint aches Skin: no rashes Neurological: no tremors/numbness/tingling/dizziness, + HA  I reviewed pt's medications, allergies, PMH, social hx, family  hx, and changes were documented in the history of present illness. Otherwise, unchanged from my initial visit note.  PE: BP 110/62 mmHg  Pulse 79  Temp(Src) 97.6 F (36.4 C) (Oral)  Resp 12  Wt 225 lb (102.059 kg)  SpO2 95% Body mass index is 34.22 kg/(m^2).  Wt Readings from Last 3 Encounters:  12/17/14 225 lb (102.059 kg)  11/05/14 227 lb 4.8 oz (103.103 kg)  11/05/14 226 lb (102.513 kg)  Constitutional: overweight, in NAD Eyes: PERRLA, EOMI, no exophthalmos ENT: dry mucous membranes, no thyromegaly, no cervical lymphadenopathy Cardiovascular: RRR, No MRG Respiratory: CTA B Gastrointestinal: abdomen soft, NT, ND, BS+ Musculoskeletal: no deformities, strength intact in all 4 Skin: moist, warm, no rashes  ASSESSMENT: 1. DM2, non-insulin-dependent, uncontrolled, without complications  2. Hypothyroidism  PLAN:  1. Patient with long-standing, with slightly improved control after starting her low carb diet. She did not tolerate Cycloset (HA) and Glipizide XL 2.5 mg (hypoglycemia). Will continue her low carb diet and current regimen. -  I suggested to:  Patient Instructions  Please continue: - Invokana 300 mg daily in am - Trulicity 1.5 mg weekly  Start B12 vitamin 1000 mcg daily.  Start vitamin D 2000 units daily.  Please return in 3 months with your sugar log.     - no SEs from Invokana - Reviewed most recent hemoglobin A1c, which was a little worse - Return to clinic in 3 months with sugar log >> check HbA1c then  2. Hypothyroidism - she appears to be taking Levothyroxine correctly, except, the PPI is too close to the levothyroxine. I advised her to move the PPI 4 hours from LT4 - reviewed recent TSH and fT4 from 09/2014 >> TSH normal, at 3.3 - Continue 125 g of LT4 daily  - We'll repeat thyroid tests today  3. Fatigue - we reviewed lab results from last visit: CMP normal, vitamin B12 low normal (381), methylmalonic acid normal, vitamin D low (19.89). - I  suggested to start over-the-counter vitamin B12 1000 g daily supplementation and also to thousand units of vitamin D daily >> did not start yet - will recheck levels at next visit

## 2014-12-17 NOTE — Patient Instructions (Addendum)
Please continue: - Invokana 300 mg daily in am - Trulicity 1.5 mg weekly  Start B12 vitamin 1000 mcg daily.  Start vitamin D 2000 units daily.  Please return in 3 months with your sugar log.

## 2015-01-06 ENCOUNTER — Other Ambulatory Visit: Payer: Self-pay | Admitting: Family Medicine

## 2015-01-06 NOTE — Telephone Encounter (Signed)
Px written for call in   

## 2015-01-06 NOTE — Telephone Encounter (Signed)
Rx called in as prescribed 

## 2015-01-06 NOTE — Telephone Encounter (Signed)
Electronic refill request, pt has a CPE scheduled on 05/13/15, last refilled on 09/12/14 #30 with 3 additional refills

## 2015-01-21 ENCOUNTER — Ambulatory Visit: Payer: Self-pay

## 2015-01-28 NOTE — Telephone Encounter (Signed)
This encounter was created in error - please disregard.

## 2015-02-02 ENCOUNTER — Other Ambulatory Visit: Payer: Self-pay | Admitting: Family Medicine

## 2015-02-02 ENCOUNTER — Other Ambulatory Visit: Payer: Self-pay | Admitting: Internal Medicine

## 2015-02-02 DIAGNOSIS — Z1231 Encounter for screening mammogram for malignant neoplasm of breast: Secondary | ICD-10-CM

## 2015-02-04 ENCOUNTER — Other Ambulatory Visit: Payer: Self-pay | Admitting: Family Medicine

## 2015-02-04 ENCOUNTER — Ambulatory Visit
Admission: RE | Admit: 2015-02-04 | Discharge: 2015-02-04 | Disposition: A | Discharge status: Home / Self Care | Payer: 59 | Source: Ambulatory Visit | Attending: Family Medicine | Admitting: Family Medicine

## 2015-02-04 DIAGNOSIS — Z1231 Encounter for screening mammogram for malignant neoplasm of breast: Secondary | ICD-10-CM | POA: Diagnosis present

## 2015-02-24 ENCOUNTER — Ambulatory Visit (INDEPENDENT_AMBULATORY_CARE_PROVIDER_SITE_OTHER)
Admission: RE | Admit: 2015-02-24 | Discharge: 2015-02-24 | Disposition: A | Discharge status: Home / Self Care | Payer: 59 | Source: Ambulatory Visit | Attending: Family Medicine | Admitting: Family Medicine

## 2015-02-24 ENCOUNTER — Encounter: Payer: Self-pay | Admitting: Family Medicine

## 2015-02-24 ENCOUNTER — Ambulatory Visit (INDEPENDENT_AMBULATORY_CARE_PROVIDER_SITE_OTHER): Payer: 59 | Admitting: Family Medicine

## 2015-02-24 VITALS — BP 110/70 | HR 66 | Temp 98.2°F | Ht 67.5 in | Wt 225.8 lb

## 2015-02-24 DIAGNOSIS — R0789 Other chest pain: Secondary | ICD-10-CM

## 2015-02-24 MED ORDER — CYCLOBENZAPRINE HCL 10 MG PO TABS: 5.0000 mg | ORAL_TABLET | Freq: Three times a day (TID) | ORAL | Status: DC | PRN

## 2015-02-24 NOTE — Progress Notes (Signed)
Subjective:    Patient ID: Sarah Chaney, female    DOB: [birth year], 54 y.o.   MRN: [id number]  HPI Here with muscle pain  Sunday- shoulders were hurting  By mid day yesterday- dull pain in her chest (mid) and radiates into her back   (started after eating lunch)  No new activity or exercise or lifting or pull/push   Is miserable  Used warm compresses through the night  Took some ibuprofen this am 800 -did not help much  No fever  No cough   Stomach has been upset  Some loose stool with low abd cramping  No heartburn -if she takes her prilosec   Still a non smoker    Patient Active Problem List   Diagnosis Date Noted  . Palpitations 08/20/2014  . RUQ pain 07/24/2014  . Dizzinesses 04/25/2014  . Headache(784.0) 02/20/2014  . Lightheaded 02/13/2014  . Pain in joint, ankle and foot 12/18/2013  . Left eye pain 05/29/2013  . Abnormal transaminases 10/08/2012  . Hashimoto's thyroiditis 10/03/2012  . History of pyelonephritis 06/24/2012  . Routine general medical examination at a health care facility 06/26/2011  . Other screening mammogram 04/22/2011  . RESTLESS LEG SYNDROME 08/19/2010  . Migraine with aura 08/19/2010  . DM2 (diabetes mellitus, type 2) 01/26/2010  . 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 UNSPECIFIED NEURALGIA NEURITIS AND RADICULITIS 06/30/2009  . SEIZURES, HX OF 05/20/2009  . ROTATOR CUFF SYNDROME, LEFT 05/05/2009  . POLYARTHRITIS 07/03/2008  . PERIMENOPAUSAL STATUS 07/03/2008  . PURE HYPERCHOLESTEROLEMIA 05/25/2007  . INSOMNIA, CHRONIC 03/23/2007  . PAIN IN JOINT, MULTIPLE SITES 03/23/2007  . OBESITY 03/20/2007  . ANEMIA-NOS 03/20/2007  . Essential hypertension 03/20/2007  . GERD 03/20/2007  . IBS 03/20/2007  . FATTY LIVER DISEASE 03/20/2007  . POSITIVE PPD 03/20/2007  . COLONIC POLYPS, HX OF 03/20/2007  . TOBACCO USE, QUIT 03/20/2007   Past Medical History  Diagnosis Date  . Anemia, unspecified   . Chondromalacia of patella   . Gastric  polyps   . GERD (gastroesophageal reflux disease)   . Headache(784.0)   . Unspecified essential hypertension   . Unspecified hypothyroidism   . Irritable bowel syndrome   . Persistent disorder of initiating or maintaining sleep   . Lateral epicondylitis  of elbow   . Variants of migraine, not elsewhere classified, without mention of intractable migraine without mention of status migrainosus   . Obesity, unspecified   . Palpitations   . Unspecified polyarthropathy or polyarthritis, multiple sites   . 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 Pure hypercholesterolemia   . Restless legs syndrome (RLS)   . Disorders of bursae and tendons in shoulder region, unspecified   . Tachycardia, unspecified   . Personal history of tobacco use, presenting hazards to health   . Neuralgia, neuritis, and radiculitis, unspecified   . Diverticulosis   . Adenomatous colon polyp 1997  . Diabetes mellitus    Past Surgical History  Procedure Laterality Date  . Popliteal synovial cyst excision  12/2003  . Esophagogastroduodenoscopy  12/2001  . Colonoscopy  12/2001    Negative  . Polypectomy  12/2001  . Abd [place]  12/2002    Fatty liver  . Esophagogastroduodenoscopy  11/2007    nml  . Abd [place]  05/2010    Fatty liver, normal gall bladder and pancreas  . Exercise treadmill  5/13    normal  . Reduction mammaplasty Bilateral 1991  . Breast biopsy Right 1992  excisional negative biopsy   History  Substance Use Topics  . Smoking status: Former Smoker -- 0.30 packs/day for 32 years    Types: Cigarettes    Quit date: 11/08/2011  . Smokeless tobacco: Never Used  . Alcohol Use: 0.0 oz/week    0 Standard drinks or equivalent per week     Comment: rare   Family History  Problem Relation Age of Onset  . Diabetes type II Father   . Diabetes Mother   . Hyperlipidemia Mother   . Diabetes      neice  . Breast cancer Maternal Aunt 65   Allergies  Allergen Reactions    . Carbamazepine     REACTION: hives  . Cycloset [Bromocriptine] Other (See Comments)    Severe headache  . Diltiazem Hcl     REACTION: chills, fever  . Metformin And Related Other (See Comments)    GI   . Nifedipine   . Verapamil     REACTION: edema   Current Outpatient Prescriptions on File Prior to Visit  Medication Sig Dispense Refill  . aspirin 81 MG tablet Take 162 mg by mouth daily.    Marland Kitchen BAYER MICROLET LANCETS lancets Use to test blood sugar 4 times daily as instructed. 200 each 10  . Biotin 10 MG TABS Take 1 tablet by mouth daily.    . Blood Glucose Monitoring Suppl (BAYER CONTOUR NEXT MONITOR) W/DEVICE KIT Use to test blood sugar 4 times daily. 1 kit 0  . Dulaglutide (TRULICITY) 1.5 SA/6.3KZ SOPN Inject 1.5 mg into the skin once a week. 4 pen 5  . glucose blood (BAYER CONTOUR NEXT TEST) test strip Use to test blood sugar 4 times daily as instructed. 125 each 11  . INVOKANA 300 MG TABS tablet TAKE 1 TABLET BY MOUTH ONCE DAILY IN THE MORNING 90 tablet 0  . levothyroxine (SYNTHROID, LEVOTHROID) 125 MCG tablet Take 1 tablet (125 mcg total) by mouth daily before breakfast. 90 tablet 1  . lisinopril (PRINIVIL,ZESTRIL) 10 MG tablet Take 1 tablet (10 mg total) by mouth daily. 30 tablet 6  . metoprolol (LOPRESSOR) 50 MG tablet Take 1 tablet (50 mg total) by mouth 2 (two) times daily. 60 tablet 3  . omeprazole (PRILOSEC) 40 MG capsule Take 1 capsule (40 mg total) by mouth 2 (two) times daily. 60 capsule 11  . SUMAtriptan (IMITREX) 50 MG tablet Take 1 tablet (50 mg total) by mouth once. May repeat in 2 hours if headache persists or recurs. 10 tablet 0  . zolpidem (AMBIEN) 10 MG tablet TAKE 1 TABLET BY MOUTH NIGHTLY AT BEDTIME AS NEEDED FOR SLEEP 30 tablet 3  . ezetimibe (ZETIA) 10 MG tablet Take 1 tablet (10 mg total) by mouth daily. (Patient not taking: Reported on 02/24/2015) 30 tablet 11  . meloxicam (MOBIC) 15 MG tablet      No current facility-administered medications on file prior to  visit.    Review of Systems Review of Systems  Constitutional: Negative for fever, appetite change,  and unexpected weight change.  Eyes: Negative for pain and visual disturbance.  Respiratory: Negative for cough and shortness of breath.  pos for chest wall pain and tenderness , pos for pleuritic cp  Cardiovascular: Negative for palpitations  Neg for pedal edema ,PND ,orthopnea  Gastrointestinal: Negative for nausea, and constipation. neg for dark stool or blood in stool Genitourinary: Negative for urgency and frequency.  Skin: Negative for pallor or rash   Neurological: Negative for weakness, light-headedness, numbness and  headaches.  Hematological: Negative for adenopathy. Does not bruise/bleed easily.  Psychiatric/Behavioral: Negative for dysphoric mood. The patient is not nervous/anxious.         Objective:   Physical Exam  Constitutional: She appears well-developed and well-nourished. No distress.  Obese and uncomfortable appearing   HENT:  Head: Normocephalic and atraumatic.  Mouth/Throat: Oropharynx is clear and moist.  Eyes: Conjunctivae and EOM are normal. Pupils are equal, round, and reactive to light. No scleral icterus.  Neck: Normal range of motion. Neck supple. No JVD present. Carotid bruit is not present. No tracheal deviation present. No thyromegaly present.  Cardiovascular: Normal rate, regular rhythm and normal heart sounds.   Pulmonary/Chest: Effort normal and breath sounds normal. No respiratory distress. She has no wheezes. She has no rales. She exhibits tenderness.  Marked CW pain over sternum and anterior chest  No crepitus or skin changes   No rales/rhonchi or wheeze Good air exch   Abdominal: Soft. Bowel sounds are normal. She exhibits no distension and no mass. There is no tenderness. There is no rebound, no guarding and negative Murphy's sign.  Musculoskeletal: She exhibits no edema or tenderness.  No LE edema or tenderness   Lymphadenopathy:    She has  no cervical adenopathy.  Neurological: She is alert. She has normal reflexes. No cranial nerve deficit. She exhibits normal muscle tone. Coordination normal.  Skin: Skin is warm and dry. No rash noted. No erythema. No pallor.  Psychiatric: She has a normal mood and affect.          Assessment & Plan:   Problem List Items Addressed This Visit    Chest wall pain - Primary    Mid chest/sore/pleuritic pain with nl vitals and no sob  Suspect costochondritis  cxr now  Ibuprofen 800 tid prn/ heat /flexeril Deep breaths to prevent atelectasis  Update after film reading   Handout given as well   If worse/sob -will update and go to ED      Relevant Orders   DG Chest 2 View (Completed)

## 2015-02-24 NOTE — Progress Notes (Signed)
Pre visit review using our clinic review tool, if applicable. No additional management support is needed unless otherwise documented below in the visit note. 

## 2015-02-24 NOTE — Assessment & Plan Note (Signed)
Mid chest/sore/pleuritic pain with nl vitals and no sob  Suspect costochondritis  cxr now  Ibuprofen 800 tid prn/ heat /flexeril Deep breaths to prevent atelectasis  Update after film reading   Handout given as well   If worse/sob -will update and go to ED

## 2015-02-24 NOTE — Patient Instructions (Signed)
Use heat on chest  Continue ibuprofen 800 with food up to three times daily  Chest xray now - we will get back to you with a reading  If cough/ fever/new symptoms/short of breath - let us know/ go to ER if necessary  Flexeril as needed when not working or driving

## 2015-03-09 ENCOUNTER — Other Ambulatory Visit: Payer: Self-pay

## 2015-03-09 MED ORDER — LISINOPRIL 10 MG PO TABS: 10.0000 mg | ORAL_TABLET | Freq: Every day | ORAL | Status: DC

## 2015-03-09 NOTE — Telephone Encounter (Signed)
Refill sent for lisinopril  

## 2015-03-18 ENCOUNTER — Ambulatory Visit: Payer: 59 | Admitting: Internal Medicine

## 2015-04-07 ENCOUNTER — Other Ambulatory Visit: Payer: Self-pay | Admitting: *Deleted

## 2015-04-07 MED ORDER — OMEPRAZOLE 40 MG PO CPDR: 40.0000 mg | DELAYED_RELEASE_CAPSULE | Freq: Two times a day (BID) | ORAL | Status: DC

## 2015-04-29 ENCOUNTER — Ambulatory Visit: Payer: 59 | Admitting: Internal Medicine

## 2015-05-01 ENCOUNTER — Other Ambulatory Visit: Payer: Self-pay | Admitting: Family Medicine

## 2015-05-01 NOTE — Telephone Encounter (Signed)
Received on 05/01/15, not due until 05/07/15, will send Rx request to Dr. Glori Bickers on Monday

## 2015-05-04 NOTE — Telephone Encounter (Signed)
Electronic refill request last refilled on 01/06/15 #30 with 3 additional refills, pt has CPE scheduled on 05/13/15, please advise

## 2015-05-04 NOTE — Telephone Encounter (Signed)
Patient calling to check status of refill request.

## 2015-05-04 NOTE — Telephone Encounter (Signed)
Px written for call in   

## 2015-05-05 NOTE — Telephone Encounter (Signed)
Rx called in as prescribed, and pt notified  

## 2015-05-05 NOTE — Telephone Encounter (Signed)
Pt left v/m requesting cb about status of refill.

## 2015-05-13 ENCOUNTER — Encounter: Payer: Self-pay | Admitting: Family Medicine

## 2015-05-13 ENCOUNTER — Other Ambulatory Visit (HOSPITAL_COMMUNITY)
Admission: RE | Admit: 2015-05-13 | Discharge: 2015-05-13 | Disposition: A | Discharge status: Home / Self Care | Payer: 59 | Source: Ambulatory Visit | Attending: Family Medicine | Admitting: Family Medicine

## 2015-05-13 ENCOUNTER — Ambulatory Visit (INDEPENDENT_AMBULATORY_CARE_PROVIDER_SITE_OTHER): Payer: 59 | Admitting: Family Medicine

## 2015-05-13 VITALS — BP 106/58 | HR 80 | Temp 98.3°F | Ht 67.0 in | Wt 220.2 lb

## 2015-05-13 DIAGNOSIS — E1165 Type 2 diabetes mellitus with hyperglycemia: Secondary | ICD-10-CM

## 2015-05-13 DIAGNOSIS — I10 Essential (primary) hypertension: Secondary | ICD-10-CM

## 2015-05-13 DIAGNOSIS — Z01419 Encounter for gynecological examination (general) (routine) without abnormal findings: Secondary | ICD-10-CM | POA: Diagnosis present

## 2015-05-13 DIAGNOSIS — Z1151 Encounter for screening for human papillomavirus (HPV): Secondary | ICD-10-CM | POA: Insufficient documentation

## 2015-05-13 DIAGNOSIS — Z23 Encounter for immunization: Secondary | ICD-10-CM

## 2015-05-13 DIAGNOSIS — Z0000 Encounter for general adult medical examination without abnormal findings: Secondary | ICD-10-CM | POA: Diagnosis not present

## 2015-05-13 DIAGNOSIS — E7800 Pure hypercholesterolemia, unspecified: Secondary | ICD-10-CM

## 2015-05-13 LAB — CBC WITH DIFFERENTIAL/PLATELET
BASOS ABS: 0.1 10*3/uL (ref 0.0–0.1)
Basophils Relative: 0.5 % (ref 0.0–3.0)
Eosinophils Absolute: 0.3 10*3/uL (ref 0.0–0.7)
Eosinophils Relative: 2.9 % (ref 0.0–5.0)
HEMATOCRIT: 47.8 % — AB (ref 36.0–46.0)
Hemoglobin: 15.6 g/dL — ABNORMAL HIGH (ref 12.0–15.0)
LYMPHS ABS: 2.4 10*3/uL (ref 0.7–4.0)
LYMPHS PCT: 22.9 % (ref 12.0–46.0)
MCHC: 32.8 g/dL (ref 30.0–36.0)
MCV: 85 fl (ref 78.0–100.0)
MONOS PCT: 7.8 % (ref 3.0–12.0)
Monocytes Absolute: 0.8 10*3/uL (ref 0.1–1.0)
NEUTROS PCT: 65.9 % (ref 43.0–77.0)
Neutro Abs: 6.9 10*3/uL (ref 1.4–7.7)
Platelets: 249 10*3/uL (ref 150.0–400.0)
RBC: 5.62 Mil/uL — AB (ref 3.87–5.11)
RDW: 14.6 % (ref 11.5–15.5)
WBC: 10.4 10*3/uL (ref 4.0–10.5)

## 2015-05-13 LAB — COMPREHENSIVE METABOLIC PANEL
ALK PHOS: 83 U/L (ref 39–117)
ALT: 50 U/L — AB (ref 0–35)
AST: 39 U/L — ABNORMAL HIGH (ref 0–37)
Albumin: 4.1 g/dL (ref 3.5–5.2)
BILIRUBIN TOTAL: 0.5 mg/dL (ref 0.2–1.2)
BUN: 20 mg/dL (ref 6–23)
CO2: 28 mEq/L (ref 19–32)
CREATININE: 0.72 mg/dL (ref 0.40–1.20)
Calcium: 9.8 mg/dL (ref 8.4–10.5)
Chloride: 103 mEq/L (ref 96–112)
GFR: 89.52 mL/min (ref >60.00)
GLUCOSE: 173 mg/dL — AB (ref 70–99)
Potassium: 4.4 mEq/L (ref 3.5–5.1)
Sodium: 139 mEq/L (ref 135–145)
TOTAL PROTEIN: 7 g/dL (ref 6.0–8.3)

## 2015-05-13 LAB — LIPID PANEL
CHOL/HDL RATIO: 4
Cholesterol: 216 mg/dL — ABNORMAL HIGH (ref 0–200)
HDL: 51.6 mg/dL (ref >39.00)
LDL Cholesterol: 141 mg/dL — ABNORMAL HIGH (ref 0–99)
NONHDL: 164.86
TRIGLYCERIDES: 121 mg/dL (ref 0.0–149.0)
VLDL: 24.2 mg/dL (ref 0.0–40.0)

## 2015-05-13 LAB — TSH: TSH: 14.42 u[IU]/mL — ABNORMAL HIGH (ref 0.35–4.50)

## 2015-05-13 NOTE — Progress Notes (Signed)
Subjective:    Patient ID: Sarah Chaney, female    DOB: [birth year], 54 y.o.   MRN: [id number]  HPI Here for health maintenance exam and to review chronic medical problems    Wt is down 5 lb with bmi of 34 Trying to work on it  More exercise  She also had a stomach bug 2 wk ago - still a little nauseated  bmi of 12 In obese range   Has not had a pneumonia vaccine  Is interested  Is a diabetic   Flu shot - Aug   Not interested in HIV screening  Hep C neg 3/14- and she has had hep B shots twice   Pap  - due 7/11  Has not had a period in 4-5 years  Never had abn pap  Has a lot of discomfort with intercourse - even with a lubricant   opth exam-she still has to schedule one  Mm 7/16 nl  No lumps on self exam   Td 7/11  colonsc 5/15 - 5 year recall   Needs her labs   Past had a low D level - Dr Cruzita Lederer follows  Not taking D - she will go get it  Seeing her for DM  Is about the same -not the best eater during the day  Is on trulicity  Glipizide caused hypoglycemia   bp is stable today  No cp or palpitations or headaches or edema  No side effects to medicines  BP Readings from Last 3 Encounters:  05/13/15 106/58  02/24/15 110/70  12/17/14 110/62      Due for labs and lipids    Patient Active Problem List   Diagnosis Date Noted  . Encounter for routine gynecological examination 05/13/2015  . Chest wall pain 02/24/2015  . Palpitations 08/20/2014  . Dizzinesses 04/25/2014  . Headache(784.0) 02/20/2014  . Lightheaded 02/13/2014  . Pain in joint, ankle and foot 12/18/2013  . Left eye pain 05/29/2013  . Abnormal transaminases 10/08/2012  . Hashimoto's thyroiditis 10/03/2012  . History of pyelonephritis 06/24/2012  . Routine general medical examination at a health care facility 06/26/2011  . Other screening mammogram 04/22/2011  . RESTLESS LEG SYNDROME 08/19/2010  . Migraine with aura 08/19/2010  . DM2 (diabetes mellitus, type 2) ([place]) 01/26/2010  .  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 UNSPECIFIED NEURALGIA NEURITIS AND RADICULITIS 06/30/2009  . SEIZURES, HX OF 05/20/2009  . ROTATOR CUFF SYNDROME, LEFT 05/05/2009  . POLYARTHRITIS 07/03/2008  . PERIMENOPAUSAL STATUS 07/03/2008  . PURE HYPERCHOLESTEROLEMIA 05/25/2007  . INSOMNIA, CHRONIC 03/23/2007  . PAIN IN JOINT, MULTIPLE SITES 03/23/2007  . OBESITY 03/20/2007  . ANEMIA-NOS 03/20/2007  . Essential hypertension 03/20/2007  . GERD 03/20/2007  . IBS 03/20/2007  . FATTY LIVER DISEASE 03/20/2007  . POSITIVE PPD 03/20/2007  . COLONIC POLYPS, HX OF 03/20/2007  . TOBACCO USE, QUIT 03/20/2007   Past Medical History  Diagnosis Date  . Anemia, unspecified   . Chondromalacia of patella   . Gastric polyps   . GERD (gastroesophageal reflux disease)   . Headache(784.0)   . Unspecified essential hypertension   . Unspecified hypothyroidism   . Irritable bowel syndrome   . Persistent disorder of initiating or maintaining sleep   . Lateral epicondylitis  of elbow   . Variants of migraine, not elsewhere classified, without mention of intractable migraine without mention of status migrainosus   . Obesity, unspecified   . Palpitations   . Unspecified polyarthropathy or polyarthritis, multiple sites   .  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 Pure hypercholesterolemia   . Restless legs syndrome (RLS)   . Disorders of bursae and tendons in shoulder region, unspecified   . Tachycardia, unspecified   . Personal history of tobacco use, presenting hazards to health   . Neuralgia, neuritis, and radiculitis, unspecified   . Diverticulosis   . Adenomatous colon polyp 1997  . Diabetes mellitus [facility])    Past Surgical History  Procedure Laterality Date  . Popliteal synovial cyst excision  12/2003  . Esophagogastroduodenoscopy  12/2001  . Colonoscopy  12/2001    Negative  . Polypectomy  12/2001  . Abd [place]  12/2002    Fatty liver  .  Esophagogastroduodenoscopy  11/2007    nml  . Abd [place]  05/2010    Fatty liver, normal gall bladder and pancreas  . Exercise treadmill  5/13    normal  . Reduction mammaplasty Bilateral 1991  . Breast biopsy Right 1992    excisional negative biopsy   Social History  Substance Use Topics  . Smoking status: Former Smoker -- 0.30 packs/day for 32 years    Types: Cigarettes    Quit date: 11/08/2011  . Smokeless tobacco: Never Used  . Alcohol Use: 0.0 oz/week    0 Standard drinks or equivalent per week     Comment: rare   Family History  Problem Relation Age of Onset  . Diabetes type II Father   . Diabetes Mother   . Hyperlipidemia Mother   . Diabetes      neice  . Breast cancer Maternal Aunt 65   Allergies  Allergen Reactions  . Carbamazepine     REACTION: hives  . Cycloset [Bromocriptine] Other (See Comments)    Severe headache  . Diltiazem Hcl     REACTION: chills, fever  . Metformin And Related Other (See Comments)    GI   . Nifedipine   . Verapamil     REACTION: edema   Current Outpatient Prescriptions on File Prior to Visit  Medication Sig Dispense Refill  . aspirin 81 MG tablet Take 162 mg by mouth daily.    Marland Kitchen BAYER MICROLET LANCETS lancets Use to test blood sugar 4 times daily as instructed. 200 each 10  . Biotin 10 MG TABS Take 1 tablet by mouth daily.    . Blood Glucose Monitoring Suppl (BAYER CONTOUR NEXT MONITOR) W/DEVICE KIT Use to test blood sugar 4 times daily. 1 kit 0  . Dulaglutide (TRULICITY) 1.5 EZ/6.6QH SOPN Inject 1.5 mg into the skin once a week. 4 pen 5  . glucose blood (BAYER CONTOUR NEXT TEST) test strip Use to test blood sugar 4 times daily as instructed. 125 each 11  . INVOKANA 300 MG TABS tablet TAKE 1 TABLET BY MOUTH ONCE DAILY IN THE MORNING 90 tablet 0  . levothyroxine (SYNTHROID, LEVOTHROID) 125 MCG tablet Take 1 tablet (125 mcg total) by mouth daily before breakfast. 90 tablet 1  . lisinopril (PRINIVIL,ZESTRIL) 10 MG tablet Take 1 tablet  (10 mg total) by mouth daily. 30 tablet 6  . metoprolol (LOPRESSOR) 50 MG tablet Take 1 tablet (50 mg total) by mouth 2 (two) times daily. 60 tablet 3  . omeprazole (PRILOSEC) 40 MG capsule Take 1 capsule (40 mg total) by mouth 2 (two) times daily. 60 capsule 1  . SUMAtriptan (IMITREX) 50 MG tablet Take 1 tablet (50 mg total) by mouth once. May repeat in 2 hours if headache persists or  recurs. 10 tablet 0  . zolpidem (AMBIEN) 10 MG tablet TAKE 1 TABLET BY MOUTH NIGHTLY AT BEDTIME AS NEEDED FOR SLEEP 30 tablet 3   No current facility-administered medications on file prior to visit.     Review of Systems Review of Systems  Constitutional: Negative for fever, appetite change, fatigue and unexpected weight change.  Eyes: Negative for pain and visual disturbance.  Respiratory: Negative for cough and shortness of breath.   Cardiovascular: Negative for cp or palpitations    Gastrointestinal: Negative for nausea, diarrhea and constipation.  Genitourinary: Negative for urgency and frequency. pos for vaginal dryness Skin: Negative for pallor or rash   MSK pos for L hip pain and swelling at the trochanter (on ibuprofen and tramadol)- hurts to lie on  Neurological: Negative for weakness, light-headedness, numbness and headaches.  Hematological: Negative for adenopathy. Does not bruise/bleed easily.  Psychiatric/Behavioral: Negative for dysphoric mood. The patient is not nervous/anxious.         Objective:   Physical Exam  Constitutional: She appears well-developed and well-nourished. No distress.  obese and well appearing   HENT:  Head: Normocephalic and atraumatic.  Right Ear: External ear normal.  Left Ear: External ear normal.  Mouth/Throat: Oropharynx is clear and moist.  Eyes: Conjunctivae and EOM are normal. Pupils are equal, round, and reactive to light. No scleral icterus.  Neck: Normal range of motion. Neck supple. No JVD present. Carotid bruit is not present. No thyromegaly present.    Cardiovascular: Normal rate, regular rhythm, normal heart sounds and intact distal pulses.  Exam reveals no gallop.   Pulmonary/Chest: Effort normal and breath sounds normal. No respiratory distress. She has no wheezes. She exhibits no tenderness.  Abdominal: Soft. Bowel sounds are normal. She exhibits no distension, no abdominal bruit and no mass. There is no tenderness.  Genitourinary: Vagina normal and uterus normal. No breast swelling, tenderness, discharge or bleeding. There is no rash, tenderness or lesion on the right labia. There is no rash, tenderness or lesion on the left labia. Uterus is not enlarged and not tender. Cervix exhibits no motion tenderness, no discharge and no friability. Right adnexum displays no mass, no tenderness and no fullness. Left adnexum displays no mass, no tenderness and no fullness. No erythema or bleeding in the vagina. No vaginal discharge found.  Breast exam: No mass, nodules, thickening, tenderness, bulging, retraction, inflamation, nipple discharge or skin changes noted.  No axillary or clavicular LA.     Atrophic vaginal mucosa noted   Musculoskeletal: She exhibits no edema or tenderness.  Tender over L greater trochanter with some swelling of that general area   Lymphadenopathy:    She has no cervical adenopathy.  Neurological: She is alert. She has normal reflexes. No cranial nerve deficit. She exhibits normal muscle tone. Coordination normal.  Skin: Skin is warm and dry. No rash noted. No erythema. No pallor.  Psychiatric: She has a normal mood and affect.          Assessment & Plan:

## 2015-05-13 NOTE — Assessment & Plan Note (Signed)
Followed by endocrine  Reminded to get her eye exam  Lack of time for self care is problematic

## 2015-05-13 NOTE — Assessment & Plan Note (Signed)
Reviewed health habits including diet and exercise and skin cancer prevention Reviewed appropriate screening tests for age  Also reviewed health mt list, fam hx and immunization status , as well as social and family history   See HPI Lab today  Pap done today  Pneumonia vaccine today (pneumovax 23)  Get your eye exam  Start on vitamin D3  2000-4000 iu daily

## 2015-05-13 NOTE — Assessment & Plan Note (Signed)
bp in fair control at this time  BP Readings from Last 1 Encounters:  05/13/15 106/58   No changes needed Disc lifstyle change with low sodium diet and exercise   Labs today

## 2015-05-13 NOTE — Assessment & Plan Note (Signed)
Routine exam and pap done today No c/o except for post menopausal vaginal dryness

## 2015-05-13 NOTE — Assessment & Plan Note (Signed)
Lab today Diet is fair  Disc goals for lipids and reasons to control them Rev low sat fat diet in detail

## 2015-05-13 NOTE — Progress Notes (Signed)
Pre visit review using our clinic review tool, if applicable. No additional management support is needed unless otherwise documented below in the visit note. 

## 2015-05-13 NOTE — Patient Instructions (Signed)
Pap done today  Pneumonia vaccine today (pneumovax 23)  Get your eye exam  Start on vitamin D3  2000-4000 iu daily  Labs today

## 2015-05-14 LAB — CYTOLOGY - PAP

## 2015-05-19 ENCOUNTER — Telehealth: Payer: Self-pay | Admitting: Cardiovascular Disease

## 2015-05-19 NOTE — Telephone Encounter (Signed)
3 attempts made to schedule OD fu from recall.  LMOV to call office for scheduling  ° ° °Deleting Recall.   °

## 2015-05-21 ENCOUNTER — Other Ambulatory Visit: Payer: Self-pay | Admitting: Specialist

## 2015-05-21 DIAGNOSIS — M5416 Radiculopathy, lumbar region: Secondary | ICD-10-CM

## 2015-05-27 ENCOUNTER — Ambulatory Visit
Admission: RE | Admit: 2015-05-27 | Discharge: 2015-05-27 | Disposition: A | Discharge status: Home / Self Care | Payer: 59 | Source: Ambulatory Visit | Attending: Specialist | Admitting: Specialist

## 2015-05-27 DIAGNOSIS — M4806 Spinal stenosis, lumbar region: Secondary | ICD-10-CM | POA: Diagnosis not present

## 2015-05-27 DIAGNOSIS — M5126 Other intervertebral disc displacement, lumbar region: Secondary | ICD-10-CM | POA: Diagnosis not present

## 2015-05-27 DIAGNOSIS — M5186 Other intervertebral disc disorders, lumbar region: Secondary | ICD-10-CM | POA: Diagnosis not present

## 2015-05-27 DIAGNOSIS — M5136 Other intervertebral disc degeneration, lumbar region: Secondary | ICD-10-CM | POA: Diagnosis not present

## 2015-05-27 DIAGNOSIS — M5416 Radiculopathy, lumbar region: Secondary | ICD-10-CM

## 2015-05-29 ENCOUNTER — Other Ambulatory Visit: Payer: Self-pay | Admitting: Internal Medicine

## 2015-05-30 ENCOUNTER — Ambulatory Visit: Payer: 59

## 2015-06-01 ENCOUNTER — Other Ambulatory Visit: Payer: Self-pay | Admitting: Cardiovascular Disease

## 2015-06-11 ENCOUNTER — Ambulatory Visit (INDEPENDENT_AMBULATORY_CARE_PROVIDER_SITE_OTHER): Payer: 59 | Admitting: Internal Medicine

## 2015-06-11 ENCOUNTER — Encounter: Payer: Self-pay | Admitting: Internal Medicine

## 2015-06-11 ENCOUNTER — Other Ambulatory Visit (INDEPENDENT_AMBULATORY_CARE_PROVIDER_SITE_OTHER): Payer: 59 | Admitting: *Deleted

## 2015-06-11 VITALS — BP 104/62 | HR 83 | Temp 97.4°F | Resp 12 | Wt 221.0 lb

## 2015-06-11 DIAGNOSIS — E063 Autoimmune thyroiditis: Secondary | ICD-10-CM | POA: Diagnosis not present

## 2015-06-11 DIAGNOSIS — E1165 Type 2 diabetes mellitus with hyperglycemia: Secondary | ICD-10-CM | POA: Diagnosis not present

## 2015-06-11 LAB — POCT GLYCOSYLATED HEMOGLOBIN (HGB A1C): HEMOGLOBIN A1C: 7.5

## 2015-06-11 MED ORDER — GLUCOPHAGE XR 500 MG PO TB24: 500.0000 mg | ORAL_TABLET | Freq: Every day | ORAL | Status: DC

## 2015-06-11 NOTE — Progress Notes (Signed)
Patient ID: Sarah Chaney, female   DOB: 08/23/1960, 54 y.o.   MRN: [id number]  HPI: Sarah Chaney is a 54 y.o.-year-old female, returning for f/u for DM2, dx 2012, non-insulin-dependent, uncontrolled, without complications (? Peripheral neuropathy) and hypothyroidism. Last visit 6 mo ago.  She has laryngitis.  Her daughter moved with her with 2 small children.  DM2:  Last hemoglobin A1c was: Lab Results  Component Value Date   HGBA1C 7.9* 11/05/2014   HGBA1C 7.6* 08/06/2014   HGBA1C 8.2* 03/21/2014   Pt is on a regimen of: - Bydureon 2 mg weekly >> Trulicity 1.5 mg weekly - Invokana 300 mg daily  We tried Cycloset but she had headaches, and we switched to glipizide extended-release 2.5 mg in a.m. >> low CBGs >> stopped She was on Starlix for a short period of time. Not on Metformin b/c GI upset (AP, not necessarily diarrhea). We tried to start Compounded Metformin but she could not afford it. We stopped Glipizide XL 5 mg and increased Invokana in 07/2013 as she had SEs from Glipizide >> hypoglycemic sxs >> was taken it sporadically She tolerates well the Invokana.  She is interested in gastric sleeve >> 4000$ deductible.  Pt checks her sugars 0-2x a day: - am: 123-170s, 182 >> 129-141 >> 120-130 >> 142-176 >> 118-164 >> 170-180s - 2h after b'fast: 145 >> n/c >> 135 >> n/c >> 187 >> n/c >> 183 - before lunch: 125-180 >> 106, 115, 124 >> 106, 124-150 >> 137-181, 197 >> 124-150 >> n/c >> 129 - 2h after lunch: 154, 170 >> 133, 159 >> 133, 136, 147 >>130-152 >> 124-171, 243x1 >> 124-171 >> 178-190, 215 - before dinner: 136-141 >> 97-140 >> 175 >> 102, 113 >> 98-124 >> 118-134 >> 87 (on Glipizide), 101-134 >> 120-140 - bedtime: 125-141 >> 160-170 >> 151 >> 124, 154 >> n/c >> 147 >> n/c >> 141, 171-192 >> n/c >> <200 No lows. Lowest sugar was 87 >> 120 ; she has hypoglycemia awareness at 100. Highest sugar was 243 >> 180x1. >> 200s  - no CKD, last BUN/creatinine:  Lab Results  Component  Value Date   BUN 20 05/13/2015   CREATININE 0.72 05/13/2015  On Lisinopril. - she has fatty liver ds with transaminitis. AST/ALT stable per last check: Lab Results  Component Value Date   ALT 50* 05/13/2015   AST 39* 05/13/2015   ALKPHOS 83 05/13/2015   BILITOT 0.5 05/13/2015  - last set of lipids: Lab Results  Component Value Date   CHOL 216* 05/13/2015   HDL 51.60 05/13/2015   LDLCALC 141* 05/13/2015   LDLDIRECT 138.8 06/29/2011   TRIG 121.0 05/13/2015   CHOLHDL 4 05/13/2015  Not on a statin b/c leg pain. - last eye exam was in 02/2014. No DR.  - no numbness and tingling in her feet (only 1 toe >> post surgery on foot). Foot exam performed in 04/2013.  Hypothyroidism. She takes Levothyroxine 125 mcg - fasting, in am >> moved it at 8 pm (1-2 hrs after dinner) - !??? - no b'fast usually (sometimes yoghurt, oatmeal) - with water - PPI (Omeprazole 40) in am   Reviewed TFTs >> normal at last check: Lab Results  Component Value Date   TSH 14.42* 05/13/2015   TSH 4.94* 08/06/2014   TSH 1.41 03/21/2014   TSH 57.17* 12/31/2013   TSH 0.21* 11/20/2013   FREET4 1.64* 08/06/2014   FREET4 1.25 03/21/2014   FREET4 1.75* 12/31/2013   FREET4  3.03* 11/20/2013   FREET4 1.00 05/03/2011  The TSH of 57 was in the setting of Dexilant.  Received labs from [facility] (10/01/2014):  - TSH 3.306 - fT4 1.75 (0.61-1.12) - this is a little high, which is expected if the lab draw was close to LT4 dosing  ROS: Constitutional: no weight gain, + fatigue, + hot flushes Eyes: no blurry vision, no xerophthalmia ENT: + sore throat, no nodules palpated in throat, occasional dysphagia/no odynophagia,  + hoarseness Cardiovascular: no CP/SOB/palpitations/no leg swelling Respiratory: + cough/no SOB Gastrointestinal: no N/V/D/C/heartburn Musculoskeletal: no muscle/+ joint aches Skin: no rashes Neurological: no tremors/numbness/tingling/dizziness, no HA  I reviewed pt's medications,  allergies, PMH, social hx, family hx, and changes were documented in the history of present illness. Otherwise, unchanged from my initial visit note.  PE: BP 104/62 mmHg  Pulse 83  Temp(Src) 97.4 F (36.3 C) (Oral)  Resp 12  Wt 221 lb (100.245 kg)  SpO2 96% Body mass index is 34.61 kg/(m^2).  Wt Readings from Last 3 Encounters:  06/11/15 221 lb (100.245 kg)  05/13/15 220 lb 4 oz (99.905 kg)  02/24/15 225 lb 12 oz (102.4 kg)  Constitutional: overweight, in NAD Eyes: PERRLA, EOMI, no exophthalmos ENT: dry mucous membranes, no thyromegaly, no cervical lymphadenopathy Cardiovascular: RRR, No MRG Respiratory: CTA B Gastrointestinal: abdomen soft, NT, ND, BS+ Musculoskeletal: no deformities, strength intact in all 4 Skin: moist, warm, no rashes  ASSESSMENT: 1. DM2, non-insulin-dependent, uncontrolled, without complications  2. Hashimoto's hypothyroidism  PLAN:  1. Patient with long-standing DM2, with worse control lately. She did not tolerate Cycloset (HA) and Glipizide XL 2.5 mg (hypoglycemia) or Metformin (AP). We have the option of adding Welchol, but she agrees to try Metformin ER (Glucophage DAW). -  I suggested to:  Patient Instructions  Please continue: - Invokana 300 mg daily in am - Trulicity 1.5 mg weekly  Try to add: Glucophage ER 500 mg with dinner and add 500 mg with lunch after 4-5 days.  Move Levothyroxine at night and Omeprazole at lunchtime or bedtime.  Take the thyroid hormone every day, with water, >30 minutes before breakfast, separated by >4 hours from acid reflux medications, calcium, iron, multivitamins.  Please return in 1.5 months with your sugar log.    - no SEs from Invokana - her HbA1c is 7.5% (unexplained: lower than before) - Return to clinic in 3 months with sugar log  2. Hashimoto's hypothyroidism - she moved Levothyroxine at night!!!  and PPI in am - reviewed recent TSH and fT4 from 04/2015 >> TSH high, at 14 (she does not remember if she  started LT4 at night before this) - Continue 125 g of LT4 daily >> move it to am and PPI at lunchtime or bedtime - We'll repeat thyroid tests at next visit

## 2015-06-11 NOTE — Patient Instructions (Addendum)
Please continue: - Invokana 300 mg daily in am - Trulicity 1.5 mg weekly  Try to add: Glucophage ER 500 mg with dinner and add 500 mg with lunch after 4-5 days.  Move Levothyroxine at night and Omeprazole at lunchtime or bedtime.  Take the thyroid hormone every day, with water, >30 minutes before breakfast, separated by >4 hours from acid reflux medications, calcium, iron, multivitamins.  Please return in 1.5 months with your sugar log.

## 2015-06-12 ENCOUNTER — Other Ambulatory Visit: Payer: Self-pay | Admitting: Internal Medicine

## 2015-06-12 ENCOUNTER — Encounter: Payer: Self-pay | Admitting: Internal Medicine

## 2015-06-12 MED ORDER — METFORMIN HCL ER 500 MG PO TB24: 500.0000 mg | ORAL_TABLET | Freq: Every day | ORAL | Status: DC

## 2015-06-12 NOTE — Telephone Encounter (Signed)
Pt would like to take the generic of metformin

## 2015-06-17 ENCOUNTER — Other Ambulatory Visit: Payer: Self-pay | Admitting: *Deleted

## 2015-06-17 MED ORDER — DULAGLUTIDE 1.5 MG/0.5ML ~~LOC~~ SOAJ: 1.5000 mg | SUBCUTANEOUS | Status: DC

## 2015-07-03 ENCOUNTER — Other Ambulatory Visit: Payer: Self-pay

## 2015-07-03 NOTE — Patient Outreach (Signed)
[facility]) Care Management  07/03/2015  Sarah Chaney 1960-12-18 [id number]  Phone call to schedule appointment. Last Link to Wellness appointment 11/05/14  No answer and message left.  Plan to send appointment request letter.  Peter Garter RN, [facility] Care Management Coordinator-Link to [place] Management [phone]

## 2015-07-06 ENCOUNTER — Other Ambulatory Visit: Payer: Self-pay | Admitting: Internal Medicine

## 2015-07-29 ENCOUNTER — Other Ambulatory Visit: Payer: Self-pay

## 2015-07-29 NOTE — Patient Outreach (Signed)
[facility]) Care Management  07/29/2015  Meshawn Oconnor [place] Jun 09, 1961 [id number]   Case closed as member has not responded to appointment request letter sent 07/03/15 or to phone message left.  Member has withdrawn from program as she has not met the required attendance policy for Link to Wellness. Peter Garter RN, [facility] Care Management Coordinator-Link to [place] Management [phone]

## 2015-08-04 ENCOUNTER — Other Ambulatory Visit: Payer: Self-pay

## 2015-08-04 MED ORDER — OMEPRAZOLE 40 MG PO CPDR: 40.0000 mg | DELAYED_RELEASE_CAPSULE | Freq: Two times a day (BID) | ORAL | Status: DC

## 2015-08-04 NOTE — Telephone Encounter (Signed)
Pt request refill omeprazole to [facility] [address]; last filled # 60 x 1 on 04/07/15 and last annual 05/13/15.

## 2015-08-18 ENCOUNTER — Ambulatory Visit: Payer: 59 | Admitting: Internal Medicine

## 2015-08-20 ENCOUNTER — Other Ambulatory Visit: Payer: Self-pay

## 2015-08-20 ENCOUNTER — Other Ambulatory Visit: Payer: Self-pay | Admitting: *Deleted

## 2015-08-20 MED ORDER — ZOLPIDEM TARTRATE 10 MG PO TABS: ORAL_TABLET | ORAL | Status: DC

## 2015-08-20 MED ORDER — CANAGLIFLOZIN 300 MG PO TABS: ORAL_TABLET | ORAL | Status: DC

## 2015-08-20 NOTE — Telephone Encounter (Signed)
Px written for call in   

## 2015-08-20 NOTE — Telephone Encounter (Signed)
Pt called and requested a refill of Invokana.

## 2015-08-20 NOTE — Telephone Encounter (Signed)
Pt left v/m requesting refill ambien to [facility] [address]. Pt has changed jobs and needs to change pharmacy to [facility] [address] from [employer]. Spoke with [facility] pharmacy and pt has no available refills there.last refilled # 30 x 3 on 05/04/15. Pt last annual 05/13/15. Please advise.

## 2015-08-21 NOTE — Telephone Encounter (Signed)
Rx called in to pharmacy. 

## 2015-10-09 ENCOUNTER — Other Ambulatory Visit: Payer: Self-pay | Admitting: *Deleted

## 2015-10-09 MED ORDER — LISINOPRIL 10 MG PO TABS: 10.0000 mg | ORAL_TABLET | Freq: Every day | ORAL | Status: DC

## 2015-10-15 ENCOUNTER — Other Ambulatory Visit: Payer: Self-pay | Admitting: *Deleted

## 2015-10-15 ENCOUNTER — Telehealth: Payer: Self-pay | Admitting: Cardiovascular Disease

## 2015-10-15 MED ORDER — LISINOPRIL 10 MG PO TABS: 10.0000 mg | ORAL_TABLET | Freq: Every day | ORAL | Status: DC

## 2015-10-15 NOTE — Telephone Encounter (Signed)
°*  STAT* If patient is at the pharmacy, call can be transferred to refill team.   1. Which medications need to be refilled? (please list name of each medication and dose if known) Lisinopril 10 mg   2. Which pharmacy/location (including street and city if local pharmacy) is medication to be sent to? [facility] on [address].  3. Do they need a 30 day or 90 day supply? 90 day (pt has upcoming apt) pt is completely out.

## 2015-11-26 ENCOUNTER — Ambulatory Visit: Payer: 59 | Admitting: Cardiovascular Disease

## 2015-12-03 ENCOUNTER — Ambulatory Visit: Payer: 59 | Admitting: Cardiovascular Disease

## 2015-12-07 ENCOUNTER — Other Ambulatory Visit: Payer: Self-pay | Admitting: *Deleted

## 2015-12-07 MED ORDER — ZOLPIDEM TARTRATE 10 MG PO TABS: ORAL_TABLET | ORAL | Status: DC

## 2015-12-07 NOTE — Telephone Encounter (Signed)
Px written for call in   

## 2015-12-07 NOTE — Telephone Encounter (Signed)
Faxed refill request. Last Filled:    30 tablet 3 08/20/2015  Last office visit:   05/13/15 CPE   Please advise.

## 2015-12-08 NOTE — Telephone Encounter (Signed)
Called in to [facility] [address].

## 2015-12-15 NOTE — Discharge Instructions (Signed)

## 2015-12-16 ENCOUNTER — Ambulatory Visit: Payer: Managed Care, Other (non HMO) | Admitting: Anesthesiology

## 2015-12-16 ENCOUNTER — Ambulatory Visit
Admission: RE | Admit: 2015-12-16 | Discharge: 2015-12-16 | Disposition: A | Discharge status: Home / Self Care | Payer: Managed Care, Other (non HMO) | Source: Ambulatory Visit | Attending: Gastroenterology | Admitting: Gastroenterology

## 2015-12-16 ENCOUNTER — Encounter
Admission: RE | Disposition: A | Discharge status: Home / Self Care | Payer: Self-pay | Source: Ambulatory Visit | Attending: Gastroenterology

## 2015-12-16 DIAGNOSIS — E119 Type 2 diabetes mellitus without complications: Secondary | ICD-10-CM | POA: Insufficient documentation

## 2015-12-16 DIAGNOSIS — R768 Other specified abnormal immunological findings in serum: Secondary | ICD-10-CM | POA: Diagnosis not present

## 2015-12-16 DIAGNOSIS — I10 Essential (primary) hypertension: Secondary | ICD-10-CM | POA: Diagnosis not present

## 2015-12-16 DIAGNOSIS — R1031 Right lower quadrant pain: Secondary | ICD-10-CM | POA: Diagnosis not present

## 2015-12-16 DIAGNOSIS — K219 Gastro-esophageal reflux disease without esophagitis: Secondary | ICD-10-CM | POA: Diagnosis not present

## 2015-12-16 DIAGNOSIS — R140 Abdominal distension (gaseous): Secondary | ICD-10-CM | POA: Insufficient documentation

## 2015-12-16 DIAGNOSIS — K3189 Other diseases of stomach and duodenum: Secondary | ICD-10-CM | POA: Diagnosis not present

## 2015-12-16 DIAGNOSIS — Z7984 Long term (current) use of oral hypoglycemic drugs: Secondary | ICD-10-CM | POA: Diagnosis not present

## 2015-12-16 DIAGNOSIS — Z7982 Long term (current) use of aspirin: Secondary | ICD-10-CM | POA: Insufficient documentation

## 2015-12-16 DIAGNOSIS — Z86010 Personal history of colonic polyps: Secondary | ICD-10-CM | POA: Diagnosis not present

## 2015-12-16 DIAGNOSIS — K317 Polyp of stomach and duodenum: Secondary | ICD-10-CM | POA: Diagnosis not present

## 2015-12-16 DIAGNOSIS — Z87891 Personal history of nicotine dependence: Secondary | ICD-10-CM | POA: Insufficient documentation

## 2015-12-16 DIAGNOSIS — R110 Nausea: Secondary | ICD-10-CM | POA: Insufficient documentation

## 2015-12-16 DIAGNOSIS — E7800 Pure hypercholesterolemia, unspecified: Secondary | ICD-10-CM | POA: Insufficient documentation

## 2015-12-16 DIAGNOSIS — Z888 Allergy status to other drugs, medicaments and biological substances status: Secondary | ICD-10-CM | POA: Insufficient documentation

## 2015-12-16 HISTORY — DX: Other complications of anesthesia, initial encounter: T88.59XA

## 2015-12-16 HISTORY — DX: Autoimmune thyroiditis: E06.3

## 2015-12-16 HISTORY — DX: Polyp of colon: K63.5

## 2015-12-16 HISTORY — DX: Adverse effect of unspecified anesthetic, initial encounter: T41.45XA

## 2015-12-16 HISTORY — DX: Other specified abnormal immunological findings in serum: R76.8

## 2015-12-16 HISTORY — DX: Nausea: R11.0

## 2015-12-16 HISTORY — DX: Nausea with vomiting, unspecified: Z98.890

## 2015-12-16 HISTORY — DX: Nausea with vomiting, unspecified: R11.2

## 2015-12-16 HISTORY — PX: ESOPHAGOGASTRODUODENOSCOPY: SHX5428

## 2015-12-16 HISTORY — DX: Other specified abnormal immunological findings in serum: R76.89

## 2015-12-16 HISTORY — DX: Celiac disease: K90.0

## 2015-12-16 HISTORY — DX: Unspecified convulsions: R56.9

## 2015-12-16 LAB — GLUCOSE, CAPILLARY
GLUCOSE-CAPILLARY: 154 mg/dL — AB (ref 65–99)
GLUCOSE-CAPILLARY: 146 mg/dL — AB (ref 65–99)

## 2015-12-16 SURGERY — EGD (ESOPHAGOGASTRODUODENOSCOPY)
Anesthesia: Monitor Anesthesia Care | Wound class: Clean Contaminated

## 2015-12-16 MED ORDER — SODIUM CHLORIDE 0.9 % IV SOLN: INTRAVENOUS | Status: DC

## 2015-12-16 MED ORDER — STERILE WATER FOR IRRIGATION IR SOLN
Status: DC | PRN
  Administered 2015-12-16: 100 mL

## 2015-12-16 MED ORDER — GLYCOPYRROLATE 0.2 MG/ML IJ SOLN
INTRAMUSCULAR | Status: DC | PRN
  Administered 2015-12-16: 0.1 mg via INTRAVENOUS

## 2015-12-16 MED ORDER — LIDOCAINE HCL (CARDIAC) 20 MG/ML IV SOLN
INTRAVENOUS | Status: DC | PRN
  Administered 2015-12-16: 40 mg via INTRAVENOUS

## 2015-12-16 MED ORDER — LACTATED RINGERS IV SOLN
INTRAVENOUS | Status: DC
  Administered 2015-12-16 (×2): via INTRAVENOUS

## 2015-12-16 MED ORDER — PROPOFOL 10 MG/ML IV BOLUS
INTRAVENOUS | Status: DC | PRN
  Administered 2015-12-16: 100 mg via INTRAVENOUS
  Administered 2015-12-16 (×2): 50 mg via INTRAVENOUS

## 2015-12-16 SURGICAL SUPPLY — 41 items
BALLN DILATOR 10-12 8 (BALLOONS)
BALLN DILATOR 12-15 8 (BALLOONS)
BALLN DILATOR 15-18 8 (BALLOONS)
BALLN DILATOR CRE 0-12 8 (BALLOONS)
BALLN DILATOR ESOPH 8 10 CRE (MISCELLANEOUS) IMPLANT
BALLOON DILATOR 12-15 8 (BALLOONS) IMPLANT
BALLOON DILATOR 15-18 8 (BALLOONS) IMPLANT
BALLOON DILATOR CRE 0-12 8 (BALLOONS) IMPLANT
BLOCK BITE 60FR ADLT L/F GRN (MISCELLANEOUS) ×2 IMPLANT
CANISTER SUCT 1200ML W/VALVE (MISCELLANEOUS) ×2 IMPLANT
FCP ESCP3.2XJMB 240X2.8X (MISCELLANEOUS) ×1
FORCEPS BIOP RAD 4 LRG CAP 4 (CUTTING FORCEPS) IMPLANT
FORCEPS BIOP RJ4 240 W/NDL (MISCELLANEOUS) ×1
FORCEPS ESCP3.2XJMB 240X2.8X (MISCELLANEOUS) ×1 IMPLANT
GOWN CVR UNV OPN BCK APRN NK (MISCELLANEOUS) ×1 IMPLANT
GOWN ISOL THUMB LOOP REG UNIV (MISCELLANEOUS) ×1
GOWN STRL REUS W/ TWL LRG LVL3 (GOWN DISPOSABLE) ×1 IMPLANT
GOWN STRL REUS W/TWL LRG LVL3 (GOWN DISPOSABLE) ×1
HEMOCLIP INSTINCT (CLIP) IMPLANT
INJECTOR VARIJECT VIN23 (MISCELLANEOUS) IMPLANT
KIT CO2 TUBING (TUBING) IMPLANT
KIT DEFENDO VALVE AND CONN (KITS) IMPLANT
KIT ENDO PROCEDURE OLY (KITS) ×2 IMPLANT
LIGATOR MULTIBAND 6SHOOTER MBL (MISCELLANEOUS) IMPLANT
MARKER SPOT ENDO TATTOO 5ML (MISCELLANEOUS) IMPLANT
PAD GROUND ADULT SPLIT (MISCELLANEOUS) IMPLANT
SNARE SHORT THROW 13M SML OVAL (MISCELLANEOUS) IMPLANT
SNARE SHORT THROW 30M LRG OVAL (MISCELLANEOUS) IMPLANT
SPOT EX ENDOSCOPIC TATTOO (MISCELLANEOUS)
SUCTION POLY TRAP 4CHAMBER (MISCELLANEOUS) IMPLANT
SYR INFLATION 60ML (SYRINGE) IMPLANT
TRAP SUCTION POLY (MISCELLANEOUS) IMPLANT
TUBING CONN 6MMX3.1M (TUBING)
TUBING SUCTION CONN 0.25 STRL (TUBING) IMPLANT
UNDERPAD 30X60 958B10 (PK) (MISCELLANEOUS) IMPLANT
VALVE BIOPSY ENDO (VALVE) IMPLANT
VARIJECT INJECTOR VIN23 (MISCELLANEOUS)
WATER AUXILLARY (MISCELLANEOUS) IMPLANT
WATER STERILE IRR 250ML POUR (IV SOLUTION) ×2 IMPLANT
WATER STERILE IRR 500ML POUR (IV SOLUTION) IMPLANT
WIRE CRE 18-20MM 8CM F G (MISCELLANEOUS) IMPLANT

## 2015-12-16 NOTE — Anesthesia Procedure Notes (Signed)
Procedure Name: MAC Performed by: Dionne Knoop Pre-anesthesia Checklist: Patient identified, Emergency Drugs available, Suction available, Patient being monitored and Timeout performed Patient Re-evaluated:Patient Re-evaluated prior to inductionOxygen Delivery Method: Nasal cannula       

## 2015-12-16 NOTE — Transfer of Care (Signed)
Immediate Anesthesia Transfer of Care Note  Patient: Sarah Chaney  Procedure(s) Performed: Procedure(s) with comments: ESOPHAGOGASTRODUODENOSCOPY (EGD) (N/A) - DIABETIC-ORAL MED  Patient Location: PACU  Anesthesia Type: MAC  Level of Consciousness: awake, alert  and patient cooperative  Airway and Oxygen Therapy: Patient Spontanous Breathing and Patient connected to supplemental oxygen  Post-op Assessment: Post-op Vital signs reviewed, Patient's Cardiovascular Status Stable, Respiratory Function Stable, Patent Airway and No signs of Nausea or vomiting  Post-op Vital Signs: Reviewed and stable  Complications: No apparent anesthesia complications

## 2015-12-16 NOTE — H&P (Signed)
  Date of Initial H&P: 12/03/2015  History reviewed, patient examined, no change in status, stable for surgery.

## 2015-12-16 NOTE — Anesthesia Preprocedure Evaluation (Signed)
Anesthesia Evaluation    History of Anesthesia Complications (+) PONV and history of anesthetic complications  Airway Mallampati: II  TM Distance: >3 FB Neck ROM: Full    Dental no notable dental hx.    Pulmonary former smoker,    Pulmonary exam normal breath sounds clear to auscultation       Cardiovascular hypertension, Normal cardiovascular exam Rhythm:Regular Rate:Normal  hypercholesterolemia   Neuro/Psych    GI/Hepatic GERD  Medicated,Celiac disease   Endo/Other  diabetes, Type 2  Renal/GU      Musculoskeletal   Abdominal   Peds  Hematology   Anesthesia Other Findings   Reproductive/Obstetrics                             Anesthesia Physical Anesthesia Plan  ASA: II  Anesthesia Plan: MAC   Post-op Pain Management:    Induction: Intravenous  Airway Management Planned:   Additional Equipment:   Intra-op Plan:   Post-operative Plan: Extubation in OR  Informed Consent: I have reviewed the patients History and Physical, chart, labs and discussed the procedure including the risks, benefits and alternatives for the proposed anesthesia with the patient or authorized representative who has indicated his/her understanding and acceptance.   Dental advisory given  Plan Discussed with: CRNA  Anesthesia Plan Comments:         Anesthesia Quick Evaluation

## 2015-12-16 NOTE — Op Note (Signed)
[facility] Gastroenterology Patient Name: Sarah Chaney Procedure Date: 12/16/2015 8:01 AM MRN: [id number] Account #: [id number] Date of Birth: 11/30/1960 Admit Type: Outpatient Age: 55 Room: [facility] OR ROOM 01 Gender: Female Note Status: Finalized Procedure:            Upper GI endoscopy Indications:          Positive celiac serologies Providers:            Lupita Dawn. Candace Cruise, MD Referring MD:         Wynelle Fanny Tower, MD (Referring MD) Medicines:            Monitored Anesthesia Care Complications:        No immediate complications. Procedure:            Pre-Anesthesia Assessment:                       - Prior to the procedure, a History and Physical was                        performed, and patient medications, allergies and                        sensitivities were reviewed. The patient's tolerance of                        previous anesthesia was reviewed.                       - The risks and benefits of the procedure and the                        sedation options and risks were discussed with the                        patient. All questions were answered and informed                        consent was obtained.                       - After reviewing the risks and benefits, the patient                        was deemed in satisfactory condition to undergo the                        procedure.                       After obtaining informed consent, the endoscope was                        passed under direct vision. Throughout the procedure,                        the patient's blood pressure, pulse, and oxygen                        saturations were monitored continuously. The Olympus  [id number] Endoscope (S#. [id number]) was introduced                        through the mouth, and advanced to the second part of                        duodenum. The upper GI endoscopy was accomplished                        without difficulty. The patient tolerated the  procedure                        well. Findings:      The examined esophagus was normal.      Scalloped mucosa was found in the entire duodenum. Biopsies for       histology were taken with a cold forceps for evaluation of celiac       disease.      A medium amount of food (residue) was found in the gastric antrum.      Multiple small sessile polyps were found in the gastric fundus. Likely       to be fundic polyps      The exam of the stomach was otherwise normal. Impression:           - Normal esophagus.                       - Normal stomach.                       - Duodenal mucosal changes seen, suspicious for celiac                        disease. Biopsied. Recommendation:       - Discharge patient to home.                       - Observe patient's clinical course.                       - Await pathology results.                       - Continue present medications.                       - The findings and recommendations were discussed with                        the patient's family.                       - Gluten free diet if bx confirm celiac disease. Procedure Code(s):    --- Professional ---                       [id number], Esophagogastroduodenoscopy, flexible, transoral;                        with biopsy, single or multiple Diagnosis Code(s):    --- Professional ---                       K31.89, Other diseases of stomach and  duodenum                       R76.8, Other specified abnormal immunological findings                        in serum CPT copyright 2016 American Medical Association. All rights reserved. The codes documented in this report are preliminary and upon coder review may  be revised to meet current compliance requirements. Hulen Luster, MD 12/16/2015 8:13:13 AM This report has been signed electronically. Number of Addenda: 0 Note Initiated On: 12/16/2015 8:01 AM      [facility]

## 2015-12-16 NOTE — Anesthesia Postprocedure Evaluation (Signed)
Anesthesia Post Note  Patient: Sarah Chaney  Procedure(s) Performed: Procedure(s) (LRB): ESOPHAGOGASTRODUODENOSCOPY (EGD) (N/A)  Patient location during evaluation: PACU Anesthesia Type: MAC Level of consciousness: awake and alert Pain management: pain level controlled Vital Signs Assessment: post-procedure vital signs reviewed and stable Respiratory status: spontaneous breathing, nonlabored ventilation, respiratory function stable and patient connected to nasal cannula oxygen Cardiovascular status: stable and blood pressure returned to baseline Anesthetic complications: no    Rayah Fines C

## 2015-12-17 ENCOUNTER — Encounter: Payer: Self-pay | Admitting: Gastroenterology

## 2015-12-18 LAB — SURGICAL PATHOLOGY

## 2016-04-01 ENCOUNTER — Other Ambulatory Visit: Payer: Self-pay

## 2016-04-01 ENCOUNTER — Other Ambulatory Visit: Payer: Self-pay | Admitting: Family Medicine

## 2016-04-01 MED ORDER — ZOLPIDEM TARTRATE 10 MG PO TABS: ORAL_TABLET | ORAL | 3 refills | Status: DC

## 2016-04-01 NOTE — Telephone Encounter (Signed)
Rx called in as prescribed 

## 2016-04-01 NOTE — Telephone Encounter (Signed)
Px written for call in   

## 2016-04-01 NOTE — Telephone Encounter (Signed)
Pt left v/m requesting refill ambien. Pt does not have med for weekend; request to be refilled 04/01/16. Last refilled # 30 x 3 on 12/07/15.last annual 05/13/15. Pt request cb when refill completed. [facility] [address].

## 2016-04-29 ENCOUNTER — Other Ambulatory Visit: Payer: Self-pay | Admitting: Family Medicine

## 2016-04-29 DIAGNOSIS — Z1231 Encounter for screening mammogram for malignant neoplasm of breast: Secondary | ICD-10-CM

## 2016-06-03 ENCOUNTER — Ambulatory Visit
Admission: RE | Admit: 2016-06-03 | Discharge: 2016-06-03 | Disposition: A | Discharge status: Home / Self Care | Payer: Managed Care, Other (non HMO) | Source: Ambulatory Visit | Attending: Family Medicine | Admitting: Family Medicine

## 2016-06-03 DIAGNOSIS — Z1231 Encounter for screening mammogram for malignant neoplasm of breast: Secondary | ICD-10-CM | POA: Insufficient documentation

## 2016-07-06 ENCOUNTER — Other Ambulatory Visit: Payer: Self-pay | Admitting: Family Medicine

## 2016-07-08 NOTE — Telephone Encounter (Signed)
Pt hasn't been seen in over a year and no future appts., please advise  

## 2016-07-08 NOTE — Telephone Encounter (Signed)
Please schedule spring PE and refill until then thanks

## 2016-07-27 ENCOUNTER — Other Ambulatory Visit: Payer: Self-pay | Admitting: Family Medicine

## 2016-07-27 NOTE — Telephone Encounter (Signed)
Pt hasn't been seen in over a year and no future appts., last filled on 04/01/16 #30 tabs with 3 additional refill, please advise

## 2016-07-27 NOTE — Telephone Encounter (Signed)
Pt request cb with status of ambien refill to [facility] [address].Please advise.

## 2016-07-27 NOTE — Telephone Encounter (Signed)
Rx called in as prescribed 

## 2016-07-27 NOTE — Telephone Encounter (Signed)
Px written for call in   

## 2016-12-27 ENCOUNTER — Emergency Department
Admission: EM | Admit: 2016-12-27 | Discharge: 2016-12-27 | Disposition: A | Discharge status: Home / Self Care | Payer: 59 | Attending: Emergency Medicine | Admitting: Emergency Medicine

## 2016-12-27 ENCOUNTER — Encounter: Payer: Self-pay | Admitting: Emergency Medicine

## 2016-12-27 ENCOUNTER — Emergency Department: Payer: 59

## 2016-12-27 DIAGNOSIS — R1032 Left lower quadrant pain: Secondary | ICD-10-CM | POA: Insufficient documentation

## 2016-12-27 DIAGNOSIS — E119 Type 2 diabetes mellitus without complications: Secondary | ICD-10-CM | POA: Diagnosis not present

## 2016-12-27 DIAGNOSIS — I10 Essential (primary) hypertension: Secondary | ICD-10-CM | POA: Diagnosis not present

## 2016-12-27 DIAGNOSIS — Z7984 Long term (current) use of oral hypoglycemic drugs: Secondary | ICD-10-CM | POA: Diagnosis not present

## 2016-12-27 DIAGNOSIS — M545 Low back pain, unspecified: Secondary | ICD-10-CM

## 2016-12-27 DIAGNOSIS — Z79899 Other long term (current) drug therapy: Secondary | ICD-10-CM | POA: Insufficient documentation

## 2016-12-27 LAB — URINALYSIS, COMPLETE (UACMP) WITH MICROSCOPIC
Bacteria, UA: NONE SEEN
Bilirubin Urine: NEGATIVE
Glucose, UA: >=500 mg/dL — AB
Hgb urine dipstick: NEGATIVE
Ketones, ur: 5 mg/dL — AB
Leukocytes, UA: NEGATIVE
Nitrite: NEGATIVE
Protein, ur: NEGATIVE mg/dL
SPECIFIC GRAVITY, URINE: 1.021 (ref 1.005–1.030)
pH: 5 (ref 5.0–8.0)

## 2016-12-27 LAB — COMPREHENSIVE METABOLIC PANEL
ALBUMIN: 4.1 g/dL (ref 3.5–5.0)
ALK PHOS: 96 U/L (ref 38–126)
ALT: 36 U/L (ref 14–54)
AST: 43 U/L — ABNORMAL HIGH (ref 15–41)
Anion gap: 9 (ref 5–15)
BUN: 18 mg/dL (ref 6–20)
CALCIUM: 9.3 mg/dL (ref 8.9–10.3)
CO2: 22 mmol/L (ref 22–32)
CREATININE: 0.75 mg/dL (ref 0.44–1.00)
Chloride: 104 mmol/L (ref 101–111)
GFR calc non Af Amer: >60 mL/min (ref >60)
GLUCOSE: 190 mg/dL — AB (ref 65–99)
Potassium: 4.9 mmol/L (ref 3.5–5.1)
SODIUM: 135 mmol/L (ref 135–145)
Total Bilirubin: 0.7 mg/dL (ref 0.3–1.2)
Total Protein: 7.1 g/dL (ref 6.5–8.1)

## 2016-12-27 LAB — CBC
HCT: 44.2 % (ref 35.0–47.0)
HEMOGLOBIN: 15.2 g/dL (ref 12.0–16.0)
MCH: 28.4 pg (ref 26.0–34.0)
MCHC: 34.4 g/dL (ref 32.0–36.0)
MCV: 82.4 fL (ref 80.0–100.0)
Platelets: 176 10*3/uL (ref 150–440)
RBC: 5.36 MIL/uL — AB (ref 3.80–5.20)
RDW: 13.8 % (ref 11.5–14.5)
WBC: 9.9 10*3/uL (ref 3.6–11.0)

## 2016-12-27 LAB — LIPASE, BLOOD: LIPASE: 31 U/L (ref 11–51)

## 2016-12-27 MED ORDER — IOPAMIDOL (ISOVUE-300) INJECTION 61%
30.0000 mL | Freq: Once | INTRAVENOUS | Status: AC | PRN
  Administered 2016-12-27: 30 mL via ORAL

## 2016-12-27 MED ORDER — HYDROCODONE-ACETAMINOPHEN 5-325 MG PO TABS
1.0000 | ORAL_TABLET | ORAL | 0 refills | Status: DC | PRN
Start: 2016-12-27 — End: 2017-07-05

## 2016-12-27 MED ORDER — MORPHINE SULFATE (PF) 4 MG/ML IV SOLN
4.0000 mg | Freq: Once | INTRAVENOUS | Status: AC
  Administered 2016-12-27: 4 mg via INTRAVENOUS
  Filled 2016-12-27: qty 1

## 2016-12-27 MED ORDER — NAPROXEN 500 MG PO TABS: 500.0000 mg | ORAL_TABLET | Freq: Two times a day (BID) | ORAL | 2 refills | Status: DC

## 2016-12-27 MED ORDER — DEXAMETHASONE SODIUM PHOSPHATE 10 MG/ML IJ SOLN
10.0000 mg | Freq: Once | INTRAMUSCULAR | Status: AC
  Administered 2016-12-27: 10 mg via INTRAVENOUS
  Filled 2016-12-27: qty 1

## 2016-12-27 MED ORDER — ONDANSETRON HCL 4 MG/2ML IJ SOLN
4.0000 mg | Freq: Once | INTRAMUSCULAR | Status: AC
  Administered 2016-12-27: 4 mg via INTRAVENOUS
  Filled 2016-12-27: qty 2

## 2016-12-27 MED ORDER — FENTANYL CITRATE (PF) 100 MCG/2ML IJ SOLN
INTRAMUSCULAR | Status: AC
  Filled 2016-12-27: qty 2

## 2016-12-27 MED ORDER — IOPAMIDOL (ISOVUE-300) INJECTION 61%
100.0000 mL | Freq: Once | INTRAVENOUS | Status: AC | PRN
Start: 2016-12-27 — End: 2016-12-27
  Administered 2016-12-27: 100 mL via INTRAVENOUS

## 2016-12-27 MED ORDER — FENTANYL CITRATE (PF) 100 MCG/2ML IJ SOLN
50.0000 ug | Freq: Once | INTRAMUSCULAR | Status: AC
  Administered 2016-12-27: 50 ug via INTRAVENOUS

## 2016-12-27 NOTE — ED Provider Notes (Signed)
[facility] Emergency Department Provider Note   ____________________________________________    I have reviewed the triage vital signs and the nursing notes.   HISTORY  Chief Complaint Abdominal Pain     HPI Sarah Chaney is a 56 y.o. female Who presents with complaints of abdominal pain. Patient reports abdominal pain started 3 days ago, was much better yesterday but then restarted again today. She describes it as a pain in her primarily left lower quadrant of her abdomen but also in her back. She reports the pain is moderate to severe, and cramping in nature. She reports nausea and vomiting. Normal stools. Saw GI yesterday. She has never had anything like this before, does not feel like kidney stones.   Past Medical History:  Diagnosis Date  . Adenomatous colon polyp 1997  . Anemia, unspecified   . Celiac disease   . Chondromalacia of patella   . Colon polyps    HX OF  . Complication of anesthesia   . Diabetes mellitus ([place])    type 2  . Disorders of bursae and tendons in shoulder region, unspecified   . Diverticulosis   . Elevated anti-tissue transglutaminase (tTG) IgA level   . Gastric polyps   . GERD (gastroesophageal reflux disease)   . Hashimoto's disease   . Headache(784.0)   . Irritable bowel syndrome    postprandial abdominal pain and cramping  . Lateral epicondylitis  of elbow    FOOT  . Nausea   . Neuralgia, neuritis, and radiculitis, unspecified   . Obesity, unspecified   . Palpitations   . Persistent disorder of initiating or maintaining sleep   . Personal history of tobacco use, presenting hazards to health   . PONV (postoperative nausea and vomiting)   . Pure hypercholesterolemia   . Restless legs syndrome (RLS)   . Seizures ([place])    HX OF AT AGE 33, EPILEPSY, 4 GRAND MALL SEISURES. WEANED OFF AT AGE 20  . Tachycardia, unspecified   . 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 Unspecified essential hypertension    CONTROLLED ON  MEDS  . Unspecified hypothyroidism   . Unspecified polyarthropathy or polyarthritis, multiple sites   . Variants of migraine, not elsewhere classified, without mention of intractable migraine without mention of status migrainosus     Patient Active Problem List   Diagnosis Date Noted  . Encounter for routine gynecological examination 05/13/2015  . Chest wall pain 02/24/2015  . Palpitations 08/20/2014  . Dizzinesses 04/25/2014  . Headache(784.0) 02/20/2014  . Lightheaded 02/13/2014  . Pain in joint, ankle and foot 12/18/2013  . Left eye pain 05/29/2013  . Abnormal transaminases 10/08/2012  . Hashimoto's thyroiditis 10/03/2012  . History of pyelonephritis 06/24/2012  . Routine general medical examination at a health care facility 06/26/2011  . Other screening mammogram 04/22/2011  . RESTLESS LEG SYNDROME 08/19/2010  . Migraine with aura 08/19/2010  . 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 UNSPECIFIED NEURALGIA NEURITIS AND RADICULITIS 06/30/2009  . SEIZURES, HX OF 05/20/2009  . ROTATOR CUFF SYNDROME, LEFT 05/05/2009  . POLYARTHRITIS 07/03/2008  . PERIMENOPAUSAL STATUS 07/03/2008  . PURE HYPERCHOLESTEROLEMIA 05/25/2007  . INSOMNIA, CHRONIC 03/23/2007  . PAIN IN JOINT, MULTIPLE SITES 03/23/2007  . OBESITY 03/20/2007  . ANEMIA-NOS 03/20/2007  . Essential hypertension 03/20/2007  . GERD 03/20/2007  . IBS 03/20/2007  . FATTY LIVER DISEASE 03/20/2007  . POSITIVE PPD 03/20/2007  . COLONIC POLYPS, HX OF 03/20/2007  . TOBACCO USE, QUIT 03/20/2007    Past  Surgical History:  Procedure Laterality Date  . ABD [place]  12/2002   Fatty liver  . ABD [place]  05/2010   Fatty liver, normal gall bladder and pancreas  . ABDOMINOPLASTY    . BREAST BIOPSY Right 1992   excisional negative biopsy  . CESAREAN SECTION     x2  . COLONOSCOPY  12/2001   Negative  . ESOPHAGOGASTRODUODENOSCOPY  12/2001  . ESOPHAGOGASTRODUODENOSCOPY  11/2007   nml  . ESOPHAGOGASTRODUODENOSCOPY N/A 12/16/2015    Procedure: ESOPHAGOGASTRODUODENOSCOPY (EGD);  Surgeon: Hulen Luster, MD;  Location: [facility];  Service: Gastroenterology;  Laterality: N/A;  DIABETIC-ORAL MED  . exercise treadmill  5/13   normal  . POLYPECTOMY  12/2001  . POPLITEAL SYNOVIAL CYST EXCISION  12/2003  . REDUCTION MAMMAPLASTY Bilateral 1991  . TONSILLECTOMY    . TOOTH EXTRACTION  1978   root canal x3    Prior to Admission medications   Medication Sig Start Date End Date Taking? Authorizing Provider  aspirin 81 MG tablet Take 162 mg by mouth daily. AM    [provider]  BAYER MICROLET LANCETS lancets Use to test blood sugar 4 times daily as instructed. 09/16/14   Philemon Kingdom, MD  Biotin 10 MG TABS Take 1 tablet by mouth daily. AM    [provider]  Blood Glucose Monitoring Suppl (BAYER CONTOUR NEXT MONITOR) W/DEVICE KIT Use to test blood sugar 4 times daily. 09/16/14   Philemon Kingdom, MD  canagliflozin (INVOKANA) 300 MG TABS tablet TAKE 1 TABLET BY MOUTH ONCE DAILY IN THE MORNING 08/20/15   Philemon Kingdom, MD  dicyclomine (BENTYL) 10 MG capsule Take 10 mg by mouth 4 (four) times daily -  before meals and at bedtime. Reported on 12/16/2015    [provider]  Dulaglutide (TRULICITY) 1.5 TI/4.5YK SOPN Inject 1.5 mg into the skin once a week. Patient taking differently: Inject 1.5 mg into the skin once a week. SUNDAY 06/17/15   Philemon Kingdom, MD  glucose blood (BAYER CONTOUR NEXT TEST) test strip Use to test blood sugar 4 times daily as instructed. 09/16/14   Philemon Kingdom, MD  HYDROcodone-acetaminophen (NORCO/VICODIN) 5-325 MG tablet Take 1 tablet by mouth every 4 (four) hours as needed for moderate pain. 12/27/16   Lavonia Drafts, MD  levothyroxine (SYNTHROID, LEVOTHROID) 125 MCG tablet TAKE 1 TABLET BY MOUTH DAILY BEFORE BREAKFAST. 07/06/15   Philemon Kingdom, MD  lisinopril (PRINIVIL,ZESTRIL) 10 MG tablet Take 1 tablet (10 mg total) by mouth daily. Patient taking differently:  Take 10 mg by mouth daily. AM 10/15/15   Minna Merritts, MD  metFORMIN (GLUCOPHAGE XR) 500 MG 24 hr tablet Take 1 tablet (500 mg total) by mouth daily with breakfast. Patient taking differently: Take 1,000 mg by mouth daily with breakfast. AM 06/12/15   Philemon Kingdom, MD  metoprolol (LOPRESSOR) 50 MG tablet TAKE 1 TABLET BY MOUTH TWICE DAILY Patient taking differently: TAKE 1 TABLET ONCE A DAY, TWO IF NEEDED 06/01/15   Minna Merritts, MD  naproxen (NAPROSYN) 500 MG tablet Take 1 tablet (500 mg total) by mouth 2 (two) times daily with a meal. 12/27/16   Lavonia Drafts, MD  omeprazole (PRILOSEC) 40 MG capsule Take 1 capsule (40 mg total) by mouth 2 (two) times daily. **needs to schedule appt with doctor before future refills are given 07/12/16   Tower, Wynelle Fanny, MD  SUMAtriptan (IMITREX) 50 MG tablet Take 1 tablet (50 mg total) by mouth once. May repeat in 2 hours if headache  persists or recurs. Patient not taking: Reported on 12/10/2015 02/19/14   Tower, Wynelle Fanny, MD  zolpidem (AMBIEN) 10 MG tablet TAKE ONE TABLET BY MOUTH AT BEDTIME AS NEEDED FOR SLEEP 07/27/16   Tower, Wynelle Fanny, MD     Allergies Carbamazepine; Cycloset [bromocriptine]; Diltiazem hcl; Metformin and related; Nifedipine; and Verapamil  Family History  Problem Relation Age of Onset  . Diabetes type II Father   . Diabetes Mother   . Hyperlipidemia Mother   . Thyroid disease Mother   . Diabetes type II Mother   . Diabetes Unknown        neice  . Breast cancer Maternal Aunt 65    Social History Social History  Substance Use Topics  . Smoking status: Former Smoker    Packs/day: 0.30    Years: 32.00    Types: Cigarettes    Quit date: 11/08/2011  . Smokeless tobacco: Never Used  . Alcohol use No     Comment: rare    Review of Systems  Constitutional: No fever/chills Eyes: No visual changes.  ENT: No sore throat. Cardiovascular: Denies chest pain. Respiratory: Denies shortness of breath. Gastrointestinal: as  above Genitourinary: Negative for dysuria.no frequency Musculoskeletal: back pain as above Skin: Negative for rash. Neurological: Negative for headaches or weakness   ____________________________________________   PHYSICAL EXAM:  VITAL SIGNS: ED Triage Vitals [12/27/16 0722]  Enc Vitals Group     BP 128/82     Pulse Rate 97     Resp 20     Temp      Temp Source Oral     SpO2 95 %     Weight 100.2 kg (221 lb)     Height      Head Circumference      Peak Flow      Pain Score 10     Pain Loc      Pain Edu?      Excl. in [place]?     Constitutional: Alert and oriented. No acute distress. Pleasant and interactive Eyes: Conjunctivae are normal.   Nose: No congestion/rhinnorhea. Mouth/Throat: Mucous membranes are moist.    Cardiovascular: Normal rate, regular rhythm. Grossly normal heart sounds.  Good peripheral circulation. Respiratory: Normal respiratory effort.  No retractions. Lungs CTAB. Gastrointestinal: tenderness palpation left lower quadrant. No distention.  No CVA tenderness. Genitourinary: deferred Musculoskeletal: No lower extremity tenderness nor edema.  Warm and well perfused Neurologic:  Normal speech and language. No gross focal neurologic deficits are appreciated.  Skin:  Skin is warm, dry and intact. No rash noted. Psychiatric: Mood and affect are normal. Speech and behavior are normal.  ____________________________________________   LABS (all labs ordered are listed, but only abnormal results are displayed)  Labs Reviewed  CBC - Abnormal; Notable for the following:       Result Value   RBC 5.36 (*)    All other components within normal limits  COMPREHENSIVE METABOLIC PANEL - Abnormal; Notable for the following:    Glucose, Bld 190 (*)    AST 43 (*)    All other components within normal limits  URINALYSIS, COMPLETE (UACMP) WITH MICROSCOPIC - Abnormal; Notable for the following:    Color, Urine STRAW (*)    APPearance CLEAR (*)    Glucose, UA >=500  (*)    Ketones, ur 5 (*)    Squamous Epithelial / LPF 0-5 (*)    All other components within normal limits  LIPASE, BLOOD   ____________________________________________  EKG  None  ____________________________________________  RADIOLOGY  CT abdomen pelvisNo acute distress ____________________________________________   PROCEDURES  Procedure(s) performed: No    Critical Care performed: No ____________________________________________   INITIAL IMPRESSION / ASSESSMENT AND PLAN / ED COURSE  Pertinent labs & imaging results that were available during my care of the patient were reviewed by me and considered in my medical decision making (see chart for details).  Patient presents with primarily left lower quadrant abdominal pain. Differential includes diverticulitis versus ureterolithiasis versus UTI versus back pain with radiculopathy. We will give IV morphine, IV Zofran obtain CT abdomen pelvis to evaluate further  CT abdomen and pelvis essentially unremarkable, no diverticulitis, no ureterolithiasis. Urinalysis unremarkable, labs were reassuring. Patient had significant improvement after IV fentanyl. She has no abdominal pain currently seems to be primarily back pain which is worse with any sort of movement. I suspect back pain with radiculopathy as cause of her pain. We will give her shot of Decadron, discussed that this can affect her glucose she needs to monitor carefully. I will refer her to neurosurgery for further evaluation. Patient given narcotics but warned of the side effects including constipation dizziness weakness nausea    ____________________________________________   FINAL CLINICAL IMPRESSION(S) / ED DIAGNOSES  Final diagnoses:  Acute left-sided low back pain without sciatica  Left lower quadrant pain      NEW MEDICATIONS STARTED DURING THIS VISIT:  New Prescriptions   HYDROCODONE-ACETAMINOPHEN (NORCO/VICODIN) 5-325 MG TABLET    Take 1 tablet by mouth  every 4 (four) hours as needed for moderate pain.   NAPROXEN (NAPROSYN) 500 MG TABLET    Take 1 tablet (500 mg total) by mouth 2 (two) times daily with a meal.     Note:  This document was prepared using Dragon voice recognition software and may include unintentional dictation errors.    Lavonia Drafts, MD 12/27/16 1044

## 2016-12-27 NOTE — ED Notes (Signed)
Notified CT that pt was finished with contrast and ready for scan.

## 2016-12-27 NOTE — ED Notes (Signed)
Sarah Chaney aware of patient placement in Rm 9

## 2016-12-27 NOTE — ED Triage Notes (Signed)
Pt with abd pain and nausea, was seen by GI yesterday and given reglan and has not taken the medicine yet.

## 2017-04-07 ENCOUNTER — Other Ambulatory Visit: Payer: Self-pay | Admitting: Student

## 2017-04-07 DIAGNOSIS — M25552 Pain in left hip: Secondary | ICD-10-CM

## 2017-04-18 ENCOUNTER — Ambulatory Visit
Admission: RE | Admit: 2017-04-18 | Discharge: 2017-04-18 | Disposition: A | Discharge status: Home / Self Care | Payer: 59 | Source: Ambulatory Visit | Attending: Student | Admitting: Student

## 2017-04-18 DIAGNOSIS — S76022A Laceration of muscle, fascia and tendon of left hip, initial encounter: Secondary | ICD-10-CM | POA: Insufficient documentation

## 2017-04-18 DIAGNOSIS — M25552 Pain in left hip: Secondary | ICD-10-CM | POA: Insufficient documentation

## 2017-04-18 DIAGNOSIS — X58XXXA Exposure to other specified factors, initial encounter: Secondary | ICD-10-CM | POA: Diagnosis not present

## 2017-04-18 MED ORDER — GADOBENATE DIMEGLUMINE 529 MG/ML IV SOLN
0.1000 mL | Freq: Once | INTRAVENOUS | Status: AC | PRN
  Administered 2017-04-18: 0.1 mL via INTRAVENOUS

## 2017-04-18 MED ORDER — IOPAMIDOL (ISOVUE-200) INJECTION 41%
20.0000 mL | Freq: Once | INTRAVENOUS | Status: AC | PRN
  Administered 2017-04-18: 20 mL via INTRAVENOUS
  Filled 2017-04-18: qty 50

## 2017-04-18 MED ORDER — LIDOCAINE HCL (PF) 1 % IJ SOLN
15.0000 mL | Freq: Once | INTRAMUSCULAR | Status: AC
  Administered 2017-04-18: 15 mL
  Filled 2017-04-18: qty 15

## 2017-07-05 ENCOUNTER — Other Ambulatory Visit: Payer: Self-pay

## 2017-07-05 ENCOUNTER — Encounter: Payer: Self-pay | Admitting: *Deleted

## 2017-07-11 ENCOUNTER — Ambulatory Visit: Payer: 59 | Admitting: Anesthesiology

## 2017-07-11 ENCOUNTER — Ambulatory Visit
Admission: RE | Admit: 2017-07-11 | Discharge: 2017-07-11 | Disposition: A | Discharge status: Home / Self Care | Payer: 59 | Source: Ambulatory Visit | Attending: Orthopedic Surgery | Admitting: Orthopedic Surgery

## 2017-07-11 ENCOUNTER — Encounter
Admission: RE | Disposition: A | Discharge status: Home / Self Care | Payer: Self-pay | Source: Ambulatory Visit | Attending: Orthopedic Surgery

## 2017-07-11 DIAGNOSIS — Z79899 Other long term (current) drug therapy: Secondary | ICD-10-CM | POA: Insufficient documentation

## 2017-07-11 DIAGNOSIS — Z794 Long term (current) use of insulin: Secondary | ICD-10-CM | POA: Diagnosis not present

## 2017-07-11 DIAGNOSIS — E039 Hypothyroidism, unspecified: Secondary | ICD-10-CM | POA: Diagnosis not present

## 2017-07-11 DIAGNOSIS — M6289 Other specified disorders of muscle: Secondary | ICD-10-CM | POA: Insufficient documentation

## 2017-07-11 DIAGNOSIS — M7062 Trochanteric bursitis, left hip: Secondary | ICD-10-CM | POA: Insufficient documentation

## 2017-07-11 DIAGNOSIS — M25552 Pain in left hip: Secondary | ICD-10-CM | POA: Diagnosis present

## 2017-07-11 DIAGNOSIS — I129 Hypertensive chronic kidney disease with stage 1 through stage 4 chronic kidney disease, or unspecified chronic kidney disease: Secondary | ICD-10-CM | POA: Diagnosis not present

## 2017-07-11 DIAGNOSIS — N189 Chronic kidney disease, unspecified: Secondary | ICD-10-CM | POA: Diagnosis not present

## 2017-07-11 DIAGNOSIS — E1122 Type 2 diabetes mellitus with diabetic chronic kidney disease: Secondary | ICD-10-CM | POA: Diagnosis not present

## 2017-07-11 DIAGNOSIS — Z6834 Body mass index (BMI) 34.0-34.9, adult: Secondary | ICD-10-CM | POA: Insufficient documentation

## 2017-07-11 DIAGNOSIS — E785 Hyperlipidemia, unspecified: Secondary | ICD-10-CM | POA: Insufficient documentation

## 2017-07-11 DIAGNOSIS — Z87891 Personal history of nicotine dependence: Secondary | ICD-10-CM | POA: Insufficient documentation

## 2017-07-11 DIAGNOSIS — Z7982 Long term (current) use of aspirin: Secondary | ICD-10-CM | POA: Diagnosis not present

## 2017-07-11 DIAGNOSIS — K219 Gastro-esophageal reflux disease without esophagitis: Secondary | ICD-10-CM | POA: Diagnosis not present

## 2017-07-11 HISTORY — DX: Presence of spectacles and contact lenses: Z97.3

## 2017-07-11 HISTORY — PX: KNEE BURSECTOMY: SHX5882

## 2017-07-11 LAB — GLUCOSE, CAPILLARY
GLUCOSE-CAPILLARY: 154 mg/dL — AB (ref 65–99)
Glucose-Capillary: 161 mg/dL — ABNORMAL HIGH (ref 65–99)

## 2017-07-11 SURGERY — BURSECTOMY, KNEE
Anesthesia: General | Laterality: Left | Wound class: Clean

## 2017-07-11 MED ORDER — DEXTROSE 5 % IV SOLN
2000.0000 mg | Freq: Once | INTRAVENOUS | Status: AC
  Administered 2017-07-11: 2000 mg via INTRAVENOUS

## 2017-07-11 MED ORDER — HYDROMORPHONE HCL 1 MG/ML IJ SOLN
0.5000 mg | INTRAMUSCULAR | Status: DC | PRN
  Administered 2017-07-11 (×2): 0.5 mg via INTRAVENOUS

## 2017-07-11 MED ORDER — LIDOCAINE HCL 1 % IJ SOLN
INTRAMUSCULAR | Status: DC | PRN
  Administered 2017-07-11: 10 mL

## 2017-07-11 MED ORDER — ONDANSETRON HCL 4 MG/2ML IJ SOLN
INTRAMUSCULAR | Status: DC | PRN
  Administered 2017-07-11: 4 mg via INTRAVENOUS

## 2017-07-11 MED ORDER — SUCCINYLCHOLINE CHLORIDE 20 MG/ML IJ SOLN
INTRAMUSCULAR | Status: DC | PRN
  Administered 2017-07-11: 100 mg via INTRAVENOUS

## 2017-07-11 MED ORDER — LACTATED RINGERS IV SOLN
INTRAVENOUS | Status: DC
  Administered 2017-07-11: 11:00:00 via INTRAVENOUS

## 2017-07-11 MED ORDER — MIDAZOLAM HCL 5 MG/5ML IJ SOLN
INTRAMUSCULAR | Status: DC | PRN
  Administered 2017-07-11: 2 mg via INTRAVENOUS

## 2017-07-11 MED ORDER — DEXAMETHASONE SODIUM PHOSPHATE 4 MG/ML IJ SOLN
INTRAMUSCULAR | Status: DC | PRN
  Administered 2017-07-11: 4 mg via INTRAVENOUS

## 2017-07-11 MED ORDER — LIDOCAINE HCL (CARDIAC) 20 MG/ML IV SOLN
INTRAVENOUS | Status: DC | PRN
  Administered 2017-07-11: 40 mg via INTRAVENOUS

## 2017-07-11 MED ORDER — FENTANYL CITRATE (PF) 100 MCG/2ML IJ SOLN
25.0000 ug | INTRAMUSCULAR | Status: AC | PRN
  Administered 2017-07-11 (×4): 25 ug via INTRAVENOUS

## 2017-07-11 MED ORDER — EPHEDRINE SULFATE 50 MG/ML IJ SOLN
INTRAMUSCULAR | Status: DC | PRN
  Administered 2017-07-11 (×4): 10 mg via INTRAVENOUS

## 2017-07-11 MED ORDER — PROPOFOL 10 MG/ML IV BOLUS
INTRAVENOUS | Status: DC | PRN
  Administered 2017-07-11: 200 mg via INTRAVENOUS

## 2017-07-11 MED ORDER — ONDANSETRON 4 MG PO TBDP
4.0000 mg | ORAL_TABLET | Freq: Once | ORAL | Status: AC | PRN
  Administered 2017-07-11: 4 mg via ORAL

## 2017-07-11 MED ORDER — ASPIRIN EC 325 MG PO TBEC: 325.0000 mg | DELAYED_RELEASE_TABLET | Freq: Every day | ORAL | 0 refills | Status: AC

## 2017-07-11 MED ORDER — OXYCODONE HCL 5 MG PO TABS
5.0000 mg | ORAL_TABLET | Freq: Once | ORAL | Status: AC | PRN
  Administered 2017-07-11: 5 mg via ORAL

## 2017-07-11 MED ORDER — FENTANYL CITRATE (PF) 100 MCG/2ML IJ SOLN
INTRAMUSCULAR | Status: DC | PRN
  Administered 2017-07-11 (×2): 100 ug via INTRAVENOUS

## 2017-07-11 MED ORDER — ONDANSETRON 4 MG PO TBDP: 4.0000 mg | ORAL_TABLET | Freq: Three times a day (TID) | ORAL | 0 refills | Status: DC | PRN

## 2017-07-11 MED ORDER — HYDROMORPHONE HCL 1 MG/ML IJ SOLN: 0.5000 mg | INTRAMUSCULAR | Status: DC

## 2017-07-11 MED ORDER — IBUPROFEN 800 MG PO TABS: 800.0000 mg | ORAL_TABLET | Freq: Three times a day (TID) | ORAL | 0 refills | Status: AC

## 2017-07-11 MED ORDER — DEXMEDETOMIDINE HCL 200 MCG/2ML IV SOLN
INTRAVENOUS | Status: DC | PRN
  Administered 2017-07-11: 8 ug via INTRAVENOUS

## 2017-07-11 MED ORDER — OXYCODONE HCL 5 MG/5ML PO SOLN: 5.0000 mg | Freq: Once | ORAL | Status: AC | PRN

## 2017-07-11 MED ORDER — BUPIVACAINE HCL (PF) 0.5 % IJ SOLN
INTRAMUSCULAR | Status: DC | PRN
  Administered 2017-07-11: 10 mL

## 2017-07-11 MED ORDER — SCOPOLAMINE 1 MG/3DAYS TD PT72
1.0000 | MEDICATED_PATCH | TRANSDERMAL | Status: DC
  Administered 2017-07-11: 1.5 mg via TRANSDERMAL

## 2017-07-11 MED ORDER — HYDROCODONE-ACETAMINOPHEN 5-325 MG PO TABS: 1.0000 | ORAL_TABLET | ORAL | 0 refills | Status: DC | PRN

## 2017-07-11 MED ORDER — ACETAMINOPHEN 10 MG/ML IV SOLN
1000.0000 mg | Freq: Once | INTRAVENOUS | Status: AC
  Administered 2017-07-11: 1000 mg via INTRAVENOUS

## 2017-07-11 SURGICAL SUPPLY — 11 items
CANISTER SUCT 1200ML W/VALVE (MISCELLANEOUS) ×2 IMPLANT
GAUZE PETRO XEROFOAM 5X9 (MISCELLANEOUS) ×2 IMPLANT
NS IRRIG 500ML POUR BTL (IV SOLUTION) ×2 IMPLANT
STERILE HIP PROSTHESIS PACK ×2 IMPLANT
STRIP CLOSURE SKIN 1/2X4 (GAUZE/BANDAGES/DRESSINGS) ×2 IMPLANT
SUT MNCRL 4-0 (SUTURE) ×1
SUT MNCRL 4-0 27XMFL (SUTURE) ×1
SUT VIC AB 0 CT1 36 (SUTURE) ×2 IMPLANT
SUT VIC AB 2-0 CT2 27 (SUTURE) ×4 IMPLANT
SUTURE MNCRL 4-0 27XMF (SUTURE) ×1 IMPLANT
SYR 20CC LL (SYRINGE) ×2 IMPLANT

## 2017-07-11 NOTE — Op Note (Signed)
DATE OF SURGERY: 07/11/2017  PREOPERATIVE DIAGNOSIS:  1. Left hip trochanteric bursitis 2. Left hip Iliotibial band tightness  POSTOPERATIVE DIAGNOSIS:  1. Left hip trochanteric bursitis 2. Left hip Iliotibial band tightness  PROCEDURE:  1. Left hip open trochanteric bursectomy 2. Left hip open Iliotibial band release  SURGEON: Cato Mulligan, MD  ASSISTANTS: none  EBL: 10cc  INDICATIONS: Sarah Chaney is a 56 y.o. female who has had chronic L lateral hip pain. She has had excellent relief of pain with corticosteroid injection, but this only lasted a few weeks. She has had physical therapy without adequate relief. Pain affects her quality of life and work. Prior MRI showed normal gluteus musculature without tear. After discussion of risks, benefits, and alternatives to surgery, the patient elected to proceed with above procedure.   PROCEDURE:  The patient was identified in the preoperative holding area and the operative extremity was marked.  The patient was then transferred to the operating room suite and mobilized from the hospital gurney to the operating room table. Anesthesia was administered without complication. The patient was then transitioned to a lateral position.  All bony prominences were padded per protocol. An axillary roll was placed.  Careful attention was paid to the contralateral side peroneal nerve, which was free from pressure with use of appropriate padding and blankets. A time-out was performed to confirm the patient's identity and the correct laterality of surgery. The patient was then prepped and draped in the usual sterile fashion. Appropriate pre-operative antibiotics were administered.  An incision that centered on the posterior tip of the greater trochanter was made. Dissection was carried down through the subcutaneous tissue.  Careful attention was made to maintain hemostasis using electrocautery.  Dissection brought [place] to the level of IT band. The IT band was  incised in linear fashion over the most prominent aspect of the trochanter. As this was extended proximally, it became too anterior so a posterior limb was made proximally for better visualization. The trochanteric bursa was then visualized and was completely excised until short external rotators were completely visualized. Excision was performed with a combination of scissors and electrocautery. There were no osteophytes or bony prominences on the greater trochanter. The gluteus medius insertion was intact.    The wound was then thoroughly irrigated. The posterior limb of the proximal IT band was repaired with 0-Vicryl suture in a running fashion. The longitudinal split was left open as this consisted of the IT band release. There was ~2-3cm of gapping between the anterior and posterior IT band fibers at the level of the split. The subdermal layer was closed with 2-0 Vicryl in a buried interrupted fashion. Skin was approximated 4-0 monocryl.  The wound was then dressed with steristrips, xeroform, and Honeycomb dressing. The patient was mobilized from the lateral position back to supine on the operating room table and then awakened from general anesthesia without complication.  POSTOPERATIVE PLAN: The patient will be FFWB x 2 weeks and then WBAT on operative extremity. Hip abduction brace to limit active abduction x 2 weeks. ASA 325mg /day x 2 weeks for DVT ppx. PT/OT on POD#3-4. Rehab protocol below.    Cato Mulligan, MD [facility] Phone: [phone] Fax: [phone]  TROCHANTERIC BURSECTOMY/ILIOTIBIAL [place] _____________________________________________________________________________________________ These guidelines should be tailored to individual patients based on their rehab goals, age, precautions, quality of repair, etc.  Progression should be based on patient progress and approval by the referring physician.  General Guidelines:  . Normalize gait  pattern with crutches  . Weight-bearing: 50% WB x 2 weeks, then advance to full (unless otherwise specified) Rehabilitation Goals:  . _Seen 2x/week for 6 weeks   Precautions following Hip Surgery:  . Jeani Hawking will be determined by procedure  . _Hip flexor tendonitis  . _Trochanteric bursitis  . _Synovitis  . _Manage scarring around incision/portal sites  . _Increase range of motion focusing on flexion  . _No active abduction, no adduction & extension beyond neutral, and no active ER (at least 2 weeks),    Guidelines: Weeks 0-4  . _Bike for 20 minutes/day (can be 2x/day)  . _Aggressive Scar massage to prevent adhesions/recurrence  . _Hip PROM  o Hip flexion to 90 degrees, abduction as tolerated  o No active abduction x 2 weeks  . _Quadruped rocking for hip flexion  . _Gait training PWB with assistive device  . _Hip isometrics  o Extension, adduction, ER at 2 weeks  . _Hamstring isotonics  . _Pelvic tilts  . _NMES to quads with SAQ  . _Modalities   Weeks 4-6  . Jillyn Hidden Scar massage to prevent adhesions/recurrence  . _Continue with previous exercises  . _Progress with passive hip flexion greater than 90 degrees  . _Supine bridges  . _Isotonic adduction  . _Progress core strengthening (avoid hip flexor tendonitis)  . _Progress with hip strengthening  o Start isometric sub max pain free hip flexion (3-4 wks)  o Quadriceps strengthening  . _Aqua therapy in low end of water   Weeks 6-10  . Jillyn Hidden Scar massage to prevent adhesions/recurrence  . _Continue with previous exercises  . _Progress with ROM  o Passive hip ER/IR  . _Supine log rolling -> Stool rotation -> Standing on BAPS  o Hip Joint mobs with mobilization belt (if needed)  . _Lateral and inferior with rotation  . _Prone posterior-anterior glides with rotation  o Progress core strengthening (avoid hip flexor tendonitis)  . _Continue previous exercises  . _Progress strengthening LE  o Hip isometrics  for abduction and progress to isotonics  o Leg press (bilateral LE)  o Isokinetics: knee flexion/extension  . _Progress core strengthening  . _Begin proprioception/balance  o Balance board and single leg stance  . _Bilateral cable column rotations  . _Elliptical   Weeks 10-12  . _Continue with previous exercises  . _Progressive hip ROM  . _Progressive LE and core strengthening o Hip PREs and hip machine o Unilateral Leg press o Unilateral cable column rotations  . _Hip Hiking  o Step downs  . _Hip flexor, glute/piriformis, and It-band Stretching - manual and self    . _Progress balance and proprioception  . _Bilateral -> Unilateral -> foam -> dynadisc  . _Treadmill side stepping from level surface holding on progressing to inclines  . _Side stepping with theraband  . _Hip hiking on stairmaster (week 12)   Weeks 12 +  . _Progressive hip ROM and stretching  . _Progressive LE and core strengthening  . _Endurance activities around the hip  . _Dynamic balance activities  . _Treadmill jogging/running program  . _Sport specific agility drills and plyometrics   3-6 months Re-Evaluate (Criteria for discharge if athlete)  . _Hip Outcome Score  . _Pain free or at least a manageable level of discomfort  . _MMT within 10 percent of uninvolved LE  . _Biodex test of Quadriceps and Hamstrings peak torque within 15 percent of uninvolved  . _Single leg cross-over triple hop for distance:  o Score of less than 85% are considered  abnormal for female and female  . _Step down test   (Protocol courtesy of Dr. Colin Broach)

## 2017-07-11 NOTE — Anesthesia Procedure Notes (Signed)
Procedure Name: Intubation Date/Time: 07/11/2017 12:19 PM Performed by: Janna Arch, CRNA Pre-anesthesia Checklist: Patient identified, Emergency Drugs available, Suction available and Patient being monitored Patient Re-evaluated:Patient Re-evaluated prior to induction Oxygen Delivery Method: Circle system utilized Preoxygenation: Pre-oxygenation with 100% oxygen Induction Type: IV induction Ventilation: Mask ventilation without difficulty Laryngoscope Size: Mac and 3 Grade View: Grade II Tube type: Oral Tube size: 7.0 mm Number of attempts: 1 Airway Equipment and Method: Bougie stylet (unable to pass ett, bougie used, tube passed easiily) Placement Confirmation: ETT inserted through vocal cords under direct vision,  positive ETCO2 and breath sounds checked- equal and bilateral Secured at: 23 cm Tube secured with: Tape Dental Injury: Teeth and Oropharynx as per pre-operative assessment

## 2017-07-11 NOTE — Transfer of Care (Signed)
Immediate Anesthesia Transfer of Care Note  Patient: Sarah Chaney  Procedure(s) Performed: HIP OPEN TROCHANTERIC  BURSECTOMY  with IT Band realse. (Left )  Patient Location: PACU  Anesthesia Type: General  Level of Consciousness: awake, alert  and patient cooperative  Airway and Oxygen Therapy: Patient Spontanous Breathing and Patient connected to supplemental oxygen  Post-op Assessment: Post-op Vital signs reviewed, Patient's Cardiovascular Status Stable, Respiratory Function Stable, Patent Airway and No signs of Nausea or vomiting  Post-op Vital Signs: Reviewed and stable  Complications: No apparent anesthesia complications

## 2017-07-11 NOTE — Anesthesia Postprocedure Evaluation (Signed)
Anesthesia Post Note  Patient: Sarah Chaney  Procedure(s) Performed: HIP OPEN TROCHANTERIC  BURSECTOMY  with IT Band realse. (Left )  Patient location during evaluation: PACU Anesthesia Type: General Level of consciousness: awake and alert Pain management: pain level controlled Vital Signs Assessment: post-procedure vital signs reviewed and stable Respiratory status: spontaneous breathing, nonlabored ventilation, respiratory function stable and patient connected to nasal cannula oxygen Cardiovascular status: blood pressure returned to baseline and stable Postop Assessment: no apparent nausea or vomiting Anesthetic complications: no    Amire Gossen

## 2017-07-11 NOTE — Discharge Instructions (Signed)
Open Hip Surgery  Post-Op Instructions  1. Bracing or crutches: Crutches will be provided at the time of discharge from the surgery center.   2. Ice: You may be provided with a device [facility]) that allows you to ice the affected area effectively. Otherwise you can ice manually.   3. Driving:  Plan on not driving for at least one week. Please note that you are advised NOT to drive while taking narcotic pain medications as you may be impaired and unsafe to drive.  4. Activity: Ankle pumps several times an hour while awake to prevent blood clots. Weight bearing: TTWB or FFWB x 1 week, then 50% WB for weeks 1-2. Use crutches for at least 2 weeks. Avoid standing more than 5 minutes (consecutively) for the first week. No exercise involving the hip until cleared by the surgeon or physical therapist.   5. Medications:  - You have been provided a prescription for narcotic pain medicine. After surgery, take 1-2 narcotic tablets every 4 hours if needed for severe pain.  - A prescription for anti-nausea medication will be provided in case the narcotic medicine causes nausea - take 1 tablet every 6 hours only if nauseated.  - Take ibuprofen 800 mg every 8 hours with food to reduce post-operative knee swelling.   - Take enteric coated aspirin 325 mg once daily for 2 weeks to prevent blood clots.    If you are taking prescription medication for anxiety, depression, insomnia, muscle spasm, chronic pain, or for attention deficit disorder you are advised that you are at a higher risk of adverse effects with use of narcotics post-op, including narcotic addiction/dependence, depressed breathing, death. If you use non-prescribed substances: alcohol, marijuana, cocaine, heroin, methamphetamines, etc., you are at a higher risk of adverse effects with use of narcotics post-op, including narcotic addiction/dependence, depressed breathing, death. You are advised that taking > 50 morphine milligram equivalents  (MME) of narcotic pain medication per day results in twice the risk of overdose or death. For your prescription provided: oxycodone 5 mg - taking more than 6 tablets per day. Be advised that we will prescribe narcotics short-term, for acute post-operative pain only - 1 week for minor operations such as knee arthroscopy for meniscus tear resection, and 3 weeks for major operations such as knee repair/reconstruction surgeries.   6. Bandages: The physical therapist should change the bandages at the first post-op appointment. If needed, the dressing supplies have been provided to you.  7. Physical Therapy: 2 times per week for the first 4 weeks, then 1-2 times per week from weeks 4-12 post-op. Therapy typically starts on post operative Day 3 or 4. You have been provided an order for physical therapy. The therapist will provide home exercises.  8. Work: May return to full work when off of crutches. May do light duty/desk job in approximately 1-2 weeks when off of narcotics, pain is well-controlled.  9. Post-Op Appointments: Your first post-op appointment will be with Dr. Posey Pronto in approximately 2 weeks time.   If you find that they have not been scheduled please call the Orthopaedic Appointment front desk at [phone].     General Anesthesia, Adult, Care After These instructions provide you with information about caring for yourself after your procedure. Your health care provider may also give you more specific instructions. Your treatment has been planned according to current medical practices, but problems sometimes occur. Call your health care provider if you have any problems or questions after your procedure. What can I  expect after the procedure? After the procedure, it is common to have:  Vomiting.  A sore throat.  Mental slowness.  It is common to feel:  Nauseous.  Cold or shivery.  Sleepy.  Tired.  Sore or achy, even in parts of your body where you did not have  surgery.  Follow these instructions at home: For at least 24 hours after the procedure:  Do not: ? Participate in activities where you could fall or become injured. ? Drive. ? Use heavy machinery. ? Drink alcohol. ? Take sleeping pills or medicines that cause drowsiness. ? Make important decisions or sign legal documents. ? Take care of children on your own.  Rest. Eating and drinking  If you vomit, drink water, juice, or soup when you can drink without vomiting.  Drink enough fluid to keep your urine clear or pale yellow.  Make sure you have little or no nausea before eating solid foods.  Follow the diet recommended by your health care provider. General instructions  Have a responsible adult stay with you until you are awake and alert.  Return to your normal activities as told by your health care provider. Ask your health care provider what activities are safe for you.  Take over-the-counter and prescription medicines only as told by your health care provider.  If you smoke, do not smoke without supervision.  Keep all follow-up visits as told by your health care provider. This is important. Contact a health care provider if:  You continue to have nausea or vomiting at home, and medicines are not helpful.  You cannot drink fluids or start eating again.  You cannot urinate after 8-12 hours.  You develop a skin rash.  You have fever.  You have increasing redness at the site of your procedure. Get help right away if:  You have difficulty breathing.  You have chest pain.  You have unexpected bleeding.  You feel that you are having a life-threatening or urgent problem. This information is not intended to replace advice given to you by your health care provider. Make sure you discuss any questions you have with your health care provider. Document Released: 10/17/2000 Document Revised: 12/14/2015 Document Reviewed: 06/25/2015 Elsevier Interactive Patient Education   [employer].

## 2017-07-11 NOTE — H&P (Signed)
Paper H&P to be scanned into permanent record. H&P reviewed. No significant changes noted.  

## 2017-07-11 NOTE — Anesthesia Preprocedure Evaluation (Addendum)
Anesthesia Evaluation  Patient identified by MRN, date of birth, ID band  Reviewed: NPO status   History of Anesthesia Complications (+) PONV and history of anesthetic complications  Airway Mallampati: II  TM Distance: >3 FB Neck ROM: Full    Dental no notable dental hx.    Pulmonary neg pulmonary ROS, former smoker,    Pulmonary exam normal breath sounds clear to auscultation       Cardiovascular Exercise Tolerance: Good hypertension, Normal cardiovascular exam Rhythm:Regular Rate:Normal  echo stress: 2017:  Normal Stress Echocardiogram NORMAL RIGHT VENTRICULAR SYSTOLIC FUNCTION NO VALVULAR STENOSIS NOTED Resting EF: >55% ;   Cards stable: 09/2016: dr. Ubaldo Glassing;   History of palpitations-etiology of palpitations unclear. Holter monitor 2017 showed no significant abnormalities   Neuro/Psych  Headaches, Seizures - (last age 56),  negative psych ROS   GI/Hepatic Neg liver ROS, GERD  Controlled,IBS   Endo/Other  diabetesHypothyroidism Morbid obesity (bmi=36)  Renal/GU negative Renal ROS  negative genitourinary   Musculoskeletal  (+) Arthritis ,   Abdominal   Peds  Hematology negative hematology ROS (+)   Anesthesia Other Findings   Reproductive/Obstetrics                            Anesthesia Physical  Anesthesia Plan  ASA: II  Anesthesia Plan: General   Post-op Pain Management:    Induction: Intravenous  PONV Risk Score and Plan:   Airway Management Planned:   Additional Equipment:   Intra-op Plan:   Post-operative Plan: Extubation in OR  Informed Consent: I have reviewed the patients History and Physical, chart, labs and discussed the procedure including the risks, benefits and alternatives for the proposed anesthesia with the patient or authorized representative who has indicated his/her understanding and acceptance.   Dental advisory given  Plan Discussed with:  CRNA  Anesthesia Plan Comments:       Anesthesia Quick Evaluation

## 2017-07-12 ENCOUNTER — Encounter: Payer: Self-pay | Admitting: Orthopedic Surgery

## 2017-07-13 ENCOUNTER — Encounter: Payer: Self-pay | Admitting: Orthopedic Surgery

## 2017-08-11 ENCOUNTER — Other Ambulatory Visit: Payer: Self-pay | Admitting: Physician Assistant

## 2017-08-11 DIAGNOSIS — Z1231 Encounter for screening mammogram for malignant neoplasm of breast: Secondary | ICD-10-CM

## 2017-08-25 ENCOUNTER — Ambulatory Visit
Admission: RE | Admit: 2017-08-25 | Discharge: 2017-08-25 | Disposition: A | Discharge status: Home / Self Care | Payer: 59 | Source: Ambulatory Visit | Attending: Physician Assistant | Admitting: Physician Assistant

## 2017-08-25 DIAGNOSIS — Z1231 Encounter for screening mammogram for malignant neoplasm of breast: Secondary | ICD-10-CM | POA: Insufficient documentation

## 2017-10-11 ENCOUNTER — Other Ambulatory Visit: Payer: Self-pay | Admitting: Sports Medicine

## 2017-10-11 DIAGNOSIS — M4726 Other spondylosis with radiculopathy, lumbar region: Secondary | ICD-10-CM

## 2017-10-11 DIAGNOSIS — M4310 Spondylolisthesis, site unspecified: Secondary | ICD-10-CM

## 2017-10-11 DIAGNOSIS — M5136 Other intervertebral disc degeneration, lumbar region: Secondary | ICD-10-CM

## 2017-10-19 ENCOUNTER — Ambulatory Visit
Admission: RE | Admit: 2017-10-19 | Discharge: 2017-10-19 | Disposition: A | Discharge status: Home / Self Care | Payer: 59 | Source: Ambulatory Visit | Attending: Sports Medicine | Admitting: Sports Medicine

## 2017-10-19 DIAGNOSIS — M4726 Other spondylosis with radiculopathy, lumbar region: Secondary | ICD-10-CM | POA: Diagnosis not present

## 2017-10-19 DIAGNOSIS — M5136 Other intervertebral disc degeneration, lumbar region: Secondary | ICD-10-CM | POA: Diagnosis present

## 2017-10-19 DIAGNOSIS — M4316 Spondylolisthesis, lumbar region: Secondary | ICD-10-CM | POA: Diagnosis not present

## 2017-10-19 DIAGNOSIS — M4317 Spondylolisthesis, lumbosacral region: Secondary | ICD-10-CM | POA: Diagnosis not present

## 2017-10-19 DIAGNOSIS — M4310 Spondylolisthesis, site unspecified: Secondary | ICD-10-CM

## 2017-11-29 ENCOUNTER — Emergency Department
Admission: EM | Admit: 2017-11-29 | Discharge: 2017-11-29 | Disposition: A | Discharge status: Home / Self Care | Payer: 59 | Attending: Emergency Medicine | Admitting: Emergency Medicine

## 2017-11-29 ENCOUNTER — Other Ambulatory Visit: Payer: Self-pay

## 2017-11-29 ENCOUNTER — Encounter: Payer: Self-pay | Admitting: Emergency Medicine

## 2017-11-29 ENCOUNTER — Emergency Department: Payer: 59

## 2017-11-29 DIAGNOSIS — G43009 Migraine without aura, not intractable, without status migrainosus: Secondary | ICD-10-CM | POA: Diagnosis not present

## 2017-11-29 DIAGNOSIS — Z79899 Other long term (current) drug therapy: Secondary | ICD-10-CM | POA: Insufficient documentation

## 2017-11-29 DIAGNOSIS — E039 Hypothyroidism, unspecified: Secondary | ICD-10-CM | POA: Diagnosis not present

## 2017-11-29 DIAGNOSIS — Z794 Long term (current) use of insulin: Secondary | ICD-10-CM | POA: Diagnosis not present

## 2017-11-29 DIAGNOSIS — E119 Type 2 diabetes mellitus without complications: Secondary | ICD-10-CM | POA: Insufficient documentation

## 2017-11-29 DIAGNOSIS — Z87891 Personal history of nicotine dependence: Secondary | ICD-10-CM | POA: Diagnosis not present

## 2017-11-29 DIAGNOSIS — H21562 Pupillary abnormality, left eye: Secondary | ICD-10-CM | POA: Diagnosis not present

## 2017-11-29 DIAGNOSIS — I10 Essential (primary) hypertension: Secondary | ICD-10-CM | POA: Diagnosis not present

## 2017-11-29 DIAGNOSIS — R51 Headache: Secondary | ICD-10-CM | POA: Diagnosis present

## 2017-11-29 LAB — BASIC METABOLIC PANEL
Anion gap: 8 (ref 5–15)
BUN: 15 mg/dL (ref 6–20)
CO2: 24 mmol/L (ref 22–32)
CREATININE: 0.7 mg/dL (ref 0.44–1.00)
Calcium: 9.1 mg/dL (ref 8.9–10.3)
Chloride: 104 mmol/L (ref 101–111)
GFR calc non Af Amer: >60 mL/min (ref >60)
Glucose, Bld: 151 mg/dL — ABNORMAL HIGH (ref 65–99)
Potassium: 4.2 mmol/L (ref 3.5–5.1)
Sodium: 136 mmol/L (ref 135–145)

## 2017-11-29 LAB — CBC WITH DIFFERENTIAL/PLATELET
Basophils Absolute: 0.1 10*3/uL (ref 0–0.1)
Basophils Relative: 1 %
EOS ABS: 0.3 10*3/uL (ref 0–0.7)
Eosinophils Relative: 5 %
HEMATOCRIT: 42.2 % (ref 35.0–47.0)
HEMOGLOBIN: 13.8 g/dL (ref 12.0–16.0)
LYMPHS ABS: 1.9 10*3/uL (ref 1.0–3.6)
Lymphocytes Relative: 25 %
MCH: 25.6 pg — AB (ref 26.0–34.0)
MCHC: 32.6 g/dL (ref 32.0–36.0)
MCV: 78.4 fL — ABNORMAL LOW (ref 80.0–100.0)
Monocytes Absolute: 0.7 10*3/uL (ref 0.2–0.9)
Monocytes Relative: 9 %
NEUTROS PCT: 60 %
Neutro Abs: 4.4 10*3/uL (ref 1.4–6.5)
Platelets: 186 10*3/uL (ref 150–440)
RBC: 5.38 MIL/uL — ABNORMAL HIGH (ref 3.80–5.20)
RDW: 16.2 % — ABNORMAL HIGH (ref 11.5–14.5)
WBC: 7.5 10*3/uL (ref 3.6–11.0)

## 2017-11-29 LAB — PROTIME-INR
INR: 0.87
Prothrombin Time: 11.7 seconds (ref 11.4–15.2)

## 2017-11-29 MED ORDER — ACETAMINOPHEN 500 MG PO TABS
ORAL_TABLET | ORAL | Status: AC
  Administered 2017-11-29: 1000 mg via ORAL
  Filled 2017-11-29: qty 2

## 2017-11-29 MED ORDER — IOPAMIDOL (ISOVUE-370) INJECTION 76%
75.0000 mL | Freq: Once | INTRAVENOUS | Status: AC | PRN
  Administered 2017-11-29: 75 mL via INTRAVENOUS

## 2017-11-29 MED ORDER — ACETAMINOPHEN 500 MG PO TABS
1000.0000 mg | ORAL_TABLET | Freq: Once | ORAL | Status: AC
  Administered 2017-11-29: 1000 mg via ORAL

## 2017-11-29 NOTE — ED Provider Notes (Addendum)
[facility] Emergency Department Provider Note  ____________________________________________   I have reviewed the triage vital signs and the nursing notes. Where available I have reviewed prior notes and, if possible and indicated, outside hospital notes.    HISTORY  Chief Complaint Headache    HPI Sarah Chaney is a 57 y.o. female with a history of chronic recurrent headaches negative CT scan in 2015, has migraines with aura, had a usual migraine with aura which was "mild" yesterday.  Has a slight residual headache today.  Not worst of life, gradual in onset.  Normal treatment with ibuprofen made to go away.  However she noted this morning that her left pupil was dilated and this is atypical for her.  She has not had this with migraines in the past.  She denies actually having any significant headache she has a slight "pressure" sensation in her head but she does not feel bad otherwise.  She denies any nausea vomiting diarrhea, no stiff neck, headache was not worst of life it is a completely routine mild headache for her.  She states that she has no neurologic deficit no difficulty talking or walking    Past Medical History:  Diagnosis Date  . Adenomatous colon polyp 1997  . Anemia, unspecified   . Celiac disease   . Chondromalacia of patella   . Colon polyps    HX OF  . Complication of anesthesia   . Diabetes mellitus ([place])    type 2  . Disorders of bursae and tendons in shoulder region, unspecified   . Diverticulosis   . Elevated anti-tissue transglutaminase (tTG) IgA level   . Gastric polyps   . GERD (gastroesophageal reflux disease)   . Hashimoto's disease   . Headache(784.0)   . Irritable bowel syndrome    postprandial abdominal pain and cramping  . Lateral epicondylitis  of elbow    FOOT  . Nausea   . Neuralgia, neuritis, and radiculitis, unspecified   . Obesity, unspecified   . Palpitations   . Persistent disorder of initiating or  maintaining sleep   . Personal history of tobacco use, presenting hazards to health   . PONV (postoperative nausea and vomiting)   . Pure hypercholesterolemia   . Restless legs syndrome (RLS)   . Seizures ([place])    HX OF AT AGE 23, EPILEPSY, 4 GRAND MALL SEISURES. WEANED OFF AT AGE 22  . Tachycardia, unspecified   . 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 Unspecified essential hypertension    CONTROLLED ON MEDS  . Unspecified hypothyroidism   . Unspecified polyarthropathy or polyarthritis, multiple sites   . Variants of migraine, not elsewhere classified, without mention of intractable migraine without mention of status migrainosus   . Wears contact lenses    left eye    Patient Active Problem List   Diagnosis Date Noted  . Encounter for routine gynecological examination 05/13/2015  . Chest wall pain 02/24/2015  . Palpitations 08/20/2014  . Dizzinesses 04/25/2014  . Headache(784.0) 02/20/2014  . Lightheaded 02/13/2014  . Pain in joint, ankle and foot 12/18/2013  . Left eye pain 05/29/2013  . Abnormal transaminases 10/08/2012  . Hashimoto's thyroiditis 10/03/2012  . History of pyelonephritis 06/24/2012  . Routine general medical examination at a health care facility 06/26/2011  . Other screening mammogram 04/22/2011  . RESTLESS LEG SYNDROME 08/19/2010  . Migraine with aura 08/19/2010  . 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 UNSPECIFIED NEURALGIA NEURITIS AND RADICULITIS 06/30/2009  . SEIZURES, HX OF 05/20/2009  .  ROTATOR CUFF SYNDROME, LEFT 05/05/2009  . POLYARTHRITIS 07/03/2008  . PERIMENOPAUSAL STATUS 07/03/2008  . PURE HYPERCHOLESTEROLEMIA 05/25/2007  . INSOMNIA, CHRONIC 03/23/2007  . PAIN IN JOINT, MULTIPLE SITES 03/23/2007  . OBESITY 03/20/2007  . ANEMIA-NOS 03/20/2007  . Essential hypertension 03/20/2007  . GERD 03/20/2007  . IBS 03/20/2007  . FATTY LIVER DISEASE 03/20/2007  . POSITIVE PPD 03/20/2007  . COLONIC POLYPS, HX OF 03/20/2007  . TOBACCO USE, QUIT 03/20/2007     Past Surgical History:  Procedure Laterality Date  . ABD [place]  12/2002   Fatty liver  . ABD [place]  05/2010   Fatty liver, normal gall bladder and pancreas  . ABDOMINOPLASTY    . BREAST BIOPSY Right 1992   excisional negative biopsy  . CESAREAN SECTION     x2  . COLONOSCOPY  12/2001   Negative  . ESOPHAGOGASTRODUODENOSCOPY  12/2001  . ESOPHAGOGASTRODUODENOSCOPY  11/2007   nml  . ESOPHAGOGASTRODUODENOSCOPY N/A 12/16/2015   Procedure: ESOPHAGOGASTRODUODENOSCOPY (EGD);  Surgeon: Hulen Luster, MD;  Location: [place];  Service: Gastroenterology;  Laterality: N/A;  DIABETIC-ORAL MED  . exercise treadmill  5/13   normal  . KNEE BURSECTOMY Left 07/11/2017   Procedure: HIP OPEN TROCHANTERIC  BURSECTOMY  with IT Band realse.;  Surgeon: Leim Fabry, MD;  Location: [place];  Service: Orthopedics;  Laterality: Left;  Diabetic - insulin and oral meds  . POLYPECTOMY  12/2001  . POPLITEAL SYNOVIAL CYST EXCISION  12/2003  . REDUCTION MAMMAPLASTY Bilateral 1991  . TONSILLECTOMY    . TOOTH EXTRACTION  1978   root canal x3    Prior to Admission medications   Medication Sig Start Date End Date Taking? Authorizing Provider  BAYER MICROLET LANCETS lancets Use to test blood sugar 4 times daily as instructed. 09/16/14   Philemon Kingdom, MD  Biotin 10 MG TABS Take 1 tablet by mouth daily. AM    [provider]  Blood Glucose Monitoring Suppl (BAYER CONTOUR NEXT MONITOR) W/DEVICE KIT Use to test blood sugar 4 times daily. 09/16/14   Philemon Kingdom, MD  canagliflozin (INVOKANA) 300 MG TABS tablet TAKE 1 TABLET BY MOUTH ONCE DAILY IN THE MORNING 08/20/15   Philemon Kingdom, MD  glucose blood (BAYER CONTOUR NEXT TEST) test strip Use to test blood sugar 4 times daily as instructed. 09/16/14   Philemon Kingdom, MD  HYDROcodone-acetaminophen (NORCO) 5-325 MG tablet Take 1-2 tablets by mouth every 4 (four) hours as needed for moderate pain or severe pain. 07/11/17   Leim Fabry, MD   Insulin Degludec (TRESIBA FLEXTOUCH [place]) Inject 52 Units into the skin daily.    [provider]  levothyroxine (SYNTHROID, LEVOTHROID) 125 MCG tablet TAKE 1 TABLET BY MOUTH DAILY BEFORE BREAKFAST. Patient taking differently: TAKE 1 TABLET BY MOUTH DAILY BEFORE BREAKFAST. - takes 137 mcg 07/06/15   Philemon Kingdom, MD  losartan (COZAAR) 25 MG tablet Take 25 mg by mouth daily.    [provider]  metFORMIN (GLUCOPHAGE XR) 500 MG 24 hr tablet Take 1 tablet (500 mg total) by mouth daily with breakfast. Patient taking differently: Take 1,000 mg by mouth 2 (two) times daily. AM 06/12/15   Philemon Kingdom, MD  metoprolol (LOPRESSOR) 50 MG tablet TAKE 1 TABLET BY MOUTH TWICE DAILY Patient taking differently: TAKE 1 TABLET ONCE A DAY, TWO IF NEEDED 06/01/15   Minna Merritts, MD  omeprazole (PRILOSEC) 40 MG capsule Take 1 capsule (40 mg total) by mouth 2 (two) times daily. **needs to schedule  appt with doctor before future refills are given 07/12/16   Tower, Wynelle Fanny, MD  ondansetron (ZOFRAN ODT) 4 MG disintegrating tablet Take 1 tablet (4 mg total) by mouth every 8 (eight) hours as needed for nausea or vomiting. 07/11/17   Leim Fabry, MD  rizatriptan (MAXALT) 10 MG tablet Take 10 mg by mouth as needed for migraine. May repeat in 2 hours if needed    [provider]  rosuvastatin (CRESTOR) 5 MG tablet Take 5 mg by mouth daily.    [provider]  zolpidem (AMBIEN) 10 MG tablet TAKE ONE TABLET BY MOUTH AT BEDTIME AS NEEDED FOR SLEEP 07/27/16   Tower, Wynelle Fanny, MD    Allergies Carbamazepine; Cycloset [bromocriptine]; Diltiazem hcl; Lisinopril; Metformin and related; Nifedipine; and Verapamil  Family History  Problem Relation Age of Onset  . Diabetes type II Father   . Diabetes Mother   . Hyperlipidemia Mother   . Thyroid disease Mother   . Diabetes type II Mother   . Diabetes Unknown        neice  . Breast cancer Maternal Aunt 65    Social History Social  History   Tobacco Use  . Smoking status: Former Smoker    Packs/day: 0.30    Years: 32.00    Pack years: 9.60    Types: Cigarettes    Last attempt to quit: 11/08/2011    Years since quitting: 6.0  . Smokeless tobacco: Never Used  Substance Use Topics  . Alcohol use: No    Alcohol/week: 0.0 oz    Comment: rare  . Drug use: No    Review of Systems Constitutional: No fever/chills Eyes: No visual changes. ENT: No sore throat. No stiff neck no neck pain Cardiovascular: Denies chest pain. Respiratory: Denies shortness of breath. Gastrointestinal:   no vomiting.  No diarrhea.  No constipation. Genitourinary: Negative for dysuria. Musculoskeletal: Negative lower extremity swelling Skin: Negative for rash. Neurological: Negative for severe headaches, focal weakness or numbness.   ____________________________________________   PHYSICAL EXAM:  VITAL SIGNS: ED Triage Vitals [11/29/17 0831]  Enc Vitals Group     BP (!) 165/97     Pulse Rate 92     Resp 18     Temp 97.9 F (36.6 C)     Temp Source Oral     SpO2 95 %     Weight 220 lb (99.8 kg)     Height _0  (1.727 m)     Head Circumference      Peak Flow      Pain Score 3     Pain Loc      Pain Edu?      Excl. in [place]?     Constitutional: Alert and oriented. Well appearing and in no acute distress. Eyes: Conjunctivae are normal Head: Atraumatic HEENT: No congestion/rhinnorhea. Mucous membranes are moist.  Oropharynx non-erythematous Neck:   Nontender with no meningismus, no masses, no stridor Cardiovascular: Normal rate, regular rhythm. Grossly normal heart sounds.  Good peripheral circulation. Respiratory: Normal respiratory effort.  No retractions. Lungs CTAB. Abdominal: Soft and nontender. No distention. No guarding no rebound Back:  There is no focal tenderness or step off.  there is no midline tenderness there are no lesions noted. there is no CVA tenderness Musculoskeletal: No lower extremity tenderness, no  upper extremity tenderness. No joint effusions, no DVT signs strong distal pulses no edema Neurologic:  Normal speech and language.  Pupil is dilated compared to the right but it  is responsive, there is no evidence of ocular injury or pathology, there is otherwise normal cranial nerves, normal sensation normal speech, normal cerebellar function, no evidence of neurologic deficit strength is symmetric etc. Skin:  Skin is warm, dry and intact. No rash noted. Psychiatric: Mood and affect are normal. Speech and behavior are normal.  ____________________________________________   LABS (all labs ordered are listed, but only abnormal results are displayed)  Labs Reviewed  BASIC METABOLIC PANEL  CBC WITH DIFFERENTIAL/PLATELET  PROTIME-INR    Pertinent labs  results that were available during my care of the patient were reviewed by me and considered in my medical decision making (see chart for details). ____________________________________________  EKG  I personally interpreted any EKGs ordered by me or triage Sinus rhythm rate 91 bpm no acute ST elevation or depression normal axis unremarkable EKG ____________________________________________  RADIOLOGY  Pertinent labs & imaging results that were available during my care of the patient were reviewed by me and considered in my medical decision making (see chart for details). If possible, patient and/or family made aware of any abnormal findings.  No results found. ____________________________________________    PROCEDURES  Procedure(s) performed: None  Procedures  Critical Care performed: None  ____________________________________________   INITIAL IMPRESSION / ASSESSMENT AND PLAN / ED COURSE  Pertinent labs & imaging results that were available during my care of the patient were reviewed by me and considered in my medical decision making (see chart for details).  Here with a slight headache and pupillary dilatation no evidence  of acute ocular pathology, concern exists for comp gated migraine, P-comm, or other pathology we will obtain CT head and CT angios.  She is neurologically intact with no evidence of CVA  ----------------------------------------- 9:00 AM on 11/29/2017 -----------------------------------------   d/w Dr. Doy Mince, who agrees with CT CTA, she feels this could likely also be secondary to the migraine process.  ----------------------------------------- 11:32 AM on 11/29/2017 -----------------------------------------  Discussed with Dr. Doy Mince, she does not feel that further neurologic work-up is indicated at this time she does not feel MRI is indicated.  She feels this is most likely secondary to patient's chronic migrainous symptoms, patient has no eye pain in her visual acuity is 20/20 bilaterally, I will discuss with ophthalmology however.  ----------------------------------------- 11:35 AM on 11/29/2017 -----------------------------------------  Discussed with Dr. Edison Pace of ophthalmology, he and I discussed all the patient's findings and exam.  He feels that the patient likely is having a migraine associated pupillary dysfunction, he does not feel any further imaging is indicated, and he does not feel inpatient care as needed.  He feels that the patient is safe for close follow-up on Friday with his office, there is a day after tomorrow.  He has no recognition for drops does not feel that we need to check pressures or anything else that I given the symptomology that we are finding and we have discussed this with patient and family they are very comfortable with this and they will follow closely.  Extensive return precautions given and understood for any visual changes, any worsening headache any numbness any weakness etc.  Discharge, patient remains completely neurologically intact with aside from a sluggish left pupil.  Is no eye pain or eye tenderness, and EOMI and no other evidence of [place]  syndrome      ____________________________________________   FINAL CLINICAL IMPRESSION(S) / ED DIAGNOSES  Final diagnoses:  None      This chart was dictated using voice recognition software.  Despite best efforts  to proofread,  errors can occur which can change meaning.      Schuyler Amor, MD 11/29/17 [id number]    Schuyler Amor, MD 11/29/17 1136    Schuyler Amor, MD 11/29/17 1157    Schuyler Amor, MD 11/29/17 [phone]

## 2017-11-29 NOTE — ED Notes (Signed)
First Nurse Note:  Patient arrives from [facility] via [facility] complaining of "pressure" in her head.  Obviously dilated left pupil, patient states this is new.  Alert and oriented.  Hx of migraines.

## 2017-11-29 NOTE — Discharge Instructions (Signed)
Return to the emergency room for any new or worrisome symptoms including numbness, weakness, increased pain in your head, difficulty talking, vision changes, difficulty walking, any concerns that are new or different or worsening should bring back to the emergency room.  Follow-up closely with your neurologist as well as the ophthalmologist on Friday.

## 2017-11-29 NOTE — ED Notes (Signed)
Pt ambulatory without difficulty to POV. VSS. NAD. Discharge instructions and follow up reviewed with pt.

## 2017-11-29 NOTE — ED Triage Notes (Signed)
Pt to ED via POV c/o pressure in head, elevated blood pressure and dilated left pupil. Pt states that she noticed pupil being dilated about 1 hour PTA. Pt states that she has hx/o HTN but it is normally controlled. Pt states that she has pressure all over her head. Pt states that she had a migraine yesterday. Pt in NAD at this time.

## 2017-11-29 NOTE — ED Notes (Signed)
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 Pt is a/ox4. States she has pain in that left eye on arrival. Denies any visual changes. Denies any numbness or weakness. Pt ambulatory without difficulty.Marland Kitchen

## 2018-07-28 ENCOUNTER — Other Ambulatory Visit: Payer: Self-pay

## 2018-07-28 ENCOUNTER — Emergency Department: Payer: BC Managed Care – PPO

## 2018-07-28 ENCOUNTER — Emergency Department
Admission: EM | Admit: 2018-07-28 | Discharge: 2018-07-28 | Discharge status: Against Medical Advice | Payer: BC Managed Care – PPO | Attending: Emergency Medicine | Admitting: Emergency Medicine

## 2018-07-28 ENCOUNTER — Encounter: Payer: Self-pay | Admitting: Emergency Medicine

## 2018-07-28 DIAGNOSIS — R0602 Shortness of breath: Secondary | ICD-10-CM | POA: Diagnosis present

## 2018-07-28 DIAGNOSIS — Z5321 Procedure and treatment not carried out due to patient leaving prior to being seen by health care provider: Secondary | ICD-10-CM | POA: Diagnosis not present

## 2018-07-28 LAB — COMPREHENSIVE METABOLIC PANEL
ALT: 66 U/L — ABNORMAL HIGH (ref 0–44)
AST: 57 U/L — ABNORMAL HIGH (ref 15–41)
Albumin: 4.5 g/dL (ref 3.5–5.0)
Alkaline Phosphatase: 92 U/L (ref 38–126)
Anion gap: 10 (ref 5–15)
BUN: 18 mg/dL (ref 6–20)
CHLORIDE: 102 mmol/L (ref 98–111)
CO2: 24 mmol/L (ref 22–32)
Calcium: 9.5 mg/dL (ref 8.9–10.3)
Creatinine, Ser: 0.76 mg/dL (ref 0.44–1.00)
GFR calc Af Amer: >60 mL/min (ref >60)
GFR calc non Af Amer: >60 mL/min (ref >60)
Glucose, Bld: 189 mg/dL — ABNORMAL HIGH (ref 70–99)
POTASSIUM: 4.3 mmol/L (ref 3.5–5.1)
Sodium: 136 mmol/L (ref 135–145)
Total Bilirubin: 0.5 mg/dL (ref 0.3–1.2)
Total Protein: 7.4 g/dL (ref 6.5–8.1)

## 2018-07-28 LAB — TROPONIN I: Troponin I: <0.03 ng/mL (ref <0.03)

## 2018-07-28 LAB — CBC WITH DIFFERENTIAL/PLATELET
Abs Immature Granulocytes: 0.03 10*3/uL (ref 0.00–0.07)
Basophils Absolute: 0.1 10*3/uL (ref 0.0–0.1)
Basophils Relative: 1 %
Eosinophils Absolute: 0.3 10*3/uL (ref 0.0–0.5)
Eosinophils Relative: 3 %
HCT: 46.9 % — ABNORMAL HIGH (ref 36.0–46.0)
HEMOGLOBIN: 14.7 g/dL (ref 12.0–15.0)
Immature Granulocytes: 0 %
Lymphocytes Relative: 33 %
Lymphs Abs: 2.8 10*3/uL (ref 0.7–4.0)
MCH: 25.5 pg — ABNORMAL LOW (ref 26.0–34.0)
MCHC: 31.3 g/dL (ref 30.0–36.0)
MCV: 81.3 fL (ref 80.0–100.0)
MONOS PCT: 9 %
Monocytes Absolute: 0.8 10*3/uL (ref 0.1–1.0)
Neutro Abs: 4.5 10*3/uL (ref 1.7–7.7)
Neutrophils Relative %: 54 %
PLATELETS: 219 10*3/uL (ref 150–400)
RBC: 5.77 MIL/uL — ABNORMAL HIGH (ref 3.87–5.11)
RDW: 14.9 % (ref 11.5–15.5)
WBC: 8.5 10*3/uL (ref 4.0–10.5)
nRBC: 0 % (ref 0.0–0.2)

## 2018-07-28 NOTE — ED Notes (Signed)
Second call, not in [place].

## 2018-07-28 NOTE — ED Notes (Signed)
Third call for room, not in waiting room.

## 2018-07-28 NOTE — ED Triage Notes (Signed)
Pt arrives with complaints of shortness of breath that started today. Pt states her chest has "felt funny." Pt states she feels like she cannot catch her breath. Lungs clear on auscultation. Pt denies pain. While in triage pt states the shortness of breath episode has stopped.

## 2018-07-28 NOTE — ED Notes (Signed)
Called for room, not in waiting room.

## 2020-02-02 IMAGING — MR MR LUMBAR SPINE W/O CM
4 of 5 series · 15 of 48 positions shown · non-contrast
Comparison: CT abdomen pelvis 12/27/2016

CLINICAL DATA: Low back pain radiating to the left leg for 5 weeks

EXAM:
MRI LUMBAR SPINE WITHOUT CONTRAST
TECHNIQUE: Multiplanar, multisequence MR imaging of the lumbar spine was
performed. No intravenous contrast was administered.

[Series 2: T2 · sagittal · 4.0mm · 0.44mm/px · 6 of 15 slices shown (1 of 2)]
[im 1/15]
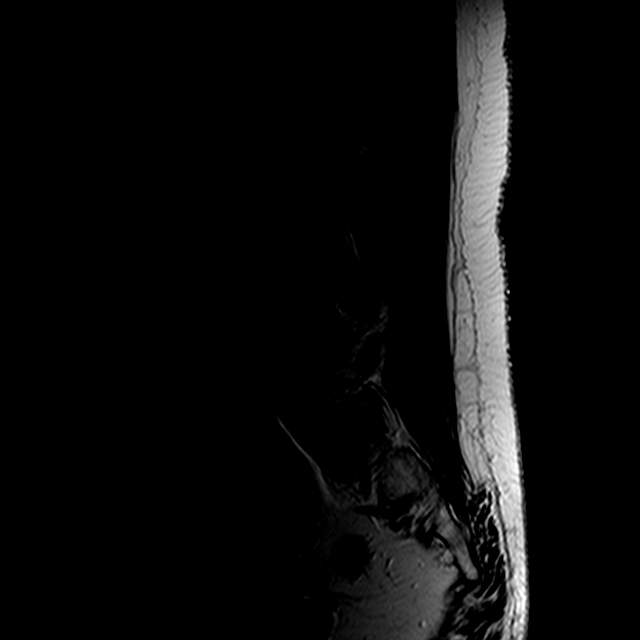
[im 3/15]
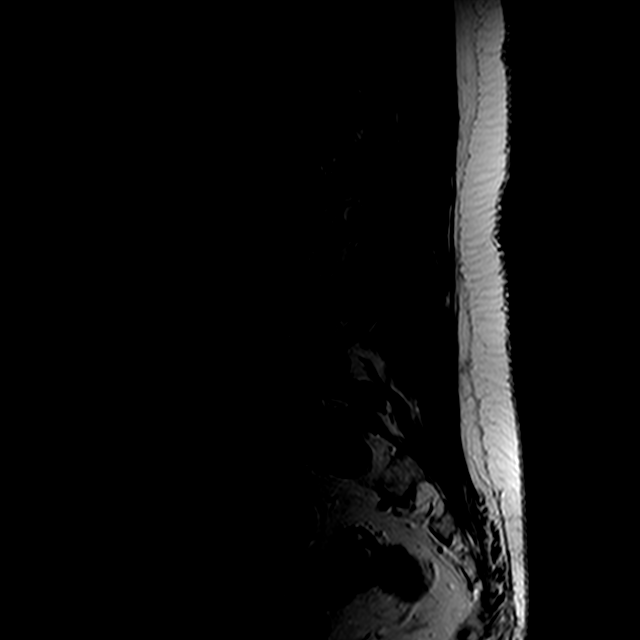
[im 6/15]
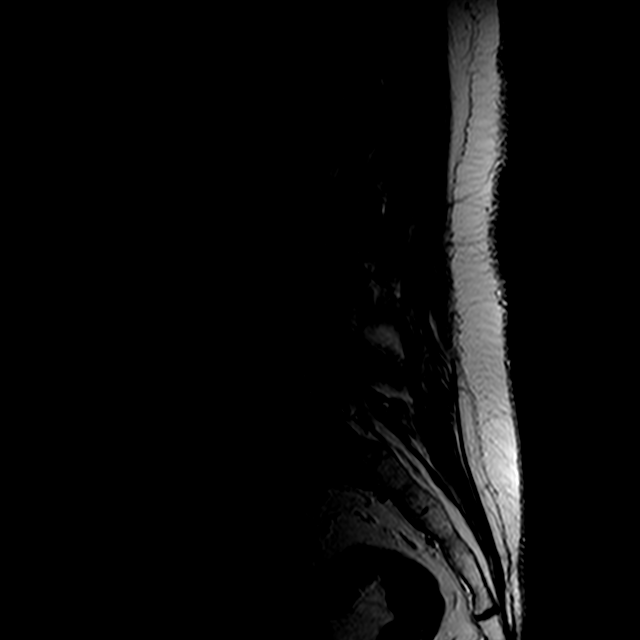
[im 9/15]
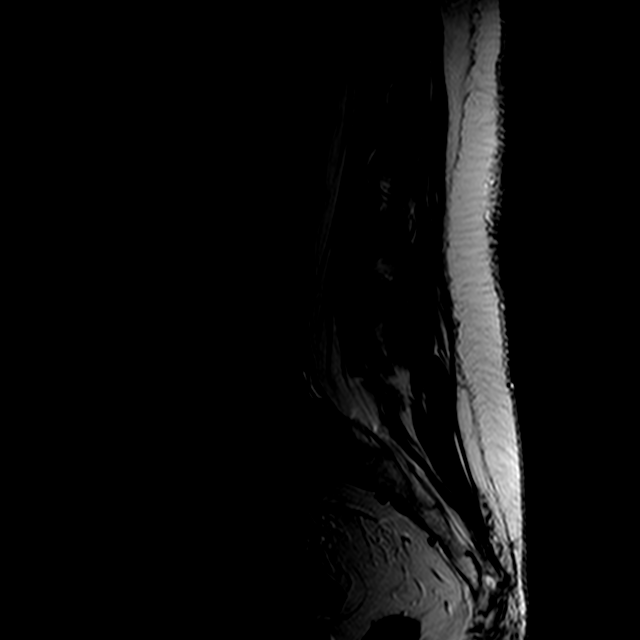
[im 12/15]
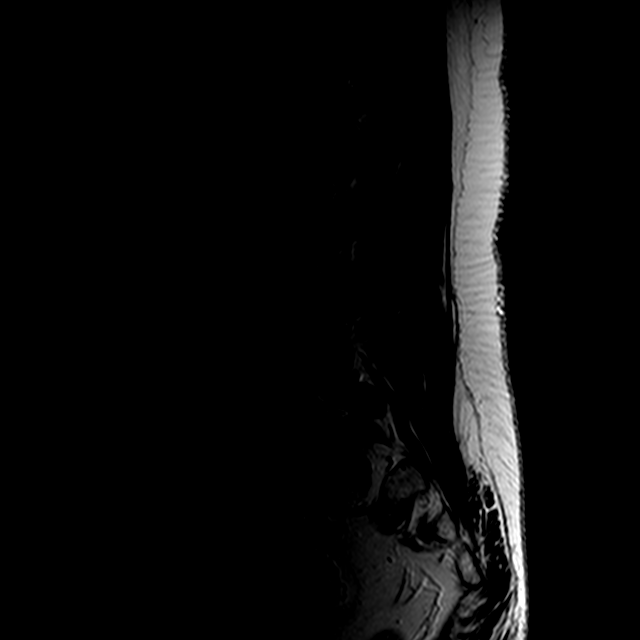
[im 15/15]
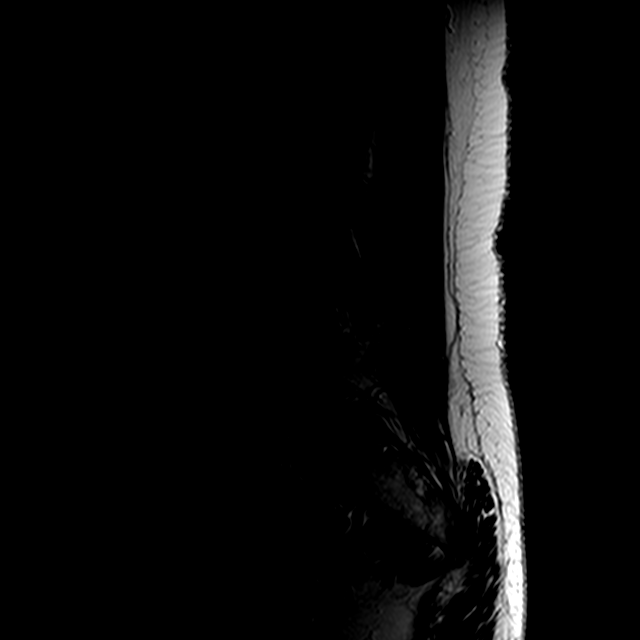

[Series 3: T1 · sagittal · 4.0mm · 0.44mm/px · 3 of 15 slices shown (1 of 2)]
[im 3/15]
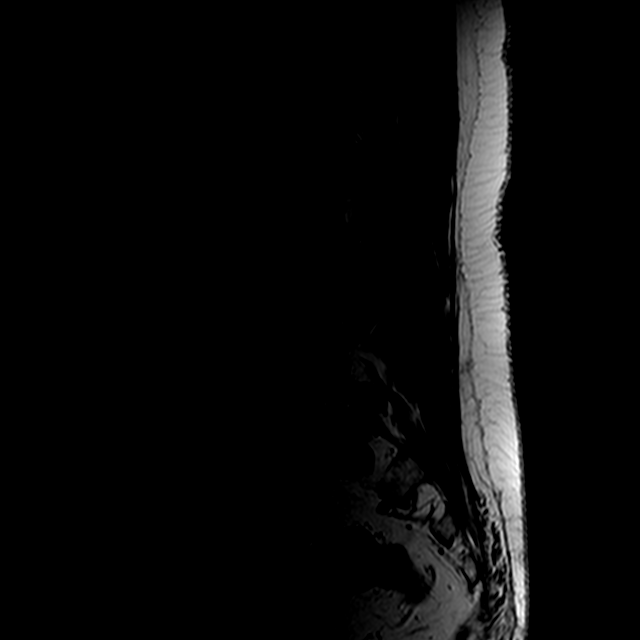
[im 9/15]
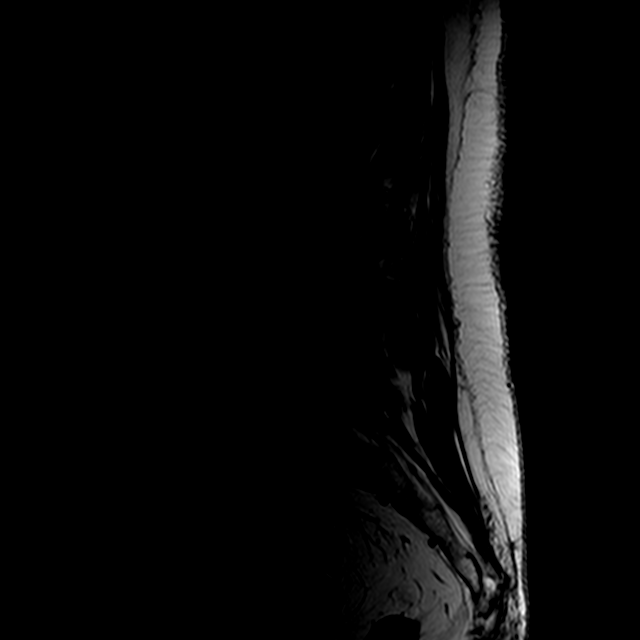
[im 15/15]
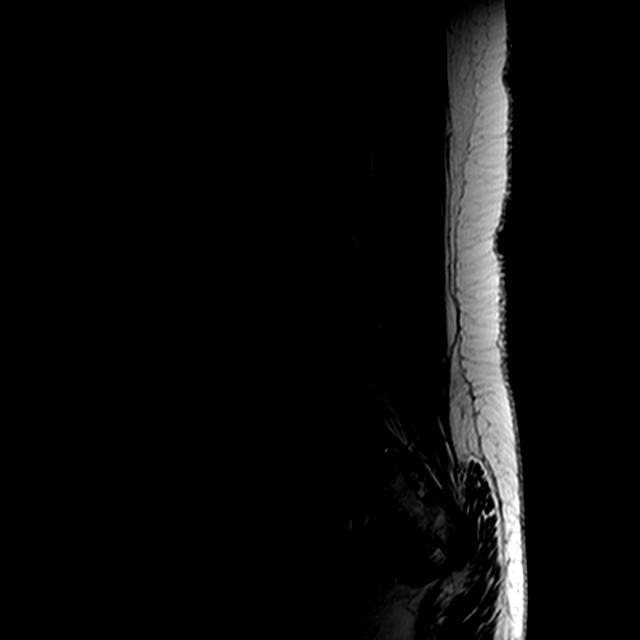

[Series 5: T2 · axial · 4.0mm · 0.39mm/px · z∈[-32,+125]mm · 3 of 38 slices shown (2 of 2)]
[im 6/38]
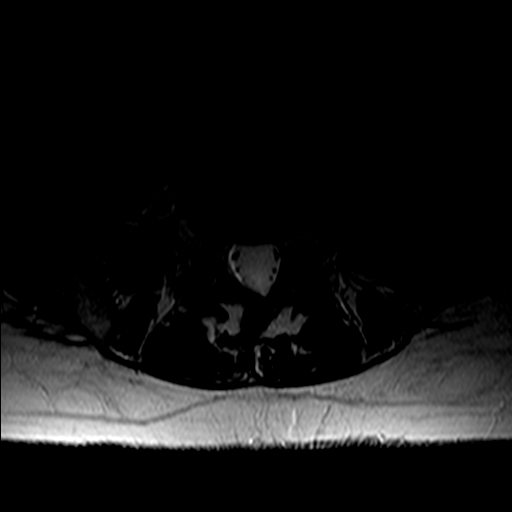
[im 19/38]
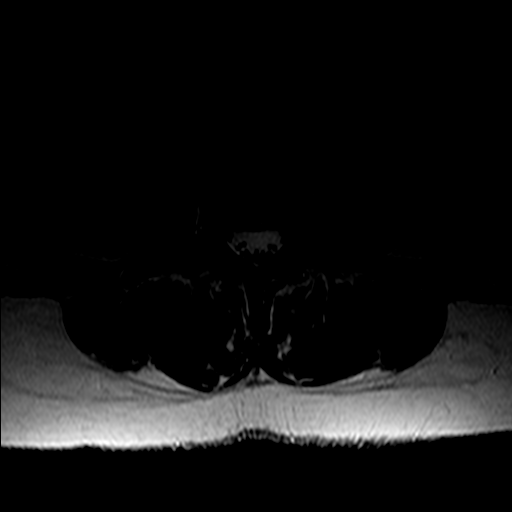
[im 32/38]
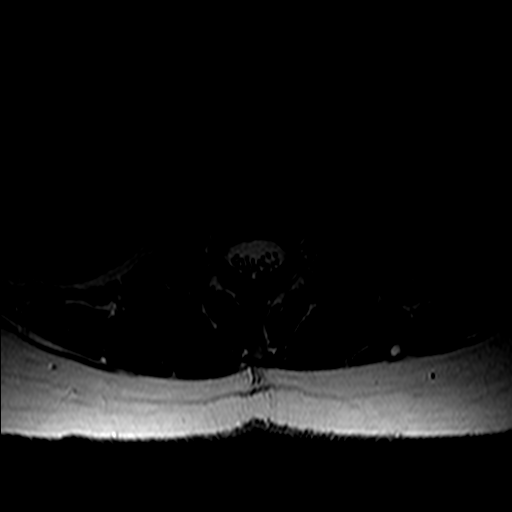

[Series 6: T1 · axial · 4.0mm · 0.39mm/px · z∈[-32,+125]mm · 3 of 38 slices shown (2 of 2)]
[im 6/38]
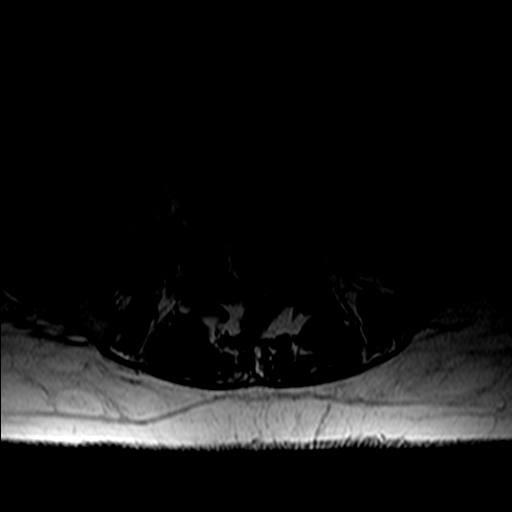
[im 19/38]
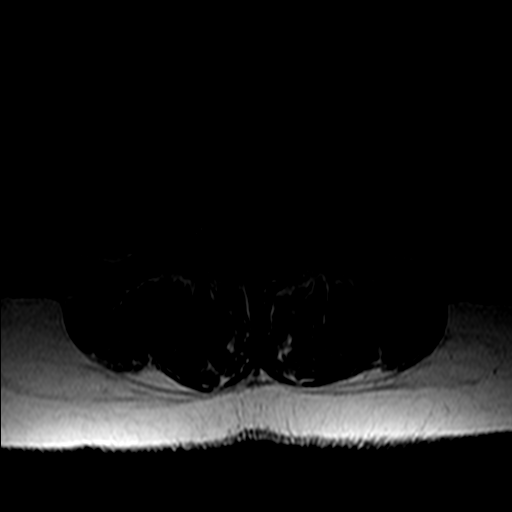
[im 32/38]
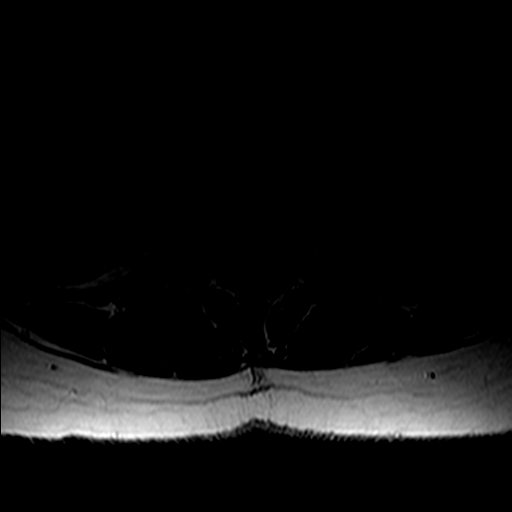

[15 of 48 positions shown; findings below may reference images not displayed]

FINDINGS: Segmentation:  Standard.

Alignment: Grade 1 L4-5 retrolisthesis and grade 1 L5-S1
anterolisthesis. There are bilateral pars interarticularis defects
at L5.

Vertebrae: No acute compression fracture, discitis-osteomyelitis,
facet edema or other focal marrow lesion. No epidural collection.

Conus medullaris and cauda equina: Conus extends to the L1 level.
Conus and cauda equina appear normal.

Paraspinal and other soft tissues: The visualized vascular,
retroperitoneal and paraspinal structures are normal.

Disc levels:

T12-L1: Normal disc space and facets. No spinal canal or
neuroforaminal stenosis.

L1-L2: Partially calcified disc fragment in the right subarticular
zone. No stenosis.

L2-L3: Normal disc space and facets. No spinal canal or
neuroforaminal stenosis.

L3-L4: Disc desiccation with mild bulge.  No stenosis.

L4-L5: Disc desiccation with small central disc protrusion. No
stenosis.

L5-S1: Grade 1 anterolisthesis with pseudo disc bulge. No spinal
canal stenosis or neural foraminal stenosis.

Visualized sacrum: Normal.
IMPRESSION: 1. Grade 1 L4-5 retrolisthesis and grade 1 L5-S1 anterolisthesis
secondary to bilateral pars interarticularis defects at L5.
2. No spinal canal or neural foraminal stenosis.

## 2020-04-12 NOTE — Progress Notes (Signed)
Cardiology Office Note  Date:  04/13/2020   ID:  ETOLA MULL, DOB 01-09-1961, MRN [id number]  PCP:  Marinda Elk, MD   Chief Complaint  Patient presents with  . OTHER    Est. Care c/o palpitations. Meds reviewed verbally with pt.    HPI:  Ms. Sarah Chaney is a 59 year old woman with a history of  hypertension,  hyperlipidemia,  smoking hx NASH DM II GERD Who presents for new patient evaluation for palpitations  Previously seen by Dr. Ubaldo Glassing for insurance issue Insurance has changed, now would like to follow-up with our office Continues to have lots of palpitations In AM is racing, Takes metoprolol succinate 50 mg daily in the morning (Some confusion whether she is on the tartrate or succinate).  Notes from [facility] shows succinate  She does have tartrate 25 mg daily for breakthrough  Sees endocrine for management of her diabetes Started on new medication, A1c trending downward Long working days, works at [employer]  EKG personally reviewed by myself on todays visit Normal sinus rhythm rate 88 bpm no significant ST or T wave changes  PMH:   has a past medical history of Adenomatous colon polyp (1997), Anemia, unspecified, Celiac disease, Chondromalacia of patella, Colon polyps, Complication of anesthesia, Diabetes mellitus ([place]), Disorders of bursae and tendons in shoulder region, unspecified, Diverticulosis, Elevated anti-tissue transglutaminase (tTG) IgA level, Gastric polyps, GERD (gastroesophageal reflux disease), Hashimoto's disease, Headache(784.0), Irritable bowel syndrome, Lateral epicondylitis  of elbow, Nausea, Neuralgia, neuritis, and radiculitis, unspecified, Obesity, unspecified, Palpitations, Persistent disorder of initiating or maintaining sleep, Personal history of tobacco use, presenting hazards to health, PONV (postoperative nausea and vomiting), Pure hypercholesterolemia, Restless legs syndrome (RLS), Seizures ([place]), Tachycardia, unspecified, 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  PSH:    Past Surgical History:  Procedure Laterality Date  . ABD [place]  12/2002   Fatty liver  . ABD [place]  05/2010   Fatty liver, normal gall bladder and pancreas  . ABDOMINOPLASTY    . BREAST BIOPSY Right 1992   excisional negative biopsy  . CESAREAN SECTION     x2  . COLONOSCOPY  12/2001   Negative  . ESOPHAGOGASTRODUODENOSCOPY  12/2001  . ESOPHAGOGASTRODUODENOSCOPY  11/2007   nml  . ESOPHAGOGASTRODUODENOSCOPY N/A 12/16/2015   Procedure: ESOPHAGOGASTRODUODENOSCOPY (EGD);  Surgeon: Hulen Luster, MD;  Location: [facility];  Service: Gastroenterology;  Laterality: N/A;  DIABETIC-ORAL MED  . exercise treadmill  5/13   normal  . KNEE BURSECTOMY Left 07/11/2017   Procedure: HIP OPEN TROCHANTERIC  BURSECTOMY  with IT Band realse.;  Surgeon: Leim Fabry, MD;  Location: [facility];  Service: Orthopedics;  Laterality: Left;  Diabetic - insulin and oral meds  . POLYPECTOMY  12/2001  . POPLITEAL SYNOVIAL CYST EXCISION  12/2003  . REDUCTION MAMMAPLASTY Bilateral 1991  . TONSILLECTOMY    . TOOTH EXTRACTION  1978   root canal x3    Current Outpatient Medications  Medication Sig Dispense Refill  . aspirin 81 MG EC tablet Take by mouth daily.    Marland Kitchen BAYER MICROLET LANCETS lancets Use to test blood sugar 4 times daily as instructed. 200 each 10  . Biotin 10 MG TABS Take 1 tablet by mouth daily. AM    . Blood Glucose Monitoring Suppl (BAYER CONTOUR NEXT MONITOR) W/DEVICE KIT Use to test blood  sugar 4 times daily. 1 kit 0  . empagliflozin (JARDIANCE) 25 MG TABS tablet daily.    . ergocalciferol (VITAMIN D2) 1.25 MG (50000 UT) capsule Take by mouth once a week.    . ferrous sulfate 325 (65 FE) MG tablet Take by  mouth daily.    . frovatriptan (FROVA) 2.5 MG tablet Take by mouth as needed.    Marland Kitchen glucose blood (BAYER CONTOUR NEXT TEST) test strip Use to test blood sugar 4 times daily as instructed. 125 each 11  . Insulin Degludec (TRESIBA FLEXTOUCH [place]) Inject 52 Units into the skin daily.    Marland Kitchen levothyroxine (SYNTHROID, LEVOTHROID) 125 MCG tablet TAKE 1 TABLET BY MOUTH DAILY BEFORE BREAKFAST. (Patient taking differently: TAKE 1 TABLET BY MOUTH DAILY BEFORE BREAKFAST. - takes 137 mcg) 90 tablet 0  . losartan (COZAAR) 50 MG tablet Take 1 tablet (50 mg total) by mouth daily.    . meloxicam (MOBIC) 15 MG tablet Take 15 mg by mouth daily.    . metFORMIN (GLUCOPHAGE XR) 500 MG 24 hr tablet Take 1 tablet (500 mg total) by mouth daily with breakfast. (Patient taking differently: Take 1,000 mg by mouth 2 (two) times daily. AM) 60 tablet 1  . metoprolol tartrate (LOPRESSOR) 50 MG tablet Take 1 tablet (50 mg total) by mouth 2 (two) times daily. 180 tablet 3  . nortriptyline (PAMELOR) 10 MG capsule Take 20 mg by mouth daily.    Marland Kitchen omeprazole (PRILOSEC) 40 MG capsule Take 1 capsule (40 mg total) by mouth 2 (two) times daily. **needs to schedule appt with doctor before future refills are given 60 capsule 0  . rizatriptan (MAXALT) 10 MG tablet Take 10 mg by mouth as needed for migraine. May repeat in 2 hours if needed    . rosuvastatin (CRESTOR) 5 MG tablet Take 5 mg by mouth daily.    Marland Kitchen zolpidem (AMBIEN) 10 MG tablet TAKE ONE TABLET BY MOUTH AT BEDTIME AS NEEDED FOR SLEEP 30 tablet 3  . metoprolol succinate (TOPROL-XL) 100 MG 24 hr tablet Take 1 tablet (100 mg total) by mouth daily. Take with or immediately following a meal. 90 tablet 3   No current facility-administered medications for this visit.     Allergies:   Carbamazepine, Cycloset [bromocriptine], Diltiazem hcl, Lisinopril, Metformin and related, Nifedipine, and Verapamil   Social History:  The patient  reports that she quit smoking about 8 years ago. Her smoking  use included cigarettes. She has a 9.60 pack-year smoking history. She has never used smokeless tobacco. She reports that she does not drink alcohol and does not use drugs.   Family History:   family history includes Breast cancer (age of onset: 60) in her maternal aunt; Diabetes in her mother and another family member; Diabetes type II in her father and mother; Hyperlipidemia in her mother; Thyroid disease in her mother.    Review of Systems: Review of Systems  Constitutional: Negative.   HENT: Negative.   Respiratory: Negative.   Cardiovascular: Positive for palpitations.  Gastrointestinal: Negative.   Musculoskeletal: Negative.   Neurological: Negative.   Psychiatric/Behavioral: Negative.   All other systems reviewed and are negative.    PHYSICAL EXAM: VS:  BP (!) 142/102 (BP Location: Right Arm, Patient Position: Sitting, Cuff Size: Normal)   Pulse 87   Ht _0  (1.727 m)   Wt 213 lb (96.6 kg)   SpO2 98%   BMI 32.39 kg/m  , BMI Body mass index is 32.39 kg/m. GEN: Well nourished,  well developed, in no acute distress HEENT: normal Neck: no JVD, carotid bruits, or masses Cardiac: RRR; no murmurs, rubs, or gallops,no edema  Respiratory:  clear to auscultation bilaterally, normal work of breathing GI: soft, nontender, nondistended, + BS MS: no deformity or atrophy Skin: warm and dry, no rash Neuro:  Strength and sensation are intact Psych: euthymic mood, full affect   Recent Labs: No results found for requested labs within last 8760 hours.    Lipid Panel Lab Results  Component Value Date   CHOL 216 (H) 05/13/2015   HDL 51.60 05/13/2015   LDLCALC 141 (H) 05/13/2015   TRIG 121.0 05/13/2015      Wt Readings from Last 3 Encounters:  04/13/20 213 lb (96.6 kg)  07/28/18 218 lb (98.9 kg)  11/29/17 220 lb (99.8 kg)       ASSESSMENT AND PLAN:  Problem List Items Addressed This Visit      Cardiology Problems   PURE HYPERCHOLESTEROLEMIA   Relevant Medications    aspirin 81 MG EC tablet   losartan (COZAAR) 50 MG tablet   metoprolol tartrate (LOPRESSOR) 50 MG tablet   metoprolol succinate (TOPROL-XL) 100 MG 24 hr tablet   Other Relevant Orders   EKG 12-Lead   Essential hypertension - Primary   Relevant Medications   aspirin 81 MG EC tablet   losartan (COZAAR) 50 MG tablet   metoprolol tartrate (LOPRESSOR) 50 MG tablet   metoprolol succinate (TOPROL-XL) 100 MG 24 hr tablet   Other Relevant Orders   EKG 12-Lead    Other Visit Diagnoses    Controlled type 2 diabetes mellitus without complication, without long-term current use of insulin (HCC)       Relevant Medications   empagliflozin (JARDIANCE) 25 MG TABS tablet   aspirin 81 MG EC tablet   losartan (COZAAR) 50 MG tablet     Paroxysmal tachycardia Worse in the morning, likely when metoprolol succinate wears off Recommend we increase metoprolol succinate up to 100 mg daily, take at nighttime She is having some fatigue, unclear if this is from the metoprolol in the morning Discussed metoprolol tartrate 25 up to 50 mg twice daily as needed for breakthrough palpitations -We did discuss other beta-blockers that might be available such as bystolic, this may have less of the fatigue side effect).  Type 2 diabetes Managed by endocrine, great improvement in A1c We would recommend strict diet, walking program  Hyperlipidemia On Crestor 5 daily In general total cholesterol 150 up to 170 Discussed CT coronary calcium scoring for risk stratification if she would like Suggested she call us if she would like to schedule  Disposition:   F/U  12 months   Total encounter time more than 45 minutes  Greater than 50% was spent in counseling and coordination of care with the patient    Signed, Esmond Plants, M.D., Ph.D. [place], [place]

## 2020-04-13 ENCOUNTER — Ambulatory Visit: Payer: BC Managed Care – PPO | Admitting: Cardiovascular Disease

## 2020-04-13 ENCOUNTER — Telehealth: Payer: Self-pay | Admitting: Cardiovascular Disease

## 2020-04-13 ENCOUNTER — Encounter: Payer: Self-pay | Admitting: Cardiovascular Disease

## 2020-04-13 ENCOUNTER — Other Ambulatory Visit: Payer: Self-pay

## 2020-04-13 VITALS — BP 142/102 | HR 87 | Ht 68.0 in | Wt 213.0 lb

## 2020-04-13 DIAGNOSIS — E7800 Pure hypercholesterolemia, unspecified: Secondary | ICD-10-CM | POA: Diagnosis not present

## 2020-04-13 DIAGNOSIS — E119 Type 2 diabetes mellitus without complications: Secondary | ICD-10-CM

## 2020-04-13 DIAGNOSIS — I10 Essential (primary) hypertension: Secondary | ICD-10-CM

## 2020-04-13 MED ORDER — METOPROLOL TARTRATE 50 MG PO TABS: 50.0000 mg | ORAL_TABLET | Freq: Two times a day (BID) | ORAL | 3 refills | Status: DC

## 2020-04-13 MED ORDER — METOPROLOL SUCCINATE ER 100 MG PO TB24: 100.0000 mg | ORAL_TABLET | Freq: Every day | ORAL | 3 refills | Status: DC

## 2020-04-13 NOTE — Telephone Encounter (Signed)
Please call regarding Metoprolol. The pharmacist has 2 that he is inquiring about dispensing.

## 2020-04-13 NOTE — Patient Instructions (Signed)
Medication Instructions:  Please increase metoprolol succinate up to 100 mg daily Take metoprolol tartrate 25 to 50 mg as needed up to twice a day  If you need a refill on your cardiac medications before your next appointment, please call your pharmacy.    Lab work: No new labs needed   If you have labs (blood work) drawn today and your tests are completely normal, you will receive your results only by: Marland Kitchen MyChart Message (if you have MyChart) OR . A paper copy in the mail If you have any lab test that is abnormal or we need to change your treatment, we will call you to review the results.   Testing/Procedures: No new testing needed   Follow-Up: At [facility], you and your health needs are our priority.  As part of our continuing mission to provide you with exceptional heart care, we have created designated Provider Care Teams.  These Care Teams include your primary Cardiologist (physician) and Advanced Practice Providers (APPs -  Physician Assistants and Nurse Practitioners) who all work together to provide you with the care you need, when you need it.  . You will need a follow up appointment in 12 months  . Providers on your designated Care Team:   . Murray Hodgkins, NP . Christell Faith, PA-C . Marrianne Mood, PA-C  Any Other Special Instructions Will Be Listed Below (If Applicable).  COVID-19 Vaccine Information can be found at: ShippingScam.co.uk For questions related to vaccine distribution or appointments, please email [email or web address] or call [phone].

## 2020-04-13 NOTE — Telephone Encounter (Signed)
Late entry note: Spoke with Sarah Chaney at pharmacy there to clarify both medications and dosages. We had spoke earlier after patient left to clarify due to being on both medications and clarifications of both dosages. He read back all instructions and had no further questions at this time.

## 2020-12-29 ENCOUNTER — Telehealth: Payer: Self-pay | Admitting: Cardiovascular Disease

## 2020-12-29 NOTE — Telephone Encounter (Signed)
Will route to the [facility] to make an appointment for the pt.

## 2020-12-29 NOTE — Telephone Encounter (Signed)
   Name: Sarah Chaney  DOB: [birth year]  MRN: [id number]  Primary Cardiologist: None  Chart reviewed as part of pre-operative protocol coverage. Because of THEREASA IANNELLO the time since last visit, she will require a follow-up visit in order to better assess preoperative cardiovascular risk.  Pre-op covering staff: - Please schedule appointment and call patient to inform them. If patient already had an upcoming appointment within acceptable timeframe, please add "pre-op clearance" to the appointment notes so provider is aware. - Please contact requesting surgeon's office via preferred method (i.e, phone, fax) to inform them of need for appointment prior to surgery.  If applicable, this message will also be routed to pharmacy pool and/or primary cardiologist for input on holding anticoagulant/antiplatelet agent as requested below so that this information is available to the clearing provider at time of patient's appointment.   Kathyrn Drown, NP  12/29/2020, 3:42 PM

## 2020-12-29 NOTE — Telephone Encounter (Signed)
   [facility] Pre-operative Risk Assessment    Patient Name: Sarah Chaney  DOB: 16-Sep-1960  MRN: [id number]   HEARTCARE STAFF: - Please ensure there is not already an duplicate clearance open for this procedure. - Under Visit Info/Reason for Call, type in Other and utilize the format Clearance MM/DD/YY or Clearance TBD. Do not use dashes or single digits. - If request is for dental extraction, please clarify the # of teeth to be extracted. - If the patient is currently at the dentist's office, call Pre-Op APP to address. If the patient is not currently in the dentist office, please route to the Pre-Op pool  Request for surgical clearance:  1. What type of surgery is being performed? Bariatric surgery  2. When is this surgery scheduled? TBD  3. What type of clearance is required (medical clearance vs. Pharmacy clearance to hold med vs. Both)? both  4. Are there any medications that need to be held prior to surgery and how long? Not listed, please advise if needed  5. Practice name and name of physician performing surgery? [facility]   6. What is the office phone number? [phone]   7.   What is the office fax number? [phone]  8.   Anesthesia type (None, local, MAC, general) ? Not listed   Ace Gins 12/29/2020, 3:20 PM  _________________________________________________________________   (provider comments below)

## 2020-12-29 NOTE — Telephone Encounter (Signed)
Pt is scheduled to see Dr. Rockey Situ, 01/20/2021, and clearance will be addressed at that appointment. Will route back to the requesting surgeon's office to make them aware.

## 2021-01-20 ENCOUNTER — Ambulatory Visit: Payer: BC Managed Care – PPO | Admitting: Physician Assistant

## 2021-04-07 ENCOUNTER — Other Ambulatory Visit: Payer: Self-pay | Admitting: Cardiovascular Disease

## 2021-04-07 NOTE — Telephone Encounter (Signed)
Please contact pt for future appointment. Pt due for 12 month f/u. 

## 2021-04-08 NOTE — Telephone Encounter (Signed)
LVM to schedule appt

## 2021-08-15 ENCOUNTER — Emergency Department
Admission: EM | Admit: 2021-08-15 | Discharge: 2021-08-15 | Disposition: A | Discharge status: Home / Self Care | Payer: BC Managed Care – PPO | Attending: Emergency Medicine | Admitting: Emergency Medicine

## 2021-08-15 ENCOUNTER — Emergency Department: Payer: BC Managed Care – PPO

## 2021-08-15 ENCOUNTER — Other Ambulatory Visit: Payer: Self-pay

## 2021-08-15 DIAGNOSIS — N200 Calculus of kidney: Secondary | ICD-10-CM | POA: Diagnosis not present

## 2021-08-15 DIAGNOSIS — R109 Unspecified abdominal pain: Secondary | ICD-10-CM | POA: Diagnosis present

## 2021-08-15 LAB — CBC
HCT: 38.8 % (ref 36.0–46.0)
Hemoglobin: 13.4 g/dL (ref 12.0–15.0)
MCH: 29.5 pg (ref 26.0–34.0)
MCHC: 34.5 g/dL (ref 30.0–36.0)
MCV: 85.5 fL (ref 80.0–100.0)
Platelets: 195 10*3/uL (ref 150–400)
RBC: 4.54 MIL/uL (ref 3.87–5.11)
RDW: 12.5 % (ref 11.5–15.5)
WBC: 8.4 10*3/uL (ref 4.0–10.5)
nRBC: 0 % (ref 0.0–0.2)

## 2021-08-15 LAB — URINALYSIS, ROUTINE W REFLEX MICROSCOPIC
Bacteria, UA: NONE SEEN
Bilirubin Urine: NEGATIVE
Glucose, UA: NEGATIVE mg/dL
Ketones, ur: NEGATIVE mg/dL
Leukocytes,Ua: NEGATIVE
Nitrite: NEGATIVE
Protein, ur: NEGATIVE mg/dL
RBC / HPF: >50 RBC/hpf — ABNORMAL HIGH (ref 0–5)
Specific Gravity, Urine: 1.011 (ref 1.005–1.030)
WBC, UA: NONE SEEN WBC/hpf (ref 0–5)
pH: 7 (ref 5.0–8.0)

## 2021-08-15 LAB — BASIC METABOLIC PANEL
Anion gap: 8 (ref 5–15)
BUN: 14 mg/dL (ref 6–20)
CO2: 24 mmol/L (ref 22–32)
Calcium: 9.6 mg/dL (ref 8.9–10.3)
Chloride: 104 mmol/L (ref 98–111)
Creatinine, Ser: 0.53 mg/dL (ref 0.44–1.00)
GFR, Estimated: >60 mL/min (ref >60)
Glucose, Bld: 190 mg/dL — ABNORMAL HIGH (ref 70–99)
Potassium: 4.6 mmol/L (ref 3.5–5.1)
Sodium: 136 mmol/L (ref 135–145)

## 2021-08-15 MED ORDER — ONDANSETRON 4 MG PO TBDP
4.0000 mg | ORAL_TABLET | Freq: Once | ORAL | Status: AC
  Administered 2021-08-15: 4 mg via ORAL
  Filled 2021-08-15: qty 1

## 2021-08-15 MED ORDER — OXYCODONE-ACETAMINOPHEN 5-325 MG PO TABS: 1.0000 | ORAL_TABLET | Freq: Four times a day (QID) | ORAL | 0 refills | Status: AC | PRN

## 2021-08-15 MED ORDER — TAMSULOSIN HCL 0.4 MG PO CAPS: 0.4000 mg | ORAL_CAPSULE | Freq: Every day | ORAL | 0 refills | Status: AC

## 2021-08-15 MED ORDER — ONDANSETRON 4 MG PO TBDP: 4.0000 mg | ORAL_TABLET | Freq: Three times a day (TID) | ORAL | 0 refills | Status: AC | PRN

## 2021-08-15 MED ORDER — FENTANYL CITRATE PF 50 MCG/ML IJ SOSY
50.0000 ug | PREFILLED_SYRINGE | Freq: Once | INTRAMUSCULAR | Status: AC
  Administered 2021-08-15: 50 ug via INTRAMUSCULAR
  Filled 2021-08-15: qty 1

## 2021-08-15 NOTE — ED Notes (Signed)
Given urine cup for specimen

## 2021-08-15 NOTE — Discharge Instructions (Signed)
You can take Percocet as needed for pain. Take Zofran with Percocet to avoid nausea. Take Flomax once daily. Increase hydration at home. Please make follow-up appointment with urology.

## 2021-08-15 NOTE — ED Provider Notes (Signed)
[facility] Provider Note  Patient Contact: 11:02 PM (approximate)   History   Flank Pain   HPI  Sarah Chaney is a 61 y.o. female presents to the emergency department with sudden onset of right flank pain that started tonight.  Patient has a history of nephrolithiasis in the past and current symptoms feel similar.  She has had some hematuria at home.  No chest pain, chest tightness or abdominal pain.      Physical Exam   Triage Vital Signs: ED Triage Vitals  Enc Vitals Group     BP 08/15/21 2048 (!) 132/94     Pulse Rate 08/15/21 2048 77     Resp 08/15/21 2048 18     Temp 08/15/21 2048 98 F (36.7 C)     Temp Source 08/15/21 2048 Oral     SpO2 08/15/21 2048 97 %     Weight --      Height --      Head Circumference --      Peak Flow --      Pain Score 08/15/21 2049 10     Pain Loc --      Pain Edu? --      Excl. in [place]? --     Most recent vital signs: Vitals:   08/15/21 2048  BP: (!) 132/94  Pulse: 77  Resp: 18  Temp: 98 F (36.7 C)  SpO2: 97%     General: Alert and in no acute distress. Eyes:  PERRL. EOMI. Head: No acute traumatic findings ENT:      Ears:       Nose: No congestion/rhinnorhea.      Mouth/Throat: Mucous membranes are moist. Neck: No stridor. No cervical spine tenderness to palpation. Cardiovascular:  Good peripheral perfusion Respiratory: Normal respiratory effort without tachypnea or retractions. Lungs CTAB. Good air entry to the bases with no decreased or absent breath sounds. Gastrointestinal: Bowel sounds 4 quadrants. Soft and nontender to palpation. No guarding or rigidity. No palpable masses. No distention. No CVA tenderness. Genitourinary: Patient has right-sided CVA tenderness. Musculoskeletal: Full range of motion to all extremities.  Neurologic:  No gross focal neurologic deficits are appreciated.  Skin:   No rash noted Other:   ED Results / Procedures / Treatments   Labs (all labs ordered are  listed, but only abnormal results are displayed) Labs Reviewed  URINALYSIS, ROUTINE W REFLEX MICROSCOPIC - Abnormal; Notable for the following components:      Result Value   Color, Urine YELLOW (*)    APPearance HAZY (*)    Hgb urine dipstick LARGE (*)    RBC / HPF >50 (*)    All other components within normal limits  BASIC METABOLIC PANEL - Abnormal; Notable for the following components:   Glucose, Bld 190 (*)    All other components within normal limits  CBC  POC URINE PREG, ED        RADIOLOGY  I personally viewed and evaluated these images as part of my medical decision making, as well as reviewing the written report by the radiologist.  ED Provider Interpretation: I personally reviewed CT renal stone study.  Patient has a 2 mm x 3 mm obstructing kidney stone at the ureteropelvic junction.  There is mild right hydronephrosis.      MEDICATIONS ORDERED IN ED: Medications  fentaNYL (SUBLIMAZE) injection 50 mcg (50 mcg Intramuscular Given 08/15/21 2242)  ondansetron (ZOFRAN-ODT) disintegrating tablet 4 mg (4 mg Oral Given 08/15/21  2249)     IMPRESSION / MDM / ASSESSMENT AND PLAN / ED COURSE  I reviewed the triage vital signs and the nursing notes.                              Differential diagnosis includes, but is not limited to, nephrolithiasis, UTI, pyelonephritis, lumbar strain...  Assessment and plan Nephrolithiasis 32-year-old female presents to the emergency department with right-sided flank pain that started acutely tonight.  Vital signs were reassuring at triage.  On physical exam, patient was alert, active and nontoxic-appearing.  Patient had large amount of blood identified on urinalysis with no other concerning findings for UTI.  CBC and BMP were reassuring.  CT renal stone study showed 2 mm x 3 mm renal stone at the ureteropelvic junction.  Patient was given an injection of fentanyl in the emergency department for pain.  She was discharged with  Percocet and Flomax and advised to increase hydration at home.  She was given outpatient referral to urology.  Return precautions were given to return with new or worsening symptoms.      FINAL CLINICAL IMPRESSION(S) / ED DIAGNOSES   Final diagnoses:  Flank pain  Nephrolithiasis     Rx / DC Orders   ED Discharge Orders          Ordered    oxyCODONE-acetaminophen (PERCOCET/ROXICET) 5-325 MG tablet  Every 6 hours PRN        08/15/21 2243    ondansetron (ZOFRAN-ODT) 4 MG disintegrating tablet  Every 8 hours PRN        08/15/21 2243    tamsulosin (FLOMAX) 0.4 MG CAPS capsule  Daily        08/15/21 2243             Note:  This document was prepared using Dragon voice recognition software and may include unintentional dictation errors.   Sarah Mare [place], PA-C 08/15/21 2306    Sarah Bruce, MD 08/25/21 1630

## 2021-08-15 NOTE — ED Triage Notes (Signed)
Pt presents with sudden onset of right flank pain radiating around to right abdomen.  STates urine is orange in color.  Pain just began tonight. Hx of kidney stone about 4 years ago similar.

## 2021-11-04 NOTE — Telephone Encounter (Signed)
Invalid number

## 2021-11-16 NOTE — Telephone Encounter (Signed)
Patient also needs crestor   ?

## 2021-11-16 NOTE — Telephone Encounter (Signed)
Scheduled

## 2021-11-26 ENCOUNTER — Ambulatory Visit: Payer: BC Managed Care – PPO | Admitting: Cardiovascular Disease

## 2021-12-06 ENCOUNTER — Other Ambulatory Visit: Payer: BC Managed Care – PPO

## 2021-12-16 NOTE — Progress Notes (Unsigned)
Office Visit    Patient Name: Sarah Chaney Date of Encounter: 12/17/2021  Primary Care Provider:  Venetia Constable, MD Primary Cardiologist:  Sarah Rogue, MD Primary Electrophysiologist: None  Chief Complaint    Sarah Chaney is a 61 y.o. female with PMH of HTN, NASH, HLD, GERD, T2DM, anemia, celiac disease, Hashimoto's disease, paroxysmal,Tachycardia, nephrolithiases, presents today for 27-monthfollow-up  Past Medical History    Past Medical History:  Diagnosis Date   Adenomatous colon polyp 1997   Anemia, unspecified    Celiac disease    Chondromalacia of patella    Colon polyps    HX OF   Complication of anesthesia    Diabetes mellitus ([place]    type 2   Disorders of bursae and tendons in shoulder region, unspecified    Diverticulosis    Elevated anti-tissue transglutaminase (tTG) IgA level    Gastric polyps    GERD (gastroesophageal reflux disease)    Hashimoto's disease    Headache(784.0)    Irritable bowel syndrome    postprandial abdominal pain and cramping   Lateral epicondylitis  of elbow    FOOT   Nausea    Neuralgia, neuritis, and radiculitis, unspecified    Obesity, unspecified    Palpitations    Persistent disorder of initiating or maintaining sleep    Personal history of tobacco use, presenting hazards to health    PONV (postoperative nausea and vomiting)    Pure hypercholesterolemia    Restless legs syndrome (RLS)    Seizures ([facility]    HX OF AT AGE 5, EPILEPSY, 4 GRAND MALL SEISURES. 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    Unspecified essential hypertension    CONTROLLED ON MEDS   Unspecified hypothyroidism    Unspecified polyarthropathy or polyarthritis, multiple sites    Variants of migraine, not elsewhere classified, without mention of intractable migraine without mention of status migrainosus    Wears contact lenses    left eye   Past Surgical History:  Procedure Laterality Date   ABD [place] 12/2002    Fatty liver   ABD [place] 05/2010   Fatty liver, normal gall bladder and pancreas   ABDOMINOPLASTY     BREAST BIOPSY Right 1992   excisional negative biopsy   CESAREAN SECTION     x2   COLONOSCOPY  12/2001   Negative   ESOPHAGOGASTRODUODENOSCOPY  12/2001   ESOPHAGOGASTRODUODENOSCOPY  11/2007   nml   ESOPHAGOGASTRODUODENOSCOPY N/A 12/16/2015   Procedure: ESOPHAGOGASTRODUODENOSCOPY (EGD);  Surgeon: PHulen Luster MD;  [facility]  Service: Gastroenterology;  Laterality: N/A;  DIABETIC-ORAL MED   exercise treadmill  5/13   normal   KNEE BURSECTOMY Left 07/11/2017   Procedure: HIP OPEN TROCHANTERIC  BURSECTOMY  with IT Band realse.;  Surgeon: PLeim Fabry MD;  [facility]  Service: Orthopedics;  Laterality: Left;  Diabetic - insulin and oral meds   POLYPECTOMY  12/2001   POPLITEAL SYNOVIAL CYST EXCISION  12/2003   REDUCTION MAMMAPLASTY Bilateral 1991   TONSILLECTOMY     TOOTH EXTRACTION  1978   root canal x3    Allergies  Allergies  Allergen Reactions   Semaglutide Other (See Comments)    Other reaction(s): Other (See Comments) Pancreatitis Pancreatitis    Carbamazepine     REACTION: hives   Cycloset [Bromocriptine] Other (See Comments)    Severe headache   Diltiazem Hcl Other (See Comments)  REACTION: chills, fever   Lisinopril Cough   Metformin And Related Other (See Comments)    GI pain / TIME RELEASED IS OK   Nifedipine Swelling    Lower extremity edema   Verapamil Other (See Comments)    REACTION: edema    History of Present Illness    Sarah Chaney was initially last seen by Dr. Rockey Situ on 04/13/2020 to establish care.  She was previously followed by Dr. Ubaldo Glassing at [facility].  She was initially seen on 12/2015 for complaint of palpitations.  Patient had 48-hour Holter monitor placed that revealed predominant sinus rhythm with rare PVCs and PACs.  She was seen again on 01/05/2016 for complaint of atypical chest pain.  No ischemia noted on EKG  and patient underwent stress echo that was normal with EF>55%.  Patient continued to have complaints of tachycardia and SOB.   During her initial visit with Dr. Rockey Situ she was still endorsing complaints of paroxysmal tachycardia. Her metoprolol succinate was increased to 100 mg daily and her metoprolol tartrate was increased to 50 mg twice daily as needed for breakthrough palpitations.  She was also offered CT coronary calcium scoring to evaluate risk ratification.  She was scheduled for follow-up with Dr. Rockey Situ in 2022 that was missed and is overdue.  Since last being seen in the office patient reports since her last office visit she reports that she has had gastric bypass surgery and is currently 55 pounds lighter.  She is no longer on her antidiabetic medication regimen and is also no longer taking her losartan for blood pressure.  Blood pressure today in office was 120/70 with heart rate of 65.  She states that since her last appointment her mother has suffered a heart attack and has had bypass surgery and aortic valve replacement and is now currently experiencing his CHF.  She states that her palpitations have become more noticeable and she is finding that they are more frequent throughout the day.  She is also having complaints of chest discomfort and pain that radiates to the neck and shoulders.  She denies any dyspnea or syncope with these symptoms.  She does state that she has been taking her metoprolol tartrate 50 mg once daily and says that she reduce that dose on her own.  Patient denies chest pain, palpitations, dyspnea, PND, orthopnea, nausea, vomiting, dizziness, syncope, edema, weight gain, or early satiety. 3 Home Medications    Current Outpatient Medications  Medication Sig Dispense Refill   aspirin 81 MG EC tablet Take by mouth daily.     BAYER MICROLET LANCETS lancets Use to test blood sugar 4 times daily as instructed. 200 each 10   Blood Glucose Monitoring Suppl (BAYER CONTOUR NEXT  MONITOR) W/DEVICE KIT Use to test blood sugar 4 times daily. 1 kit 0   clobetasol cream (TEMOVATE) 0.05 % as needed.     ferrous sulfate 325 (65 FE) MG tablet Take by mouth daily.     frovatriptan (FROVA) 2.5 MG tablet Take by mouth as needed.     glucose blood (BAYER CONTOUR NEXT TEST) test strip Use to test blood sugar 4 times daily as instructed. 125 each 11   levothyroxine (SYNTHROID) 137 MCG tablet Take by mouth daily at 6 (six) AM.     metoprolol tartrate (LOPRESSOR) 100 MG tablet Take 1 tablet (100 mg total) by mouth as directed. Take 2 hours before testing 1 tablet 0   nortriptyline (PAMELOR) 10 MG capsule Take 20 mg by mouth daily.  pantoprazole (PROTONIX) 40 MG tablet Take 40 mg by mouth daily.     pregabalin (LYRICA) 100 MG capsule Take 100 mg by mouth daily.     rosuvastatin (CRESTOR) 5 MG tablet Take 5 mg by mouth daily.     tretinoin (RETIN-A) 0.025 % gel Apply topically once a week.     triamcinolone cream (KENALOG) 0.5 % Apply topically as needed. breakouts     zolpidem (AMBIEN) 10 MG tablet TAKE ONE TABLET BY MOUTH AT BEDTIME AS NEEDED FOR SLEEP 30 tablet 3   metoprolol tartrate (LOPRESSOR) 50 MG tablet Take 1 tablet (50 mg total) by mouth 2 (two) times daily. 180 tablet 3   No current facility-administered medications for this visit.     Review of Systems  Please see the history of present illness.    (+) Palpitations (+) Chest discomfort that radiates to neck  All other systems reviewed and are otherwise negative except as noted above.  Physical Exam    Wt Readings from Last 3 Encounters:  12/17/21 169 lb 3.2 oz (76.7 kg)  04/13/20 213 lb (96.6 kg)  07/28/18 218 lb (98.9 kg)   VS: Vitals:   12/17/21 1345  BP: 120/70  Pulse: 65  SpO2: 96%  ,Body mass index is 26.5 kg/m.  Constitutional:      Appearance: Healthy appearance. Not in distress.  Neck:     Vascular: JVD normal.  Pulmonary:     Effort: Pulmonary effort is normal.     Breath sounds: No  wheezing. No rales. Diminished in the bases Cardiovascular:     Normal rate. Regular rhythm. Normal S1. Normal S2.      Murmurs: There is no murmur.  Edema:    Peripheral edema absent.  Abdominal:     Palpations: Abdomen is soft non tender. There is no hepatomegaly.  Skin:    General: Skin is warm and dry.  Neurological:     General: No focal deficit present.     Mental Status: Alert and oriented to person, place and time.     Cranial Nerves: Cranial nerves are intact.  EKG/LABS/Other Studies Reviewed    ECG personally reviewed by me today -sinus rhythm with rate of 65 with no acute changes.  Lab Results  Component Value Date   WBC 8.4 08/15/2021   HGB 13.4 08/15/2021   HCT 38.8 08/15/2021   MCV 85.5 08/15/2021   PLT 195 08/15/2021   Lab Results  Component Value Date   CREATININE 0.53 08/15/2021   BUN 14 08/15/2021   NA 136 08/15/2021   K 4.6 08/15/2021   CL 104 08/15/2021   CO2 24 08/15/2021   Lab Results  Component Value Date   ALT 66 (H) 07/28/2018   AST 57 (H) 07/28/2018   ALKPHOS 92 07/28/2018   BILITOT 0.5 07/28/2018   Lab Results  Component Value Date   CHOL 216 (H) 05/13/2015   HDL 51.60 05/13/2015   LDLCALC 141 (H) 05/13/2015   LDLDIRECT 138.8 06/29/2011   TRIG 121.0 05/13/2015   CHOLHDL 4 05/13/2015    Lab Results  Component Value Date   HGBA1C 7.5 06/11/2015    Assessment & Plan    1.Palpitations: -Patient states that her palpitations are occurring more frequently and that she is able to feel sensation in her throat and when she takes a deep breath.  She states these occur at any time with rest or with activity. -14-day ZIO monitor to evaluate burden of palpitations and to rule out  atrial ventricular arrhythmia. -We will increase her metoprolol to 50 mg twice daily BMET, CBC, TSH, magnesium today -If patient's ZIO monitor returns high burden we may pursue 2D echo to rule out structural heart disease  2.  Precordial chest pain: -Patient  reports chest pain that occurs with and without exertion that radiates to the neck and shoulder area has been occurring for the last 4 to 6 months. -Patient also reports that her mother recently underwent coronary bypass and aortic valve replacement and is now experiencing congestive heart failure. -Based on patient's family history and current symptoms it is reasonable to pursue coronary CTA at this time to rule out any ischemic etiology for chest pain.  3.Essential HTN: -Patient's blood pressure today is well controlled at 120/70.  She states that she is no longer taking her losartan potassium 50 mg daily -Continue metoprolol tartrate 50 mg twice daily  4.Pure Hypercholesteremia: -Last LDL cholesterol was 114 on 08/2020 above goal of less than 100 -Continue Crestor 5 mg daily with plans to increase based on findings of cardiac CTA.  5. Obesity: -Patient has gastric bypass surgery last year 55 pound weight loss and current BMI of 26.50 kg/m -Continue weight loss and increase physical activity as tolerated  Disposition: Follow-up with Sarah Rogue, MD or APP in 2 months    Medication Adjustments/Labs and Tests Ordered: Current medicines are reviewed at length with the patient today.  Concerns regarding medicines are outlined above.   Signed, Mable Fill, Marissa Nestle, NP 12/17/2021, 2:47 PM [place]

## 2021-12-17 ENCOUNTER — Encounter: Payer: Self-pay | Admitting: Nurse Practitioner

## 2021-12-17 ENCOUNTER — Ambulatory Visit (INDEPENDENT_AMBULATORY_CARE_PROVIDER_SITE_OTHER): Payer: BC Managed Care – PPO

## 2021-12-17 ENCOUNTER — Ambulatory Visit: Payer: BC Managed Care – PPO | Admitting: Nurse Practitioner

## 2021-12-17 ENCOUNTER — Other Ambulatory Visit
Admission: RE | Admit: 2021-12-17 | Discharge: 2021-12-17 | Disposition: A | Discharge status: Home / Self Care | Payer: BC Managed Care – PPO | Source: Ambulatory Visit | Attending: Nurse Practitioner | Admitting: Nurse Practitioner

## 2021-12-17 VITALS — BP 120/70 | HR 65 | Ht 67.0 in | Wt 169.2 lb

## 2021-12-17 DIAGNOSIS — R002 Palpitations: Secondary | ICD-10-CM | POA: Insufficient documentation

## 2021-12-17 DIAGNOSIS — I10 Essential (primary) hypertension: Secondary | ICD-10-CM | POA: Insufficient documentation

## 2021-12-17 DIAGNOSIS — R072 Precordial pain: Secondary | ICD-10-CM | POA: Insufficient documentation

## 2021-12-17 DIAGNOSIS — Z6830 Body mass index (BMI) 30.0-30.9, adult: Secondary | ICD-10-CM

## 2021-12-17 DIAGNOSIS — E6609 Other obesity due to excess calories: Secondary | ICD-10-CM

## 2021-12-17 DIAGNOSIS — E7800 Pure hypercholesterolemia, unspecified: Secondary | ICD-10-CM | POA: Diagnosis not present

## 2021-12-17 LAB — BASIC METABOLIC PANEL
Anion gap: 7 (ref 5–15)
BUN: 16 mg/dL (ref 8–23)
CO2: 29 mmol/L (ref 22–32)
Calcium: 9.5 mg/dL (ref 8.9–10.3)
Chloride: 101 mmol/L (ref 98–111)
Creatinine, Ser: 0.56 mg/dL (ref 0.44–1.00)
GFR, Estimated: >60 mL/min (ref >60)
Glucose, Bld: 112 mg/dL — ABNORMAL HIGH (ref 70–99)
Potassium: 3.9 mmol/L (ref 3.5–5.1)
Sodium: 137 mmol/L (ref 135–145)

## 2021-12-17 LAB — TSH: TSH: 0.487 u[IU]/mL (ref 0.350–4.500)

## 2021-12-17 LAB — MAGNESIUM: Magnesium: 1.9 mg/dL (ref 1.7–2.4)

## 2021-12-17 LAB — CBC
HCT: 42.4 % (ref 36.0–46.0)
Hemoglobin: 13.8 g/dL (ref 12.0–15.0)
MCH: 28 pg (ref 26.0–34.0)
MCHC: 32.5 g/dL (ref 30.0–36.0)
MCV: 86 fL (ref 80.0–100.0)
Platelets: 162 10*3/uL (ref 150–400)
RBC: 4.93 MIL/uL (ref 3.87–5.11)
RDW: 12.8 % (ref 11.5–15.5)
WBC: 6.9 10*3/uL (ref 4.0–10.5)
nRBC: 0 % (ref 0.0–0.2)

## 2021-12-17 MED ORDER — METOPROLOL TARTRATE 100 MG PO TABS: 100.0000 mg | ORAL_TABLET | ORAL | 0 refills | Status: DC

## 2021-12-17 MED ORDER — METOPROLOL TARTRATE 50 MG PO TABS: 50.0000 mg | ORAL_TABLET | Freq: Two times a day (BID) | ORAL | 3 refills | Status: DC

## 2021-12-17 NOTE — Patient Instructions (Addendum)
Medication Instructions:  Your physician has recommended you make the following change in your medication:   TAKE Metoprolol tartrate 50 mg twice a day  *If you need a refill on your cardiac medications before your next appointment, please call your pharmacy*   Lab Work: CBC, TSH, BMET, and Mag today  If you have labs (blood work) drawn today and your tests are completely normal, you will receive your results only by: Ord (if you have MyChart) OR A paper copy in the mail If you have any lab test that is abnormal or we need to change your treatment, we will call you to review the results.   Testing/Procedures:   Your cardiac CT is scheduled at the below location:    [facility] [place], [place] [address] [phone]  Scheduled for January 03, 2022 at 08:00 am with arrival time of 07:45 am  If scheduled at [facility], please arrive 15 mins early for check-in and test prep.  Please follow these instructions carefully (unless otherwise directed):  Hold all erectile dysfunction medications at least 3 days (72 hrs) prior to test.  On the Night Before the Test: Be sure to Drink plenty of water. Do not consume any caffeinated/decaffeinated beverages or chocolate 12 hours prior to your test. Do not take any antihistamines 12 hours prior to your test.   On the Day of the Test: Drink plenty of water until 1 hour prior to the test. Do not eat any food 4 hours prior to the test. You may take your regular medications prior to the test.  Take metoprolol (Lopressor) 100 mg two hours prior to test. HOLD Furosemide/Hydrochlorothiazide morning of the test. FEMALES- please wear underwire-free bra if available, avoid dresses & tight clothing       After the Test: Drink plenty of water. After receiving IV contrast, you may experience a mild flushed feeling. This is normal. On occasion,  you may experience a mild rash up to 24 hours after the test. This is not dangerous. If this occurs, you can take Benadryl 25 mg and increase your fluid intake. If you experience trouble breathing, this can be serious. If it is severe call 911 IMMEDIATELY. If it is mild, please call our office. If you take any of these medications: Glipizide/Metformin, Avandament, Glucavance, please do not take 48 hours after completing test unless otherwise instructed.  We will call to schedule your test 2-4 weeks out understanding that some insurance companies will need an authorization prior to the service being performed.   For non-scheduling related questions, please contact the cardiac imaging nurse navigator should you have any questions/concerns: Marchia Bond, Cardiac Imaging Nurse Navigator Gordy Clement, Cardiac Imaging Nurse Navigator [facility] Direct Office Dial: [phone]   For scheduling needs, including cancellations and rescheduling, please call [place], [phone].  Your provider has ordered a heart monitor to wear for 14 days. This will be mailed to your home with instructions on placement. Once you have finished the time frame requested, you will return monitor in box provided.     Follow-Up: At [facility], you and your health needs are our priority.  As part of our continuing mission to provide you with exceptional heart care, we have created designated Provider Care Teams.  These Care Teams include your primary Cardiologist (physician) and Advanced Practice Providers (APPs -  Physician Assistants and Nurse Practitioners) who all work together to provide you with  the care you need, when you need it.   Your next appointment:   2 month(s)  The format for your next appointment:   In Person  Provider:   Ida Rogue, MD or Murray Hodgkins, NP       Important Information About Sugar

## 2021-12-21 DIAGNOSIS — R002 Palpitations: Secondary | ICD-10-CM | POA: Diagnosis not present

## 2021-12-22 ENCOUNTER — Encounter: Payer: Self-pay | Admitting: *Deleted

## 2021-12-22 ENCOUNTER — Other Ambulatory Visit: Payer: Self-pay | Admitting: Nurse Practitioner

## 2021-12-22 ENCOUNTER — Telehealth: Payer: Self-pay | Admitting: *Deleted

## 2021-12-22 NOTE — Telephone Encounter (Signed)
Left voicemail message to call back for review of results.  

## 2021-12-22 NOTE — Telephone Encounter (Signed)
-----   Message from Mila Merry, RN sent at 12/21/2021  7:14 AM EDT -----  ----- Message ----- From: Marylu Lund., NP Sent: 12/18/2021   9:58 AM EDT To: Rebeca Alert [facility] Triage  Good Morning,  Please let Ms. Insley know that her magnesium,thyroid function and blood work are normal.  These findings are reassuring that her palpitations are not caused by  fluid electrolyte issue or uncontrolled thyroid hormone.  Please continue with scheduled test and we will discuss findings at follow-up.  Thank you,  Ambrose Pancoast, NP

## 2021-12-23 NOTE — Telephone Encounter (Signed)
Left voicemail message that results are available via My Chart and to please call back if any questions.

## 2021-12-31 ENCOUNTER — Telehealth (HOSPITAL_COMMUNITY): Payer: Self-pay | Admitting: *Deleted

## 2021-12-31 NOTE — Telephone Encounter (Signed)
Attempted to call patient regarding upcoming cardiac CT appointment. °Left message on voicemail with name and callback number ° °Doye Montilla RN Navigator Cardiac Imaging °[facility] °[phone] Office °[phone] Cell ° °

## 2022-01-03 ENCOUNTER — Ambulatory Visit
Admission: RE | Admit: 2022-01-03 | Discharge: 2022-01-03 | Disposition: A | Discharge status: Home / Self Care | Payer: BC Managed Care – PPO | Source: Ambulatory Visit | Attending: Nurse Practitioner | Admitting: Nurse Practitioner

## 2022-01-03 DIAGNOSIS — R072 Precordial pain: Secondary | ICD-10-CM | POA: Insufficient documentation

## 2022-01-03 MED ORDER — IOHEXOL 350 MG/ML SOLN
75.0000 mL | Freq: Once | INTRAVENOUS | Status: AC | PRN
  Administered 2022-01-03: 75 mL via INTRAVENOUS

## 2022-01-03 MED ORDER — NITROGLYCERIN 0.4 MG SL SUBL
0.4000 mg | SUBLINGUAL_TABLET | Freq: Once | SUBLINGUAL | Status: AC
  Administered 2022-01-03: 0.4 mg via SUBLINGUAL

## 2022-01-03 MED ORDER — NITROGLYCERIN 0.4 MG SL SUBL: 0.8000 mg | SUBLINGUAL_TABLET | Freq: Once | SUBLINGUAL | Status: DC

## 2022-01-03 NOTE — Progress Notes (Signed)
Patient tolerated procedure well. Ambulate w/o difficulty. Denies light headedness or being dizzy. Sitting in chair drinking water provided. Encouraged to drink extra water today and reasoning explained. Verbalized understanding. All questions answered. ABC intact. No further needs. Discharge from procedure area w/o issues.   °

## 2022-01-13 ENCOUNTER — Encounter: Payer: Self-pay | Admitting: Cardiovascular Disease

## 2022-01-19 ENCOUNTER — Telehealth: Payer: Self-pay | Admitting: *Deleted

## 2022-01-19 NOTE — Telephone Encounter (Signed)
Left voicemail message to call back for review of her results.   Sarah Gianotti, NP   19 hours ago (5:02 PM)    Prelim review of findings shows predominantly sinus rhythm w/ 4 brief runs of tachycardia stemming from the top chambers, though these were very short-lived, the longest accounting for only 9 beats.  Triggered events do appear to be associated with these brief tachycardias.  There were also isolated extra beats from the top and bottom chambers but no sustained arrhythmias.  I recommend continuing current dose of metoprolol, which was increased at her most recent clinic visit.  In the absence of sustained arrhythmias, there is no indication at this time for ablative procedures.

## 2022-01-19 NOTE — Telephone Encounter (Signed)
Pt returning nurses call. Pt states that the results can be put in her MyChart also. Please advise

## 2022-01-19 NOTE — Telephone Encounter (Signed)
Reviewed results and recommendations with patient. She verbalized understanding with no further questions at this time.  ?

## 2022-02-25 ENCOUNTER — Ambulatory Visit: Payer: BC Managed Care – PPO | Admitting: Cardiovascular Disease

## 2022-12-07 ENCOUNTER — Other Ambulatory Visit: Payer: Self-pay | Admitting: Nurse Practitioner

## 2022-12-07 NOTE — Telephone Encounter (Signed)
Left voice mail to schedule appt

## 2022-12-07 NOTE — Telephone Encounter (Signed)
Please schedule overdue F/U appointment for further refills with Dr. Mariah Milling or Ward Givens, NP. Thank you!

## 2022-12-08 NOTE — Telephone Encounter (Signed)
Left voicemail to schedule follow up appointment

## 2022-12-12 ENCOUNTER — Encounter: Payer: Self-pay | Admitting: Cardiovascular Disease

## 2022-12-12 NOTE — Telephone Encounter (Signed)
Unable to contact letter mailed to patient.

## 2022-12-12 NOTE — Telephone Encounter (Signed)
Sending 30 day supply with request for pt to contact office to schedule f/u prior to further refills.

## 2023-01-06 ENCOUNTER — Other Ambulatory Visit: Payer: Self-pay | Admitting: Nurse Practitioner

## 2023-02-06 ENCOUNTER — Other Ambulatory Visit: Payer: Self-pay | Admitting: Nurse Practitioner

## 2023-02-21 ENCOUNTER — Other Ambulatory Visit: Payer: Self-pay | Admitting: Nurse Practitioner

## 2023-02-21 ENCOUNTER — Telehealth: Payer: Self-pay | Admitting: Cardiovascular Disease

## 2023-02-21 NOTE — Telephone Encounter (Signed)
Left voicemail. Pt is overdue for 12 mo follow up appt.

## 2023-02-21 NOTE — Telephone Encounter (Signed)
Left voice mail to schedule appt

## 2023-02-21 NOTE — Telephone Encounter (Signed)
Please contact pt for future appointment. Pt overdue for 2 month f/u last seen 11/2021.

## 2023-02-23 ENCOUNTER — Telehealth: Payer: Self-pay | Admitting: Cardiovascular Disease

## 2023-02-23 NOTE — Telephone Encounter (Signed)
Left voice mail to schedule appt

## 2023-03-01 ENCOUNTER — Encounter: Payer: Self-pay | Admitting: Cardiovascular Disease

## 2023-03-01 NOTE — Telephone Encounter (Signed)
Called for the 3rd time, unable to reach letter being sent to patient via mail

## 2023-04-16 ENCOUNTER — Other Ambulatory Visit: Payer: Self-pay | Admitting: Nurse Practitioner

## 2023-04-17 ENCOUNTER — Telehealth: Payer: Self-pay | Admitting: Cardiovascular Disease

## 2023-04-17 NOTE — Telephone Encounter (Signed)
Overdue for yearly f/u.  Please schedule.  Thanks

## 2023-04-17 NOTE — Telephone Encounter (Signed)
Left voicemail, pt is overdue for 12 mo f/u appt

## 2023-04-18 NOTE — Telephone Encounter (Signed)
Left voice mail

## 2023-04-19 ENCOUNTER — Encounter: Payer: Self-pay | Admitting: Cardiovascular Disease

## 2023-04-19 NOTE — Telephone Encounter (Signed)
Unable to reach letter sent via mail.

## 2024-05-20 ENCOUNTER — Other Ambulatory Visit: Payer: Self-pay

## 2024-05-20 MED ORDER — PREDNISONE 10 MG PO TABS
ORAL_TABLET | ORAL | 0 refills | Status: AC
  Filled 2024-05-20: qty 18, 9d supply, fill #0

## 2024-05-29 ENCOUNTER — Other Ambulatory Visit: Payer: Self-pay

## 2024-05-29 MED ORDER — CIPROFLOXACIN HCL 500 MG PO TABS
500.0000 mg | ORAL_TABLET | Freq: Two times a day (BID) | ORAL | 0 refills | Status: DC
  Filled 2024-05-29: qty 14, 7d supply, fill #0

## 2024-05-29 MED ORDER — HYDROCODONE-ACETAMINOPHEN 5-325 MG PO TABS
1.0000 | ORAL_TABLET | Freq: Four times a day (QID) | ORAL | 0 refills | Status: DC | PRN
  Filled 2024-05-29: qty 12, 3d supply, fill #0

## 2024-05-29 MED ORDER — ONDANSETRON 4 MG PO TBDP
4.0000 mg | ORAL_TABLET | Freq: Three times a day (TID) | ORAL | 0 refills | Status: DC | PRN
  Filled 2024-05-29: qty 20, 7d supply, fill #0

## 2024-06-13 ENCOUNTER — Other Ambulatory Visit: Payer: Self-pay

## 2024-06-13 MED ORDER — ROSUVASTATIN CALCIUM 10 MG PO TABS: 10.0000 mg | ORAL_TABLET | Freq: Every day | ORAL | 3 refills | Status: AC

## 2024-06-13 MED ORDER — ZOLPIDEM TARTRATE 5 MG PO TABS
5.0000 mg | ORAL_TABLET | Freq: Every evening | ORAL | 1 refills | Status: AC | PRN
  Filled 2024-06-25: qty 90, 90d supply, fill #0

## 2024-06-13 MED ORDER — OMEPRAZOLE 20 MG PO CPDR
20.0000 mg | DELAYED_RELEASE_CAPSULE | Freq: Every day | ORAL | 3 refills | Status: AC
  Filled 2024-06-25: qty 90, 90d supply, fill #0

## 2024-06-13 MED ORDER — PREGABALIN 100 MG PO CAPS
100.0000 mg | ORAL_CAPSULE | Freq: Every day | ORAL | 3 refills | Status: DC
  Filled 2024-06-13 – 2024-06-17 (×2): qty 90, 90d supply, fill #0

## 2024-06-13 MED ORDER — LEVOTHYROXINE SODIUM 137 MCG PO TABS
ORAL_TABLET | ORAL | 2 refills | Status: AC
  Filled 2024-06-13: qty 100, 83d supply, fill #0

## 2024-06-15 ENCOUNTER — Emergency Department

## 2024-06-15 ENCOUNTER — Other Ambulatory Visit: Payer: Self-pay

## 2024-06-15 ENCOUNTER — Emergency Department
Admission: EM | Admit: 2024-06-15 | Discharge: 2024-06-15 | Disposition: A | Discharge status: Home / Self Care | Attending: Emergency Medicine | Admitting: Emergency Medicine

## 2024-06-15 DIAGNOSIS — I10 Essential (primary) hypertension: Secondary | ICD-10-CM | POA: Diagnosis not present

## 2024-06-15 DIAGNOSIS — Z7982 Long term (current) use of aspirin: Secondary | ICD-10-CM | POA: Insufficient documentation

## 2024-06-15 DIAGNOSIS — R079 Chest pain, unspecified: Secondary | ICD-10-CM | POA: Diagnosis present

## 2024-06-15 DIAGNOSIS — R911 Solitary pulmonary nodule: Secondary | ICD-10-CM | POA: Insufficient documentation

## 2024-06-15 DIAGNOSIS — I3139 Other pericardial effusion (noninflammatory): Secondary | ICD-10-CM | POA: Diagnosis not present

## 2024-06-15 DIAGNOSIS — E119 Type 2 diabetes mellitus without complications: Secondary | ICD-10-CM | POA: Insufficient documentation

## 2024-06-15 DIAGNOSIS — R918 Other nonspecific abnormal finding of lung field: Secondary | ICD-10-CM

## 2024-06-15 DIAGNOSIS — Z79899 Other long term (current) drug therapy: Secondary | ICD-10-CM | POA: Insufficient documentation

## 2024-06-15 DIAGNOSIS — R1013 Epigastric pain: Secondary | ICD-10-CM | POA: Insufficient documentation

## 2024-06-15 LAB — CBC WITH DIFFERENTIAL/PLATELET
Abs Immature Granulocytes: 0.02 K/uL (ref 0.00–0.07)
Basophils Absolute: 0.1 K/uL (ref 0.0–0.1)
Basophils Relative: 1 %
Eosinophils Absolute: 0.2 K/uL (ref 0.0–0.5)
Eosinophils Relative: 3 %
HCT: 36.6 % (ref 36.0–46.0)
Hemoglobin: 12.4 g/dL (ref 12.0–15.0)
Immature Granulocytes: 0 %
Lymphocytes Relative: 41 %
Lymphs Abs: 2.8 K/uL (ref 0.7–4.0)
MCH: 28.4 pg (ref 26.0–34.0)
MCHC: 33.9 g/dL (ref 30.0–36.0)
MCV: 83.9 fL (ref 80.0–100.0)
Monocytes Absolute: 0.6 K/uL (ref 0.1–1.0)
Monocytes Relative: 9 %
Neutro Abs: 3.1 K/uL (ref 1.7–7.7)
Neutrophils Relative %: 46 %
Platelets: 173 K/uL (ref 150–400)
RBC: 4.36 MIL/uL (ref 3.87–5.11)
RDW: 12.8 % (ref 11.5–15.5)
WBC: 6.8 K/uL (ref 4.0–10.5)
nRBC: 0 % (ref 0.0–0.2)

## 2024-06-15 LAB — LIPASE, BLOOD: Lipase: 36 U/L (ref 11–51)

## 2024-06-15 LAB — TROPONIN T, HIGH SENSITIVITY
Troponin T High Sensitivity: <15 ng/L (ref 0–19)
Troponin T High Sensitivity: <15 ng/L (ref 0–19)

## 2024-06-15 LAB — COMPREHENSIVE METABOLIC PANEL WITH GFR
ALT: 42 U/L (ref 0–44)
AST: 100 U/L — ABNORMAL HIGH (ref 15–41)
Albumin: 4.4 g/dL (ref 3.5–5.0)
Alkaline Phosphatase: 114 U/L (ref 38–126)
Anion gap: 10 (ref 5–15)
BUN: 12 mg/dL (ref 8–23)
CO2: 26 mmol/L (ref 22–32)
Calcium: 9.7 mg/dL (ref 8.9–10.3)
Chloride: 104 mmol/L (ref 98–111)
Creatinine, Ser: 0.54 mg/dL (ref 0.44–1.00)
GFR, Estimated: >60 mL/min (ref >60)
Glucose, Bld: 130 mg/dL — ABNORMAL HIGH (ref 70–99)
Potassium: 3.9 mmol/L (ref 3.5–5.1)
Sodium: 140 mmol/L (ref 135–145)
Total Bilirubin: 0.4 mg/dL (ref 0.0–1.2)
Total Protein: 6.7 g/dL (ref 6.5–8.1)

## 2024-06-15 LAB — T4, FREE: Free T4: 1.76 ng/dL — ABNORMAL HIGH (ref 0.61–1.12)

## 2024-06-15 LAB — TSH: TSH: 0.105 u[IU]/mL — ABNORMAL LOW (ref 0.350–4.500)

## 2024-06-15 MED ORDER — ALUM & MAG HYDROXIDE-SIMETH 200-200-20 MG/5ML PO SUSP
30.0000 mL | Freq: Once | ORAL | Status: AC
  Administered 2024-06-15: 30 mL via ORAL
  Filled 2024-06-15: qty 30

## 2024-06-15 MED ORDER — IOHEXOL 350 MG/ML SOLN
100.0000 mL | Freq: Once | INTRAVENOUS | Status: AC | PRN
  Administered 2024-06-15: 100 mL via INTRAVENOUS

## 2024-06-15 MED ORDER — MORPHINE SULFATE (PF) 4 MG/ML IV SOLN
4.0000 mg | INTRAVENOUS | Status: DC | PRN
  Administered 2024-06-15: 4 mg via INTRAVENOUS
  Filled 2024-06-15: qty 1

## 2024-06-15 MED ORDER — ASPIRIN 81 MG PO CHEW
162.0000 mg | CHEWABLE_TABLET | Freq: Once | ORAL | Status: AC
  Administered 2024-06-15: 162 mg via ORAL
  Filled 2024-06-15: qty 2

## 2024-06-15 MED ORDER — PANTOPRAZOLE SODIUM 40 MG IV SOLR
80.0000 mg | Freq: Once | INTRAVENOUS | Status: AC
  Administered 2024-06-15: 80 mg via INTRAVENOUS
  Filled 2024-06-15: qty 20

## 2024-06-15 MED ORDER — LIDOCAINE VISCOUS HCL 2 % MT SOLN
15.0000 mL | Freq: Once | OROMUCOSAL | Status: AC
  Administered 2024-06-15: 15 mL via ORAL
  Filled 2024-06-15: qty 15

## 2024-06-15 NOTE — ED Triage Notes (Addendum)
 Pt presents for mid sternal chest pain radiating to the upper back. Denies nausea and vomiting. Endorsing lots of belching but has since resolved. Endorsing mild Shortness of breath and anxiety. Pain started while watching TV. No agrevating or alleviating factors.   Past Medical History:  Diagnosis Date   Adenomatous colon polyp 1997   Anemia, unspecified    Celiac disease    Chondromalacia of patella    Colon polyps    HX OF   Complication of anesthesia    Diabetes mellitus (HCC)    type 2   Disorders of bursae and tendons in shoulder region, unspecified    Diverticulosis    Elevated anti-tissue transglutaminase (tTG) IgA level    Gastric polyps    GERD (gastroesophageal reflux disease)    Hashimoto's disease    Headache(784.0)    Irritable bowel syndrome    postprandial abdominal pain and cramping   Lateral epicondylitis  of elbow    FOOT   Nausea    Neuralgia, neuritis, and radiculitis, unspecified    Obesity, unspecified    Palpitations    Persistent disorder of initiating or maintaining sleep    Personal history of tobacco use, presenting hazards to health    PONV (postoperative nausea and vomiting)    Pure hypercholesterolemia    Restless legs syndrome (RLS)    Seizures (HCC)    HX OF AT AGE 43, EPILEPSY, 4 GRAND MALL SEISURES. 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

## 2024-06-15 NOTE — ED Provider Notes (Signed)
 [facility] Provider Note    Event Date/Time   First MD Initiated Contact with Patient 06/15/24 0103     (approximate)   History   Chest Pain   HPI  Sarah Chaney is a 63 y.o. female   Past medical history of celiac disease, Hashimoto's thyroiditis, diabetes, IBS, hypertension here with chest pain.  Started tonight while at rest watching TV.  No preceding illnesses trauma or any other accompanying symptoms.  Substernal/epigastric chest pain rating to the upper back.    Quite severe.  Took 2 baby aspirin .    No respiratory symptoms or respiratory infectious symptoms.  No nausea or vomiting.    No significant alcohol or NSAID use.    Independent Historian contributed to assessment above: Partner is at bedside to corroborate information past medical history as above  External Medical Documents Reviewed: Outpatient notes      Physical Exam   Triage Vital Signs: ED Triage Vitals  Encounter Vitals Group     BP 06/15/24 0103 (!) 165/85     Girls Systolic BP Percentile --      Girls Diastolic BP Percentile --      Boys Systolic BP Percentile --      Boys Diastolic BP Percentile --      Pulse Rate 06/15/24 0103 76     Resp 06/15/24 0103 (!) 22     Temp 06/15/24 0103 98.1 F (36.7 C)     Temp Source 06/15/24 0103 Oral     SpO2 06/15/24 0103 100 %     Weight 06/15/24 0102 158 lb (71.7 kg)     Height 06/15/24 0102 5' 7 (1.702 m)     Head Circumference --      Peak Flow --      Pain Score 06/15/24 0102 7     Pain Loc --      Pain Education --      Exclude from Growth Chart --     Most recent vital signs: Vitals:   06/15/24 0103 06/15/24 0105  BP: (!) 165/85 (!) 151/75  Pulse: 76 74  Resp: (!) 22 15  Temp: 98.1 F (36.7 C)   SpO2: 100% 100%    General: Awake, no distress.  CV:  Good peripheral perfusion.  Resp:  Normal effort.  Abd:  No distention.  Other:  Uncomfortable appearing with epigastric tenderness without rigidity or  guarding.  Clear lungs to auscultation no heart murmurs and radial pulses intact and equal bilaterally.  Skin appears warm well-perfused no significant peripheral edema.   ED Results / Procedures / Treatments   Labs (all labs ordered are listed, but only abnormal results are displayed) Labs Reviewed  COMPREHENSIVE METABOLIC PANEL WITH GFR - Abnormal; Notable for the following components:      Result Value   Glucose, Bld 130 (*)    AST 100 (*)    All other components within normal limits  TSH - Abnormal; Notable for the following components:   TSH 0.105 (*)    All other components within normal limits  T4, FREE - Abnormal; Notable for the following components:   Free T4 1.76 (*)    All other components within normal limits  LIPASE, BLOOD  CBC WITH DIFFERENTIAL/PLATELET  TROPONIN T, HIGH SENSITIVITY  TROPONIN T, HIGH SENSITIVITY     I ordered and reviewed the above labs they are notable for cell counts electrolytes and initial troponin unremarkable.  EKG  ED ECG REPORT I, Ginnie  Cyrena, the attending physician, personally viewed and interpreted this ECG.   Date: 06/15/2024  EKG Time: 0104  Rate: 76  Rhythm: sinus  Axis: nl  Intervals:nl  ST&T Change: no stemi    RADIOLOGY I independently reviewed and interpreted chest x-ray and I see no mediastinal widening focality or pneumothorax I also reviewed radiologist's formal read.   PROCEDURES:  Critical Care performed: No  Procedures   MEDICATIONS ORDERED IN ED: Medications  morphine  (PF) 4 MG/ML injection 4 mg (4 mg Intravenous Given 06/15/24 0125)  alum & mag hydroxide-simeth (MAALOX/MYLANTA) 200-200-20 MG/5ML suspension 30 mL (30 mLs Oral Given 06/15/24 0126)    And  lidocaine  (XYLOCAINE ) 2 % viscous mouth solution 15 mL (15 mLs Oral Given 06/15/24 0126)  pantoprazole  (PROTONIX ) injection 80 mg (80 mg Intravenous Given 06/15/24 0126)  aspirin  chewable tablet 162 mg (162 mg Oral Given 06/15/24 0128)  iohexol   (OMNIPAQUE ) 350 MG/ML injection 100 mL (100 mLs Intravenous Contrast Given 06/15/24 0236)     IMPRESSION / MDM / ASSESSMENT AND PLAN / ED COURSE  I reviewed the triage vital signs and the nursing notes.                                Patient's presentation is most consistent with acute presentation with potential threat to life or bodily function.  Differential diagnosis includes, but is not limited to, ACS, dissection, PE, gastritis/GERD/ulcer/perforated viscus, pancreatitis, biliary pathologies   The patient is on the cardiac monitor to evaluate for evidence of arrhythmia and/or significant heart rate changes.  MDM:    Quite severe chest pain radiating to back with hypertension concerning for dissection or ACS.  Get dissection protocol CT angiogram, serial troponins, give the remainder of her full dose aspirin , IV morphine  for pain control, and also considered gastric etiologies will give IV Protonix  and GI cocktail.  Check LFTs and lipase as well.   -- Pain resolved with medications as above.  Patient resting comfortably.  Serial troponins have been flat, rule out cardiac ischemia.  CT angiogram with no emergency findings, small pericardial effusion without any hemodynamic compromise, and no associated aortic syndromes noted, lung nodules incidental finding conveyed to patient for follow-up as needed, and made cardiology referral as well as GI referral.  I considered hospitalization however given resolution of symptoms and no emergency findings from evaluation as above, I think outpatient follow-up with referrals as above as well as PMD is appropriate this time and return precautions were given.     FINAL CLINICAL IMPRESSION(S) / ED DIAGNOSES   Final diagnoses:  Nonspecific chest pain  Lung nodules  Epigastric pain  Pericardial effusion     Rx / DC Orders   ED Discharge Orders          Ordered    Ambulatory referral to Cardiology  Status:  Canceled        06/15/24  0343    Ambulatory referral to Gastroenterology        06/15/24 0343    Ambulatory referral to Cardiology        06/15/24 0343             Note:  This document was prepared using Dragon voice recognition software and may include unintentional dictation errors.    Cyrena Mylar, MD 06/15/24 0400

## 2024-06-15 NOTE — ED Notes (Signed)
 RN to bedside. Pt is very uncomfortable. Pt points to her mid epigastrum for the pain and it is intermittent and radiates into her poster back in between her shoulder blades.

## 2024-06-15 NOTE — Discharge Instructions (Signed)
 Fortunately your evaluation in the emergency department did not show any emergency conditions like tears in the blood vessels, or heart attack to explain your symptoms.  Continue taking the omeprazole  as stomach acid related pains may have contributed to your symptoms tonight.  I made a referral to a gastroenterologist who will call you to schedule a follow-up appointment.  I also made a referral to the cardiologist who should call you to schedule follow-up appointment for your chest pain as well as to address the small pericardial effusion found on your CT scan tonight.  Thank you for choosing us  for your health care today!  Please see your primary doctor this week for a follow up appointment.   If you have any new, worsening, or unexpected symptoms call your doctor right away or come back to the emergency department for reevaluation.  It was my pleasure to care for you today.   Ginnie EDISON Cyrena, MD

## 2024-06-17 ENCOUNTER — Other Ambulatory Visit: Payer: Self-pay

## 2024-06-25 ENCOUNTER — Other Ambulatory Visit: Payer: Self-pay

## 2024-07-02 ENCOUNTER — Other Ambulatory Visit: Payer: Self-pay

## 2024-07-02 MED ORDER — NORTRIPTYLINE HCL 10 MG PO CAPS
20.0000 mg | ORAL_CAPSULE | Freq: Every day | ORAL | 3 refills | Status: DC
  Filled 2024-07-02: qty 180, 90d supply, fill #0

## 2024-07-03 ENCOUNTER — Other Ambulatory Visit: Payer: Self-pay

## 2024-07-11 ENCOUNTER — Other Ambulatory Visit: Payer: Self-pay

## 2024-07-11 MED ORDER — METOPROLOL TARTRATE 50 MG PO TABS
50.0000 mg | ORAL_TABLET | Freq: Two times a day (BID) | ORAL | 3 refills | Status: DC
  Filled 2024-07-11: qty 180, 90d supply, fill #0

## 2024-07-14 ENCOUNTER — Other Ambulatory Visit: Payer: Self-pay

## 2024-07-15 ENCOUNTER — Other Ambulatory Visit: Payer: Self-pay

## 2024-07-16 ENCOUNTER — Other Ambulatory Visit: Payer: Self-pay

## 2024-07-16 MED ORDER — METFORMIN HCL ER 750 MG PO TB24
750.0000 mg | ORAL_TABLET | Freq: Two times a day (BID) | ORAL | 3 refills | Status: AC
  Filled 2024-07-16 (×3): qty 180, 90d supply, fill #0

## 2024-07-17 ENCOUNTER — Other Ambulatory Visit: Payer: Self-pay

## 2024-08-13 ENCOUNTER — Other Ambulatory Visit: Payer: Self-pay

## 2024-08-13 MED ORDER — CIPROFLOXACIN HCL 750 MG PO TABS
750.0000 mg | ORAL_TABLET | Freq: Two times a day (BID) | ORAL | 0 refills | Status: DC
  Filled 2024-08-13: qty 12, 6d supply, fill #0

## 2024-08-27 ENCOUNTER — Encounter: Payer: Self-pay | Admitting: Urology

## 2024-08-27 ENCOUNTER — Ambulatory Visit (INDEPENDENT_AMBULATORY_CARE_PROVIDER_SITE_OTHER): Admitting: Urology

## 2024-08-27 VITALS — BP 127/84 | HR 108 | Ht 67.0 in | Wt 152.0 lb

## 2024-08-27 DIAGNOSIS — R310 Gross hematuria: Secondary | ICD-10-CM

## 2024-08-27 NOTE — Progress Notes (Signed)
 "  08/27/2024 5:52 PM   Sarah Chaney [birth year] [id number]  Referring provider: Celestia Burnard MATSU, FNP [address] [place],  [place] [address]  Chief Complaint  Patient presents with   Hematuria    HPI: Sarah Chaney is a 64 y.o. female referred for evaluation of recurrent gross hematuria.  Over the last 6-9 months she is had 3-4 episodes of gross hematuria associated with lower urinary tract symptoms Symptoms will start as urine appearing light pink in color.  The hematuria progresses and she has had 1 episode where she states she was voiding pure blood.  She subsequently develops significant pain at the end of urination.  She has had negative urine cultures. Has recently had dull left flank pain Prior history of stone disease though a CT angio performed 06/15/2024 showed no urinary tract stones   PMH: Past Medical History:  Diagnosis Date   Adenomatous colon polyp 1997   Anemia, unspecified    Celiac disease    Chondromalacia of patella    Colon polyps    HX OF   Complication of anesthesia    Diabetes mellitus (HCC)    type 2   Disorders of bursae and tendons in shoulder region, unspecified    Diverticulosis    Elevated anti-tissue transglutaminase (tTG) IgA level    Gastric polyps    GERD (gastroesophageal reflux disease)    Hashimoto's disease    Headache(784.0)    Irritable bowel syndrome    postprandial abdominal pain and cramping   Lateral epicondylitis  of elbow    FOOT   Nausea    Neuralgia, neuritis, and radiculitis, unspecified    Obesity, unspecified    Palpitations    Persistent disorder of initiating or maintaining sleep    Personal history of tobacco use, presenting hazards to health    PONV (postoperative nausea and vomiting)    Pure hypercholesterolemia    Restless legs syndrome (RLS)    Seizures (HCC)    HX OF AT AGE 41, EPILEPSY, 4 GRAND MALL SEISURES. 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     Unspecified essential hypertension    CONTROLLED ON MEDS   Unspecified hypothyroidism    Unspecified polyarthropathy or polyarthritis, multiple sites    Variants of migraine, not elsewhere classified, without mention of intractable migraine without mention of status migrainosus    Wears contact lenses    left eye    Surgical History: Past Surgical History:  Procedure Laterality Date   ABD US   12/2002   Fatty liver   ABD US   05/2010   Fatty liver, normal gall bladder and pancreas   ABDOMINOPLASTY     BREAST BIOPSY Right 1992   excisional negative biopsy   CESAREAN SECTION     x2   COLONOSCOPY  12/2001   Negative   ESOPHAGOGASTRODUODENOSCOPY  12/2001   ESOPHAGOGASTRODUODENOSCOPY  11/2007   nml   ESOPHAGOGASTRODUODENOSCOPY N/A 12/16/2015   Procedure: ESOPHAGOGASTRODUODENOSCOPY (EGD);  Surgeon: Deward CINDERELLA Piedmont, MD;  Location: [facility];  Service: Gastroenterology;  Laterality: N/A;  DIABETIC-ORAL MED   exercise treadmill  5/13   normal   KNEE BURSECTOMY Left 07/11/2017   Procedure: HIP OPEN TROCHANTERIC  BURSECTOMY  with IT Band realse.;  Surgeon: Tobie Priest, MD;  Location: [facility];  Service: Orthopedics;  Laterality: Left;  Diabetic - insulin and oral meds   POLYPECTOMY  12/2001   POPLITEAL SYNOVIAL CYST EXCISION  12/2003  REDUCTION MAMMAPLASTY Bilateral 1991   TONSILLECTOMY     TOOTH EXTRACTION  1978   root canal x3    Home Medications:  Allergies as of 08/27/2024       Reactions   Semaglutide Other (See Comments)   Other reaction(s): Other (See Comments) Pancreatitis Pancreatitis   Carbamazepine    REACTION: hives   Cycloset  [bromocriptine ] Other (See Comments)   Severe headache   Diltiazem Hcl Other (See Comments)   REACTION: chills, fever   Lisinopril  Cough   Metformin  And Related Other (See Comments)   GI pain / TIME RELEASED IS OK   Nifedipine Swelling   Lower extremity edema   Verapamil Other (See Comments)   REACTION: edema         Medication List        Accurate as of August 27, 2024  5:52 PM. If you have any questions, ask your nurse or doctor.          STOP taking these medications    HYDROcodone -acetaminophen  5-325 MG tablet Commonly known as: NORCO/VICODIN Stopped by: Glendia Barba, MD   ondansetron  4 MG disintegrating tablet Commonly known as: ZOFRAN -ODT Stopped by: Glendia Barba, MD       TAKE these medications    aspirin  EC 81 MG tablet Take by mouth daily.   Bayer Contour Next Monitor w/Device Kit Use to test blood sugar 4 times daily.   Bayer Microlet Lancets lancets Use to test blood sugar 4 times daily as instructed.   ciprofloxacin  500 MG tablet Commonly known as: CIPRO  Take 1 tablet (500 mg total) by mouth 2 (two) times daily for 7 days What changed: Another medication with the same name was removed. Continue taking this medication, and follow the directions you see here. Changed by: Glendia Barba, MD   clobetasol cream 0.05 % Commonly known as: TEMOVATE as needed.   ferrous sulfate 325 (65 FE) MG tablet Take by mouth daily.   frovatriptan 2.5 MG tablet Commonly known as: FROVA Take by mouth as needed.   glucose blood test strip Commonly known as: Bayer Contour Next Test Use to test blood sugar 4 times daily as instructed.   levothyroxine  137 MCG tablet Commonly known as: SYNTHROID  Take by mouth daily at 6 (six) AM.   levothyroxine  137 MCG tablet Commonly known as: SYNTHROID  Take 1 tablet (137 mcg total) by mouth daily Monday - Saturday AND 1.5 tablets (205.5 mcg total) once a week on sunday.   metFORMIN  750 MG 24 hr tablet Commonly known as: GLUCOPHAGE -XR Take 1 tablet (750 mg total) by mouth 2 (two) times daily.   metoprolol  tartrate 100 MG tablet Commonly known as: LOPRESSOR  Take 1 tablet (100 mg total) by mouth as directed. Take 2 hours before testing What changed: Another medication with the same name was removed. Continue taking this medication, and  follow the directions you see here. Changed by: Glendia Barba, MD   nortriptyline  10 MG capsule Commonly known as: PAMELOR  Take 20 mg by mouth daily. What changed: Another medication with the same name was removed. Continue taking this medication, and follow the directions you see here. Changed by: Glendia Barba, MD   omeprazole  20 MG capsule Commonly known as: PRILOSEC Take 1 capsule (20 mg total) by mouth daily.   pantoprazole  40 MG tablet Commonly known as: PROTONIX  Take 40 mg by mouth daily.   pregabalin  100 MG capsule Commonly known as: LYRICA  Take 100 mg by mouth daily. What changed: Another medication with the same name  was removed. Continue taking this medication, and follow the directions you see here. Changed by: Glendia Barba, MD   rosuvastatin  5 MG tablet Commonly known as: CRESTOR  Take 5 mg by mouth daily.   rosuvastatin  10 MG tablet Commonly known as: CRESTOR  Take 1 tablet (10 mg total) by mouth at bedtime.   triamcinolone cream 0.5 % Commonly known as: KENALOG Apply topically as needed. breakouts   zolpidem  5 MG tablet Commonly known as: AMBIEN  Take 1 tablet (5 mg total) by mouth at bedtime as needed for sleep. What changed: Another medication with the same name was removed. Continue taking this medication, and follow the directions you see here. Changed by: Glendia Barba, MD        Allergies: Allergies[1]  Family History: Family History  Problem Relation Age of Onset   Coronary artery disease Mother    Heart failure Mother    Diabetes Mother    Hyperlipidemia Mother    Thyroid  disease Mother    Diabetes type II Mother    Congestive Heart Failure Mother    Diabetes type II Father    Breast cancer Maternal Aunt 71   Diabetes Other        neice    Social History:  reports that she quit smoking about 12 years ago. Her smoking use included cigarettes. She started smoking about 44 years ago. She has a 9.6 pack-year smoking history. She has  never used smokeless tobacco. She reports that she does not drink alcohol and does not use drugs.   Physical Exam: BP 127/84   Pulse (!) 108   Ht 5' 7 (1.702 m)   Wt 152 lb (68.9 kg)   BMI 23.81 kg/m   Constitutional:  Alert, No acute distress. HEENT: [place] AT Respiratory: Normal respiratory effort, no increased work of breathing. Psychiatric: Normal mood and affect.   Assessment & Plan:    1. Gross hematuria  Recurrent gross hematuria Recommend further evaluation with a CT hematuria study and cystoscopy.  The procedures were discussed and she elects to proceed with further evaluation She was unable to void today and will bring in a specimen at the time of cystoscopy   Glendia JAYSON Barba, MD  [facility] [address], [address] [place], [place] [address] [phone]    [1]  Allergies Allergen Reactions   Semaglutide Other (See Comments)    Other reaction(s): Other (See Comments) Pancreatitis Pancreatitis    Carbamazepine     REACTION: hives   Cycloset  [Bromocriptine ] Other (See Comments)    Severe headache   Diltiazem Hcl Other (See Comments)    REACTION: chills, fever   Lisinopril  Cough   Metformin  And Related Other (See Comments)    GI pain / TIME RELEASED IS OK   Nifedipine Swelling    Lower extremity edema   Verapamil Other (See Comments)    REACTION: edema   "

## 2024-08-27 NOTE — Patient Instructions (Signed)
 Schedule ct [phone]

## 2024-08-28 ENCOUNTER — Other Ambulatory Visit (HOSPITAL_COMMUNITY): Payer: Self-pay

## 2024-08-30 ENCOUNTER — Other Ambulatory Visit: Payer: Self-pay

## 2024-08-30 MED ORDER — PREGABALIN 100 MG PO CAPS: ORAL_CAPSULE | ORAL | 3 refills | Status: AC

## 2024-08-30 MED ORDER — ZOLPIDEM TARTRATE 5 MG PO TABS: ORAL_TABLET | ORAL | 1 refills | Status: AC

## 2024-08-30 MED ORDER — ROSUVASTATIN CALCIUM 10 MG PO TABS
10.0000 mg | ORAL_TABLET | Freq: Every day | ORAL | 3 refills | Status: AC
  Filled 2024-08-30: qty 90, 90d supply, fill #0

## 2024-09-03 ENCOUNTER — Ambulatory Visit

## 2024-10-02 ENCOUNTER — Other Ambulatory Visit: Admitting: Urology
